# Patient Record
Sex: Male | Born: 1945 | Race: White | Hispanic: No | Marital: Married | Smoking: Former smoker
Health system: Southern US, Community
[De-identification: ages and names within clinical notes are randomized; demographics above are authoritative.]

## PROBLEM LIST (undated history)

## (undated) DIAGNOSIS — R0600 Dyspnea, unspecified: Secondary | ICD-10-CM

## (undated) DIAGNOSIS — K8020 Calculus of gallbladder without cholecystitis without obstruction: Secondary | ICD-10-CM

## (undated) DIAGNOSIS — E785 Hyperlipidemia, unspecified: Secondary | ICD-10-CM

## (undated) DIAGNOSIS — I219 Acute myocardial infarction, unspecified: Secondary | ICD-10-CM

## (undated) DIAGNOSIS — L0390 Cellulitis, unspecified: Secondary | ICD-10-CM

## (undated) DIAGNOSIS — I2510 Atherosclerotic heart disease of native coronary artery without angina pectoris: Secondary | ICD-10-CM

## (undated) DIAGNOSIS — C61 Malignant neoplasm of prostate: Secondary | ICD-10-CM

## (undated) DIAGNOSIS — I739 Peripheral vascular disease, unspecified: Secondary | ICD-10-CM

## (undated) DIAGNOSIS — I10 Essential (primary) hypertension: Secondary | ICD-10-CM

## (undated) DIAGNOSIS — K5792 Diverticulitis of intestine, part unspecified, without perforation or abscess without bleeding: Secondary | ICD-10-CM

## (undated) HISTORY — PX: TONSILLECTOMY: SUR1361

## (undated) HISTORY — DX: Essential (primary) hypertension: I10

## (undated) HISTORY — DX: Dyspnea, unspecified: R06.00

## (undated) HISTORY — PX: CHOLECYSTECTOMY: SHX55

## (undated) HISTORY — DX: Hyperlipidemia, unspecified: E78.5

## (undated) HISTORY — DX: Cellulitis, unspecified: L03.90

## (undated) HISTORY — PX: PROSTATE SURGERY: SHX751

## (undated) HISTORY — DX: Acute myocardial infarction, unspecified: I21.9

## (undated) HISTORY — DX: Peripheral vascular disease, unspecified: I73.9

## (undated) HISTORY — DX: Calculus of gallbladder without cholecystitis without obstruction: K80.20

## (undated) HISTORY — DX: Malignant neoplasm of prostate: C61

## (undated) HISTORY — DX: Diverticulitis of intestine, part unspecified, without perforation or abscess without bleeding: K57.92

## (undated) HISTORY — PX: PENILE PROSTHESIS  REMOVAL: SHX2202

## (undated) HISTORY — DX: Atherosclerotic heart disease of native coronary artery without angina pectoris: I25.10

---

## 1986-11-14 HISTORY — PX: CERVICAL DISC SURGERY: SHX588

## 1999-05-26 ENCOUNTER — Ambulatory Visit (HOSPITAL_COMMUNITY): Admission: RE | Admit: 1999-05-26 | Discharge: 1999-05-26 | Payer: Self-pay | Admitting: *Deleted

## 1999-05-26 ENCOUNTER — Encounter: Payer: Self-pay | Admitting: *Deleted

## 2000-08-09 ENCOUNTER — Encounter: Payer: Self-pay | Admitting: Urology

## 2000-08-09 ENCOUNTER — Encounter: Admission: RE | Admit: 2000-08-09 | Discharge: 2000-08-09 | Payer: Self-pay | Admitting: Urology

## 2002-05-14 ENCOUNTER — Inpatient Hospital Stay (HOSPITAL_COMMUNITY): Admission: EM | Admit: 2002-05-14 | Discharge: 2002-05-18 | Payer: Self-pay | Admitting: Emergency Medicine

## 2002-05-15 ENCOUNTER — Encounter: Payer: Self-pay | Admitting: Internal Medicine

## 2002-05-16 ENCOUNTER — Encounter: Payer: Self-pay | Admitting: Internal Medicine

## 2005-02-02 ENCOUNTER — Ambulatory Visit: Payer: Self-pay | Admitting: Cardiology

## 2005-02-08 ENCOUNTER — Ambulatory Visit: Payer: Self-pay

## 2005-02-11 ENCOUNTER — Ambulatory Visit: Payer: Self-pay

## 2005-05-18 ENCOUNTER — Inpatient Hospital Stay (HOSPITAL_COMMUNITY): Admission: RE | Admit: 2005-05-18 | Discharge: 2005-05-19 | Payer: Self-pay | Admitting: Urology

## 2005-08-15 ENCOUNTER — Ambulatory Visit: Payer: Self-pay | Admitting: Cardiology

## 2005-08-16 ENCOUNTER — Ambulatory Visit: Payer: Self-pay | Admitting: Cardiology

## 2005-12-25 ENCOUNTER — Inpatient Hospital Stay (HOSPITAL_COMMUNITY): Admission: EM | Admit: 2005-12-25 | Discharge: 2005-12-30 | Payer: Self-pay | Admitting: Emergency Medicine

## 2005-12-26 ENCOUNTER — Ambulatory Visit: Payer: Self-pay | Admitting: Internal Medicine

## 2005-12-30 ENCOUNTER — Encounter: Payer: Self-pay | Admitting: Internal Medicine

## 2006-01-03 ENCOUNTER — Ambulatory Visit: Payer: Self-pay | Admitting: Internal Medicine

## 2006-01-10 ENCOUNTER — Ambulatory Visit: Payer: Self-pay | Admitting: Internal Medicine

## 2006-01-16 ENCOUNTER — Encounter: Admission: RE | Admit: 2006-01-16 | Discharge: 2006-04-16 | Payer: Self-pay | Admitting: Internal Medicine

## 2006-01-24 ENCOUNTER — Ambulatory Visit: Payer: Self-pay | Admitting: Internal Medicine

## 2006-02-07 ENCOUNTER — Ambulatory Visit: Payer: Self-pay | Admitting: Internal Medicine

## 2006-05-04 ENCOUNTER — Ambulatory Visit: Payer: Self-pay | Admitting: Internal Medicine

## 2006-05-19 ENCOUNTER — Ambulatory Visit: Payer: Self-pay | Admitting: Internal Medicine

## 2007-01-23 ENCOUNTER — Ambulatory Visit: Payer: Self-pay | Admitting: Internal Medicine

## 2007-01-23 LAB — CONVERTED CEMR LAB: Triglycerides: 85 mg/dL (ref 0–149)

## 2007-01-25 ENCOUNTER — Ambulatory Visit: Payer: Self-pay | Admitting: Cardiology

## 2007-02-14 ENCOUNTER — Ambulatory Visit: Payer: Self-pay

## 2007-03-07 ENCOUNTER — Ambulatory Visit: Payer: Self-pay | Admitting: Internal Medicine

## 2007-04-17 ENCOUNTER — Telehealth (INDEPENDENT_AMBULATORY_CARE_PROVIDER_SITE_OTHER): Payer: Self-pay | Admitting: *Deleted

## 2007-06-05 ENCOUNTER — Encounter (INDEPENDENT_AMBULATORY_CARE_PROVIDER_SITE_OTHER): Payer: Self-pay | Admitting: *Deleted

## 2007-06-21 ENCOUNTER — Ambulatory Visit: Payer: Self-pay | Admitting: Internal Medicine

## 2007-06-21 DIAGNOSIS — I10 Essential (primary) hypertension: Secondary | ICD-10-CM | POA: Insufficient documentation

## 2007-06-21 DIAGNOSIS — E785 Hyperlipidemia, unspecified: Secondary | ICD-10-CM | POA: Insufficient documentation

## 2007-07-07 LAB — CONVERTED CEMR LAB
ALT: 32 units/L (ref 0–53)
Basophils Absolute: 0 10*3/uL (ref 0.0–0.1)
Bilirubin, Direct: 0.2 mg/dL (ref 0.0–0.3)
Cholesterol: 99 mg/dL (ref 0–200)
Eosinophils Absolute: 0.2 10*3/uL (ref 0.0–0.6)
LDL Cholesterol: 51 mg/dL (ref 0–99)
MCHC: 34.3 g/dL (ref 30.0–36.0)
MCV: 93.3 fL (ref 78.0–100.0)
Monocytes Relative: 5.7 % (ref 3.0–11.0)
Neutro Abs: 3.6 10*3/uL (ref 1.4–7.7)
Neutrophils Relative %: 67.5 % (ref 43.0–77.0)
RBC: 3.86 M/uL — ABNORMAL LOW (ref 4.22–5.81)
RDW: 12.8 % (ref 11.5–14.6)
Total CHOL/HDL Ratio: 2.9
VLDL: 14 mg/dL (ref 0–40)
WBC: 5.3 10*3/uL (ref 4.5–10.5)

## 2007-07-09 ENCOUNTER — Encounter (INDEPENDENT_AMBULATORY_CARE_PROVIDER_SITE_OTHER): Payer: Self-pay | Admitting: *Deleted

## 2007-09-07 ENCOUNTER — Encounter: Payer: Self-pay | Admitting: Internal Medicine

## 2007-09-21 ENCOUNTER — Ambulatory Visit: Payer: Self-pay

## 2007-12-07 ENCOUNTER — Encounter: Payer: Self-pay | Admitting: Internal Medicine

## 2008-02-06 ENCOUNTER — Encounter: Payer: Self-pay | Admitting: Internal Medicine

## 2008-02-29 ENCOUNTER — Ambulatory Visit: Payer: Self-pay | Admitting: Cardiovascular Disease

## 2008-04-10 ENCOUNTER — Ambulatory Visit: Payer: Self-pay

## 2008-04-16 ENCOUNTER — Encounter: Payer: Self-pay | Admitting: Internal Medicine

## 2008-04-17 ENCOUNTER — Ambulatory Visit: Payer: Self-pay

## 2008-05-15 ENCOUNTER — Encounter: Payer: Self-pay | Admitting: Internal Medicine

## 2008-05-19 ENCOUNTER — Ambulatory Visit (HOSPITAL_COMMUNITY): Admission: RE | Admit: 2008-05-19 | Discharge: 2008-05-20 | Payer: Self-pay | Admitting: Urology

## 2008-06-10 ENCOUNTER — Encounter: Payer: Self-pay | Admitting: Internal Medicine

## 2008-06-24 ENCOUNTER — Telehealth (INDEPENDENT_AMBULATORY_CARE_PROVIDER_SITE_OTHER): Payer: Self-pay | Admitting: *Deleted

## 2008-07-11 ENCOUNTER — Encounter: Payer: Self-pay | Admitting: Internal Medicine

## 2008-07-23 ENCOUNTER — Ambulatory Visit (HOSPITAL_COMMUNITY): Admission: RE | Admit: 2008-07-23 | Discharge: 2008-07-23 | Payer: Self-pay | Admitting: Urology

## 2008-08-05 ENCOUNTER — Telehealth (INDEPENDENT_AMBULATORY_CARE_PROVIDER_SITE_OTHER): Payer: Self-pay | Admitting: *Deleted

## 2008-08-06 ENCOUNTER — Telehealth (INDEPENDENT_AMBULATORY_CARE_PROVIDER_SITE_OTHER): Payer: Self-pay | Admitting: *Deleted

## 2008-08-07 ENCOUNTER — Ambulatory Visit: Payer: Self-pay | Admitting: Internal Medicine

## 2008-08-07 LAB — CONVERTED CEMR LAB
Hgb A1c MFr Bld: 6 % (ref 4.6–6.0)
Iron: 81 ug/dL (ref 42–165)
Lymphocytes Relative: 26.1 % (ref 12.0–46.0)
MCHC: 35.4 g/dL (ref 30.0–36.0)
MCV: 92.3 fL (ref 78.0–100.0)
Monocytes Absolute: 0.4 10*3/uL (ref 0.1–1.0)
Neutrophils Relative %: 61.4 % (ref 43.0–77.0)
RBC: 4.23 M/uL (ref 4.22–5.81)
RDW: 12.8 % (ref 11.5–14.6)
WBC: 5.9 10*3/uL (ref 4.5–10.5)

## 2008-08-12 ENCOUNTER — Ambulatory Visit (HOSPITAL_BASED_OUTPATIENT_CLINIC_OR_DEPARTMENT_OTHER): Admission: RE | Admit: 2008-08-12 | Discharge: 2008-08-12 | Payer: Self-pay | Admitting: Urology

## 2008-08-14 ENCOUNTER — Ambulatory Visit: Payer: Self-pay | Admitting: Internal Medicine

## 2008-08-14 DIAGNOSIS — C7951 Secondary malignant neoplasm of bone: Secondary | ICD-10-CM

## 2008-08-14 DIAGNOSIS — R0989 Other specified symptoms and signs involving the circulatory and respiratory systems: Secondary | ICD-10-CM | POA: Insufficient documentation

## 2008-08-14 DIAGNOSIS — G4730 Sleep apnea, unspecified: Secondary | ICD-10-CM | POA: Insufficient documentation

## 2008-08-14 DIAGNOSIS — C61 Malignant neoplasm of prostate: Secondary | ICD-10-CM

## 2008-08-14 DIAGNOSIS — E1121 Type 2 diabetes mellitus with diabetic nephropathy: Secondary | ICD-10-CM

## 2008-08-14 DIAGNOSIS — R0609 Other forms of dyspnea: Secondary | ICD-10-CM

## 2008-09-04 ENCOUNTER — Ambulatory Visit: Payer: Self-pay | Admitting: Cardiovascular Disease

## 2008-11-17 ENCOUNTER — Encounter: Payer: Self-pay | Admitting: Internal Medicine

## 2008-12-15 ENCOUNTER — Telehealth: Payer: Self-pay | Admitting: Internal Medicine

## 2009-01-15 ENCOUNTER — Ambulatory Visit: Payer: Self-pay | Admitting: Cardiovascular Disease

## 2009-01-15 ENCOUNTER — Encounter: Payer: Self-pay | Admitting: Cardiovascular Disease

## 2009-01-15 DIAGNOSIS — I2119 ST elevation (STEMI) myocardial infarction involving other coronary artery of inferior wall: Secondary | ICD-10-CM | POA: Insufficient documentation

## 2009-01-15 DIAGNOSIS — R609 Edema, unspecified: Secondary | ICD-10-CM

## 2009-02-11 ENCOUNTER — Ambulatory Visit: Payer: Self-pay | Admitting: Cardiovascular Disease

## 2009-02-11 LAB — CONVERTED CEMR LAB
CO2: 33 meq/L — ABNORMAL HIGH (ref 19–32)
Calcium: 8.9 mg/dL (ref 8.4–10.5)
Chloride: 105 meq/L (ref 96–112)
GFR calc non Af Amer: 103.24 mL/min (ref >60)
Sodium: 145 meq/L (ref 135–145)

## 2009-02-26 ENCOUNTER — Telehealth (INDEPENDENT_AMBULATORY_CARE_PROVIDER_SITE_OTHER): Payer: Self-pay | Admitting: *Deleted

## 2009-03-02 ENCOUNTER — Ambulatory Visit (HOSPITAL_COMMUNITY): Admission: RE | Admit: 2009-03-02 | Discharge: 2009-03-03 | Payer: Self-pay | Admitting: Urology

## 2009-03-17 ENCOUNTER — Encounter: Payer: Self-pay | Admitting: Cardiovascular Disease

## 2009-03-17 ENCOUNTER — Encounter: Payer: Self-pay | Admitting: Internal Medicine

## 2009-04-16 ENCOUNTER — Encounter: Payer: Self-pay | Admitting: Cardiovascular Disease

## 2009-04-16 ENCOUNTER — Encounter: Payer: Self-pay | Admitting: Internal Medicine

## 2009-04-17 ENCOUNTER — Telehealth: Payer: Self-pay | Admitting: Cardiovascular Disease

## 2009-04-20 ENCOUNTER — Telehealth: Payer: Self-pay | Admitting: Cardiovascular Disease

## 2009-04-20 ENCOUNTER — Telehealth (INDEPENDENT_AMBULATORY_CARE_PROVIDER_SITE_OTHER): Payer: Self-pay | Admitting: *Deleted

## 2009-04-22 ENCOUNTER — Ambulatory Visit: Payer: Self-pay | Admitting: Internal Medicine

## 2009-04-26 LAB — CONVERTED CEMR LAB: Hgb A1c MFr Bld: 6.4 % (ref 4.6–6.5)

## 2009-04-27 ENCOUNTER — Ambulatory Visit: Payer: Self-pay | Admitting: Internal Medicine

## 2009-05-04 ENCOUNTER — Telehealth: Payer: Self-pay | Admitting: Cardiovascular Disease

## 2009-06-01 ENCOUNTER — Telehealth: Payer: Self-pay | Admitting: Cardiovascular Disease

## 2009-06-01 ENCOUNTER — Telehealth (INDEPENDENT_AMBULATORY_CARE_PROVIDER_SITE_OTHER): Payer: Self-pay | Admitting: *Deleted

## 2009-06-03 ENCOUNTER — Telehealth (INDEPENDENT_AMBULATORY_CARE_PROVIDER_SITE_OTHER): Payer: Self-pay | Admitting: *Deleted

## 2009-06-15 ENCOUNTER — Telehealth: Payer: Self-pay | Admitting: Cardiovascular Disease

## 2009-06-17 ENCOUNTER — Encounter: Payer: Self-pay | Admitting: Cardiovascular Disease

## 2009-06-17 ENCOUNTER — Encounter: Payer: Self-pay | Admitting: Internal Medicine

## 2009-07-09 ENCOUNTER — Encounter: Payer: Self-pay | Admitting: Cardiovascular Disease

## 2009-07-09 ENCOUNTER — Encounter: Payer: Self-pay | Admitting: Internal Medicine

## 2009-07-23 ENCOUNTER — Telehealth (INDEPENDENT_AMBULATORY_CARE_PROVIDER_SITE_OTHER): Payer: Self-pay | Admitting: *Deleted

## 2009-07-27 ENCOUNTER — Telehealth: Payer: Self-pay | Admitting: Cardiovascular Disease

## 2009-07-28 ENCOUNTER — Telehealth: Payer: Self-pay | Admitting: Cardiovascular Disease

## 2009-07-29 ENCOUNTER — Telehealth: Payer: Self-pay | Admitting: Cardiovascular Disease

## 2009-08-03 ENCOUNTER — Telehealth (INDEPENDENT_AMBULATORY_CARE_PROVIDER_SITE_OTHER): Payer: Self-pay | Admitting: *Deleted

## 2009-08-07 ENCOUNTER — Encounter: Payer: Self-pay | Admitting: Internal Medicine

## 2009-08-18 ENCOUNTER — Ambulatory Visit: Payer: Self-pay | Admitting: Internal Medicine

## 2009-08-19 ENCOUNTER — Encounter (INDEPENDENT_AMBULATORY_CARE_PROVIDER_SITE_OTHER): Payer: Self-pay | Admitting: *Deleted

## 2009-08-19 LAB — CONVERTED CEMR LAB: Hgb A1c MFr Bld: 6.1 % (ref 4.6–6.5)

## 2009-08-24 ENCOUNTER — Ambulatory Visit: Payer: Self-pay | Admitting: Internal Medicine

## 2009-08-24 DIAGNOSIS — M2550 Pain in unspecified joint: Secondary | ICD-10-CM | POA: Insufficient documentation

## 2009-08-26 ENCOUNTER — Encounter: Payer: Self-pay | Admitting: Cardiovascular Disease

## 2009-08-26 DIAGNOSIS — I739 Peripheral vascular disease, unspecified: Secondary | ICD-10-CM | POA: Insufficient documentation

## 2009-08-26 LAB — CONVERTED CEMR LAB: Rhuematoid fact SerPl-aCnc: <20 intl units/mL (ref 0.0–20.0)

## 2009-08-27 ENCOUNTER — Encounter (INDEPENDENT_AMBULATORY_CARE_PROVIDER_SITE_OTHER): Payer: Self-pay | Admitting: *Deleted

## 2009-08-27 ENCOUNTER — Ambulatory Visit: Payer: Self-pay | Admitting: Cardiovascular Disease

## 2009-08-27 DIAGNOSIS — I4510 Unspecified right bundle-branch block: Secondary | ICD-10-CM

## 2009-09-09 ENCOUNTER — Telehealth (INDEPENDENT_AMBULATORY_CARE_PROVIDER_SITE_OTHER): Payer: Self-pay | Admitting: *Deleted

## 2009-09-25 ENCOUNTER — Telehealth: Payer: Self-pay | Admitting: Cardiovascular Disease

## 2009-09-25 ENCOUNTER — Encounter (INDEPENDENT_AMBULATORY_CARE_PROVIDER_SITE_OTHER): Payer: Self-pay | Admitting: *Deleted

## 2009-09-30 ENCOUNTER — Encounter (INDEPENDENT_AMBULATORY_CARE_PROVIDER_SITE_OTHER): Payer: Self-pay | Admitting: *Deleted

## 2009-09-30 ENCOUNTER — Ambulatory Visit: Payer: Self-pay | Admitting: Cardiovascular Disease

## 2009-09-30 LAB — CONVERTED CEMR LAB
ALT: 31 units/L (ref 0–53)
AST: 25 units/L (ref 0–37)
BUN: 19 mg/dL (ref 6–23)
CO2: 32 meq/L (ref 19–32)
Chloride: 100 meq/L (ref 96–112)
Glucose, Bld: 131 mg/dL — ABNORMAL HIGH (ref 70–99)
LDL Cholesterol: 75 mg/dL (ref 0–99)
Potassium: 3.9 meq/L (ref 3.5–5.1)
Sodium: 142 meq/L (ref 135–145)
Total Bilirubin: 0.8 mg/dL (ref 0.3–1.2)
Total CHOL/HDL Ratio: 4
Total Protein: 6.3 g/dL (ref 6.0–8.3)
VLDL: 27 mg/dL (ref 0.0–40.0)

## 2009-11-25 ENCOUNTER — Telehealth (INDEPENDENT_AMBULATORY_CARE_PROVIDER_SITE_OTHER): Payer: Self-pay | Admitting: *Deleted

## 2009-11-30 ENCOUNTER — Encounter: Payer: Self-pay | Admitting: Cardiovascular Disease

## 2009-12-24 ENCOUNTER — Telehealth (INDEPENDENT_AMBULATORY_CARE_PROVIDER_SITE_OTHER): Payer: Self-pay | Admitting: *Deleted

## 2010-01-01 ENCOUNTER — Telehealth (INDEPENDENT_AMBULATORY_CARE_PROVIDER_SITE_OTHER): Payer: Self-pay | Admitting: *Deleted

## 2010-01-18 ENCOUNTER — Encounter: Payer: Self-pay | Admitting: Internal Medicine

## 2010-01-18 ENCOUNTER — Encounter: Payer: Self-pay | Admitting: Cardiovascular Disease

## 2010-01-21 ENCOUNTER — Encounter: Payer: Self-pay | Admitting: Internal Medicine

## 2010-02-01 ENCOUNTER — Telehealth (INDEPENDENT_AMBULATORY_CARE_PROVIDER_SITE_OTHER): Payer: Self-pay | Admitting: *Deleted

## 2010-02-15 ENCOUNTER — Telehealth (INDEPENDENT_AMBULATORY_CARE_PROVIDER_SITE_OTHER): Payer: Self-pay | Admitting: *Deleted

## 2010-02-17 ENCOUNTER — Ambulatory Visit: Payer: Self-pay | Admitting: Internal Medicine

## 2010-02-17 ENCOUNTER — Telehealth (INDEPENDENT_AMBULATORY_CARE_PROVIDER_SITE_OTHER): Payer: Self-pay | Admitting: *Deleted

## 2010-02-24 ENCOUNTER — Ambulatory Visit: Payer: Self-pay | Admitting: Internal Medicine

## 2010-03-23 ENCOUNTER — Encounter: Payer: Self-pay | Admitting: Internal Medicine

## 2010-04-13 ENCOUNTER — Telehealth: Payer: Self-pay | Admitting: Cardiovascular Disease

## 2010-05-05 ENCOUNTER — Telehealth (INDEPENDENT_AMBULATORY_CARE_PROVIDER_SITE_OTHER): Payer: Self-pay | Admitting: *Deleted

## 2010-05-26 ENCOUNTER — Encounter: Payer: Self-pay | Admitting: Internal Medicine

## 2010-05-31 ENCOUNTER — Telehealth: Payer: Self-pay | Admitting: Cardiovascular Disease

## 2010-07-14 ENCOUNTER — Telehealth: Payer: Self-pay | Admitting: Cardiovascular Disease

## 2010-07-20 ENCOUNTER — Encounter: Payer: Self-pay | Admitting: Cardiovascular Disease

## 2010-07-21 ENCOUNTER — Telehealth: Payer: Self-pay | Admitting: Cardiovascular Disease

## 2010-08-25 ENCOUNTER — Ambulatory Visit (HOSPITAL_COMMUNITY): Admission: RE | Admit: 2010-08-25 | Discharge: 2010-08-25 | Payer: Self-pay | Admitting: Urology

## 2010-08-26 ENCOUNTER — Ambulatory Visit: Payer: Self-pay | Admitting: Cardiovascular Disease

## 2010-08-30 ENCOUNTER — Encounter: Payer: Self-pay | Admitting: Internal Medicine

## 2010-09-30 ENCOUNTER — Telehealth: Payer: Self-pay | Admitting: Cardiovascular Disease

## 2010-10-14 ENCOUNTER — Telehealth: Payer: Self-pay | Admitting: Cardiovascular Disease

## 2010-10-25 ENCOUNTER — Telehealth: Payer: Self-pay | Admitting: Cardiovascular Disease

## 2010-12-14 ENCOUNTER — Encounter: Payer: Self-pay | Admitting: Internal Medicine

## 2010-12-14 NOTE — Progress Notes (Signed)
Summary: refill  Phone Note Refill Request Message from:  Patient on Apr 13, 2010 9:33 AM  Refills Requested: Medication #1:  SIMVASTATIN 40 MG TABS once daily  Medication #2:  NIASPAN 1000 MG  TBCR 2 by mouth qd pt is out town need it sent [facility] [phone] pt is out medications would like a call at [phone]  Initial call taken by: Judie Grieve,  Apr 13, 2010 9:35 AM    Prescriptions: NIASPAN 1000 MG  TBCR (NIACIN (ANTIHYPERLIPIDEMIC)) 2 by mouth qd  #60 x 12   Entered by:   Burnett Kanaris, CNA   Authorized by:   Colon Branch, MD, [facility]   Signed by:   Burnett Kanaris, CNA on 04/13/2010   Method used:   Electronically to        [facility] [address] # [phone]* (retail)       [address] [place], [place]  [address]       Ph: [phone]       Fax: [phone]   RxID:   [id number] SIMVASTATIN 40 MG TABS (SIMVASTATIN) once daily  #90 x 3   Entered by:   Burnett Kanaris, CNA   Authorized by:   Colon Branch, MD, [facility]   Signed by:   Burnett Kanaris, CNA on 04/13/2010   Method used:   Electronically to        [facility] # [phone]* (retail)       [address] [place], [place]  [address]       Ph: [phone]       Fax: [phone]   RxID:   [id number]

## 2010-12-14 NOTE — Letter (Signed)
[facility]: [facility]   Imported By: Marylou Mccoy 05/18/2010 10:55:24  _____________________________________________________________________  External Attachment:    Type:   Image     Comment:   External Document

## 2010-12-14 NOTE — Medication Information (Signed)
Summary: Pravastatin Sodium 40 mg  Pravastatin Sodium 40 mg   Imported By: Marylou Mccoy 07/29/2010 15:35:48  _____________________________________________________________________  External Attachment:    Type:   Image     Comment:   External Document

## 2010-12-14 NOTE — Progress Notes (Signed)
  Walk in Patient Form Recieved " Pt filled out ROI for Stress to be faxed to [facility]" I faxed Stress over  [facility]  May 05, 2010 11:17 AM

## 2010-12-14 NOTE — Letter (Signed)
Summary: [facility]   Imported By: Roderic Ovens 12/15/2009 13:02:43  _____________________________________________________________________  External Attachment:    Type:   Image     Comment:   External Document

## 2010-12-14 NOTE — Letter (Signed)
Summary: [facility]  [facility]   Imported By: Lanelle Bal 02/01/2010 14:07:41  _____________________________________________________________________  External Attachment:    Type:   Image     Comment:   External Document

## 2010-12-14 NOTE — Letter (Signed)
Summary: [facility]  [facility]   Imported By: Lanelle Bal 09/07/2010 10:35:47  _____________________________________________________________________  External Attachment:    Type:   Image     Comment:   External Document

## 2010-12-14 NOTE — Progress Notes (Signed)
Summary: Question about meds  Phone Note From Pharmacy Call back at [phone]   [id number] Summary of Call: [facility] two meds Amilodipine/Simvastatin Initial call taken by: Judie Grieve,  July 14, 2010 2:53 PM  Follow-up for Phone Call        left message for matt, will foward to dr Eden Emms for his review Deliah Goody, RN  July 14, 2010 5:14 PM   Additional Follow-up for Phone Call Additional follow up Details #1::        continue norvasc 10 mg and can change statin to pravachol 40mg  Additional Follow-up by: Colon Branch, MD, [facility],  July 18, 2010 1:18 PM    Additional Follow-up for Phone Call Additional follow up Details #2::    per pt calling back regarding medication. [phone] Lorne Skeens  July 20, 2010 10:58 AM   pt aware of med change Deliah Goody, RN  July 20, 2010 11:05 AM   New/Updated Medications: PRAVASTATIN SODIUM 40 MG TABS (PRAVASTATIN SODIUM) Take one tablet by mouth daily at bedtime Prescriptions: PRAVASTATIN SODIUM 40 MG TABS (PRAVASTATIN SODIUM) Take one tablet by mouth daily at bedtime  #30 x 12   Entered by:   Deliah Goody, RN   Authorized by:   Colon Branch, MD, [facility]   Signed by:   Deliah Goody, RN on 07/20/2010   Method used:   [facility]ron[facility] to        [facility] # [id number]* (retail)       [address] [place], [place]  [address]       Ph: [phone]       Fax: [phone]   RxID:   [id number]

## 2010-12-14 NOTE — Letter (Signed)
Summary: Marlaine Hind OD  Marlaine Hind OD   Imported By: Lanelle Bal 04/22/2010 13:05:11  _____________________________________________________________________  External Attachment:    Type:   Image     Comment:   External Document

## 2010-12-14 NOTE — Progress Notes (Signed)
Summary: STATUS ON DISABILITY FORM  Phone Note Other Incoming Call back at [phone]   Caller: ALICIA W/ [employer] (PT REPRESENTATIVE FOR DISABILITY) Summary of Call: ALICIA FROM [employer] (THE PATIENT'S REP WHO IS HELPING HIM APPLY FOR DISABILITY) IS CALLING.  TWICE, THEY HAVE FAXED OVER A BLANK FORM CALLED "PHYSICAL CAPICITY EVALUATION, TOTALING 4 PAGES, TO BE COMPLETED BY DR. HOPPER.  SHE STATES THE FIRST TIME THE FORM WAS SENT BACK TO THEM, BUT WAS BLANK.  NOW INQUIRING IF DR. HOPPER HAS THIS FORM & THE STATUS ON  ITS COMPLETION.  WHEN RETURNING [employer]'S CALL @ [phone], MUST REFERENCE  REQUEST [phone]. Initial call taken by: Magdalen Spatz [facility],  November 25, 2009 3:26 PM  Follow-up for Phone Call        Dr.Hopper have you seen a form on this patient? I an not familiar with any forms sent on this patient  Follow-up by: Shonna Chock,  November 25, 2009 3:48 PM  Additional Follow-up for Phone Call Additional follow up Details #1::        this will require Physical Medicine Specialist  consultation to assess physical restrictions. Does he want to pursue?(FAX this to him) Additional Follow-up by: Marga Melnick MD,  November 25, 2009 5:18 PM    Additional Follow-up for Phone Call Additional follow up Details #2::    Spoke with patient, patient said he was told by [facility] that they have all the information needed and they asked him for all his Dr.'s Name and he is not sure what they need from [place]  but for me to contact them and then get back in touch with him and let him know what to do.  I informed patient I will futher follow-up with [facility] tomorrow, ok'd.Shonna Chock  November 25, 2009 5:29 PM    Called and left a message for Helmut Muster to fax over paperwork to [phone], we have not recieved anything as of right now. Shonna Chock  November 26, 2009 8:12 AM   Additional Follow-up for Phone Call Additional follow up Details #3:: Details for Additional Follow-up Action Taken: Spoke with Melissa @  [facility] and she said she doesnt know what happened with the previous message that I left but that she would go ahead and fax over another copy of form to be completed. We will review and see if patient needs to be referred in order for form to be completed./Chrae [facility]  November 27, 2009 5:24 PM   Papers were received, per Dr.Hopper patient needs to be referred to Physical Medicine for Evaluation.  I contacted patient and informed him and he ok'd. I also indicated on papers received from [facility] and faxed them back to [phone]. Marland KitchenShonna Chock  December 01, 2009 10:56 AM   I spoke with Morrie Sheldon at [place] and she said if the primary doesnt fill it out they prefer that patient not be referred to Physical Med because that is usually very expensive for the patient and we can VOID paperwork. I called and informed patient and he was glad and ok'd.Shonna Chock  December 01, 2009 11:26 AM

## 2010-12-14 NOTE — Progress Notes (Signed)
Summary: Need 90 supply of medication  Phone Note Call from Patient Call back at Home Phone [phone]   Caller: Patient Summary of Call: Pt need 90 supply for Pravastatin  due to insurance and pt want a call when this is done Initial call taken by: Judie Grieve,  July 21, 2010 12:03 PM  Follow-up for Phone Call        gave pt refills and also called him back Follow-up by: Kem Parkinson,  July 21, 2010 1:25 PM    Prescriptions: PRAVASTATIN SODIUM 40 MG TABS (PRAVASTATIN SODIUM) Take one tablet by mouth daily at bedtime  #90 x 3   Entered by:   Kem Parkinson   Authorized by:   Colon Branch, MD, [facility]   Signed by:   Kem Parkinson on 07/21/2010   Method used:   [facility]        [facility] # [id number]* (retail)       [address] [place], [place]  [address]       Ph: [phone]       Fax: [phone]   RxID:   [id number]

## 2010-12-14 NOTE — Progress Notes (Signed)
  Pt Dropped off [facility] Forwarded to [employer] [facility]  February 01, 2010 2:11 PM

## 2010-12-14 NOTE — Progress Notes (Signed)
Summary: REFILL REQUEST  Phone Note Refill Request Message from:  Patient on May 31, 2010 10:25 AM  Refills Requested: Medication #1:  LABETALOL HCL 300 MG  TABS 2 by mouth qd  Medication #2:  DOXAZOSIN MESYLATE 4 MG TABS 1 by mouth qd pt would like a call when called in please at [phone] said last time he requested a call he didn't get one   Method Requested: Telephone to Pharmacy Initial call taken by: Glynda Jaeger,  May 31, 2010 10:26 AM  Follow-up for Phone Call        I called pt to let him no i sent in the meds Follow-up by: Kem Parkinson,  May 31, 2010 10:47 AM    Prescriptions: LABETALOL HCL 300 MG  TABS (LABETALOL HCL) 2 by mouth qd  #180 Tablet x 3   Entered by:   Kem Parkinson   Authorized by:   Colon Branch, MD, [facility]   Signed by:   Kem Parkinson on 05/31/2010   Method used:   Electronically to        [facility] # [phone]* (retail)       [address] [place], [place]  [address]       Ph: [phone]       Fax: [phone]   RxID:   [id number] DOXAZOSIN MESYLATE 4 MG TABS (DOXAZOSIN MESYLATE) 1 by mouth qd  #90 x 3   Entered by:   Kem Parkinson   Authorized by:   Colon Branch, MD, [facility]   Signed by:   Kem Parkinson on 05/31/2010   Method used:   Electronically to        [facility] # [phone]* (retail)       [address] [place], [place]  [address]       Ph: [phone]       Fax: [phone]   RxID:   [id number]

## 2010-12-14 NOTE — Progress Notes (Signed)
Summary: refill  Phone Note Refill Request Call back at Home Phone [phone] Message from:  Patient on February 15, 2010 12:53 PM  Refills Requested: Medication #1:  ONETOUCH TEST  STRP Check bloodsugar daily Please call when its done   Method Requested: Telephone to Pharmacy Next Appointment Scheduled: No future Appts. Initial call taken by: Barnie Mort,  February 15, 2010 12:55 PM    Prescriptions: ONETOUCH TEST  STRP (GLUCOSE BLOOD) Check bloodsugar daily, DX: 250.00  #100 x 3   Entered by:   Shonna Chock   Authorized by:   Marga Melnick MD   Signed by:   Shonna Chock on 02/15/2010   Method used:   Electronically to        [facility] # [phone]* (retail)       [address] [place], [place]  [address]       Ph: [phone]       Fax: [phone]   RxID:   [id number]   Appended Document: refill Patient aware rx sent in

## 2010-12-14 NOTE — Progress Notes (Signed)
Summary: refill request  Phone Note Refill Request   Refills Requested: Medication #1:  PRAVASTATIN SODIUM 40 MG TABS Take one tablet by mouth daily at bedtime [facility]/req 90 day supply only way insurance will cover   Method Requested: Telephone to Pharmacy Initial call taken by: Glynda Jaeger,  October 14, 2010 8:55 AM    Prescriptions: PRAVASTATIN SODIUM 40 MG TABS (PRAVASTATIN SODIUM) Take one tablet by mouth daily at bedtime  #90 x 3   Entered by:   Kem Parkinson   Authorized by:   Colon Branch, MD, [facility]   Signed by:   Kem Parkinson on 10/14/2010   Method used:   Electronically to        [facility] # [phone]* (retail)       [address] [place], [place]  [address]       Ph: [phone]       Fax: [phone]   RxID:   [id number]

## 2010-12-14 NOTE — Progress Notes (Signed)
Summary: Refill request  Phone Note Call from Patient Call back at Home Phone [phone]   Caller: Patient Summary of Call: Message left on Vm: Patient would like refill on DM supplies: lancets and one touch ultra test srtips # 100  I spoke with patient, verified pharmacy and filled DM supplies  .Shonna Chock  December 24, 2009 10:23 AM     Prescriptions: ONETOUCH LANCETS  MISC (LANCETS) AS DIRECTED  #100 x 3   Entered by:   Shonna Chock   Authorized by:   Marga Melnick MD   Signed by:   Shonna Chock on 12/24/2009   Method used:   Electronically to        [facility] # [phone]* (retail)       [address] [place], [place]  [address]       Ph: [phone]       Fax: [phone]   RxID:   [id number] [facility] TEST  STRP (GLUCOSE BLOOD) CHECK BLOODSUGAR 1 X DAILY  #100 x 3   Entered by:   Shonna Chock   Authorized by:   Marga Melnick MD   Signed by:   Shonna Chock on 12/24/2009   Method used:   Electronically to        [facility] # [phone]* (retail)       [address] [place], [place]  [address]       Ph: [phone]       Fax: [phone]   RxID:   [id number]   Appended Document: Refill request    Prescriptions: ONETOUCH TEST  STRP (GLUCOSE BLOOD) Check bloodsugar daily, DX: 250.00  #100 x 3   Entered by:   Shonna Chock   Authorized by:   Marga Melnick MD   Signed by:   Shonna Chock on 12/24/2009   Method used:   Print then Give to Patient   RxID:   [id number] [facility] LANCETS  MISC (LANCETS) Check bloodsugar daily, DX: 250.00  #100 x 3   Entered by:   Shonna Chock   Authorized by:   Marga Melnick MD   Signed by:   Shonna Chock on 12/24/2009   Method used:   Print then Give to Patient   RxID:   [id number]

## 2010-12-14 NOTE — Letter (Signed)
Summary: [facility]  [facility]   Imported By: Lanelle Bal 01/26/2010 12:59:58  _____________________________________________________________________  External Attachment:    Type:   Image     Comment:   External Document

## 2010-12-14 NOTE — Progress Notes (Signed)
Summary: Lab Appointment concerns  Phone Note Call from Patient Call back at Home Phone [phone]   Caller: Patient Summary of Call: Message left on VM: Patient went to [facility] to get labs today and he said after several attempts to draw his blood he said no more and didnt get labs drawn. Patient would like a call to know if he should reschedule for tomorrow or what. Please Call   Marisue Ivan please contact patient and schedule appointment for labs at [facility] or Here . Inform patient to drink plenty of water prior to appointment-this may ease the blood draw./Chrae [facility]  February 17, 2010 12:11 PM   Follow-up for Phone Call        [email or web address].Harold Barban  February 17, 2010 12:50 PM  Additional Follow-up for Phone Call Additional follow up Details #1::        Patient is going back to [place] on 4.13.11. I informed his to drink plenty of water the day before and the morning off  Additional Follow-up by: Harold Barban,  February 17, 2010 1:36 PM

## 2010-12-14 NOTE — Letter (Signed)
Summary: [facility]  [facility]   Imported By: Lanelle Bal 06/10/2010 11:52:40  _____________________________________________________________________  External Attachment:    Type:   Image     Comment:   External Document

## 2010-12-14 NOTE — Progress Notes (Signed)
Summary: rx refill  Phone Note Refill Request Call back at Home Phone [phone] Message from:  Patient on September 30, 2010 8:18 AM  Refills Requested: Medication #1:  LOSARTAN POTASSIUM 50 MG TABS ONE TABLET BY MOUTH ONCE DAILY  Medication #2:  FUROSEMIDE 20 MG TABS 1 tab by mouth two times a day pt needs 90 days supply. [facility] [phone]   Method Requested: Telephone to Pharmacy Initial call taken by: Roe Coombs,  September 30, 2010 8:18 AM Caller: Patient Reason for Call: Talk to Nurse    Prescriptions: FUROSEMIDE 20 MG TABS (FUROSEMIDE) 1 tab by mouth two times a day  #180 x 3   Entered by:   Kem Parkinson   Authorized by:   Colon Branch, MD, [facility]   Signed by:   Kem Parkinson on 09/30/2010   Method used:   Electronically to        [facility] # [phone]* (retail)       [address] [place], [place]  [address]       Ph: [phone]       Fax: [phone]   RxID:   [id number] LOSARTAN POTASSIUM 50 MG TABS (LOSARTAN POTASSIUM) ONE TABLET BY MOUTH ONCE DAILY  #90 x 4   Entered by:   Kem Parkinson   Authorized by:   Colon Branch, MD, [facility]   Signed by:   Kem Parkinson on 09/30/2010   Method used:   Electronically to        [facility] # [id number]* (retail)       [address] [place], [place]  [address]       Ph: [phone]       Fax: [phone]   RxID:   [id number]

## 2010-12-14 NOTE — Progress Notes (Signed)
  Recieved papers form [facility] forwarded to [place] for processing [facility]  January 01, 2010 9:56 AM

## 2010-12-14 NOTE — Progress Notes (Signed)
Summary: refill/ pt is out medication want a call when called in.  Phone Note Refill Request Message from:  Patient on July 14, 2010 2:03 PM  Refills Requested: Medication #1:  NORVASC 10 MG  TABS 1 by mouth qd [facility] [phone]  Initial call taken by: Judie Grieve,  July 14, 2010 2:05 PM    Prescriptions: NORVASC 10 MG  TABS (AMLODIPINE BESYLATE) 1 by mouth qd  #90 Tablet x 3   Entered by:   Kem Parkinson   Authorized by:   Colon Branch, MD, [facility]   Signed by:   Kem Parkinson on 07/14/2010   Method used:   Electronically to        [facility] # [phone]* (retail)       [address] [place], [place]  [address]       Ph: [phone]       Fax: [phone]   RxID:   [id number]

## 2010-12-14 NOTE — Assessment & Plan Note (Signed)
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  He is overweight and has had multiple sphincter procedures and has a lot of incontinence issues.  He reports a cough with ACE and we will switch him to Cozaar since it is generic.  He has arthritic pain in his legs and arms but no angina.  He has chronic edema from obesity but does not want to increase his diuretic due to incontinence.  His BS has been ok and his oral hypoglycemics have been reduced.  He has chronic dyspnea that seems functional with no PND or othropnea  He had multiple issues that were non cardiac to discuss.  he had some issues with his [facility] work-up  I believe he is on disability but it should not be from his heart.  I told him he should be on long acting niaspan to prevent flushing.  He has pain in his hip and back with walking which sounds arthritic.  His prostate CA is coming back and he is on lupron with F/U [place].  he does not want a flu shot as they make him sick.    We also discussed his diet and I think the main issue is he eats out a lot and eats too much.  He understands the need for low carbs with his DM and his A1c has been 6.2.  He has no real motivation to lose weight.  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 1/2 Once Daily 2)  Doxazosin Mesylate 4 Mg Tabs (Doxazosin Mesylate) .Marland Kitchen.. 1 By Mouth Qd 3)  Norvasc  10 Mg  Tabs (Amlodipine Besylate) .Marland Kitchen.. 1 By Mouth Qd 4)  Ecotrin 325 Mg  Tbec (Aspirin) .Marland Kitchen.. 1 By Mouth Once Daily 5)  Labetalol Hcl 300 Mg  Tabs (Labetalol Hcl) .... 2 By Mouth Qd 6)  Losartan Potassium 50 Mg Tabs (Losartan Potassium) .... One Tablet By Mouth Once Daily 7)  Niaspan 1000 Mg  Tbcr (Niacin (Antihyperlipidemic)) .... 2 By Mouth Qd 8)  Multivitamins   Tabs (Multiple Vitamin) .Marland Kitchen.. 1 Tab By Mouth Once Daily 9)  Lupron Depot 30 Mg Kit (Leuprolide Acetate (4 Month)) .Marland Kitchen.. 1 Injection Every 4 Months, Depends On Range of Psa Level 10)  Pravastatin Sodium 40 Mg Tabs (Pravastatin Sodium) .... Take One Tablet By Mouth Daily At Bedtime 11)  Furosemide 20 Mg Tabs (Furosemide) .Marland Kitchen.. 1 Tab By Mouth Two Times A Day 12)  Onetouch Test  Strp (Glucose Blood) .... Check Bloodsugar Daily, Dx: 250.00 13)  Onetouch Lancets  Misc (Lancets) .... Check Bloodsugar Daily, Dx: 250.00  Allergies (verified): 1)  ! Sulfa  Past History:  Past Medical History: Last updated: 08/26/2009 Current Problems:  PVD (ICD-443.9) MYOCARDIAL INFARCTION, INFERIOR WALL (ICD-410.40) cath 2000 chronically occluded RCA silent IMI HYPERLIPIDEMIA NEC/NOS (ICD-272.4) HYPERTENSION, ESSENTIAL NOS (ICD-401.9) ARTHRALGIA (ICD-719.40) EDEMA (ICD-782.3) PROSTATE CANCER, HX OF (ICD-V10.46) SNORING, HX OF (ICD-V15.89) PAROXYSMAL NOCTURNAL  DYSPNEA (ICD-786.09) DIABETES MELLITUS, CONTROLLED (ICD-250.00) FAMILY HX OF OTHER ENDOCRINE & METABOLIC DX (ICD-V18.19) 2003 PNA; 2007 cellulitis Prostate cancer, hx of, Dr Retta Diones  Past Surgical History: Last updated: 04/27/2009 Prostatectomy 1994, S/P radiation 1995 Penile prosthesis; switch (sphincter) removed  04/2008 & replaced 02/2009 Cholecystectomy Tonsillectomy Cervical disc surgery Cath : CAD, Dr Eden Emms  Family History: Last updated: 08/24/2009 Father: seizures Mother: d of PNA Siblings: bro  gout, kidney failure  Social History: Last updated: 08/24/2009 Alcohol  use-no Regular exercise-no Former Smoker: quit 1987  Review of Systems       Denies fever, malais, weight loss, blurry vision, decreased visual acuity, cough, sputum, SOB, hemoptysis, pleuritic pain, palpitaitons, heartburn, abdominal pain, melena, lower extremity edema, claudication, or rash.   Vital Signs:  Patient profile:   65 year old male Height:      67 inches Weight:      261 pounds BMI:     41.03 Pulse rate:   84 / minute Resp:     14 per minute BP sitting:   120 / 82  (left arm)  Vitals Entered By: Kem Parkinson (August 26, 2010 9:54 AM)  Physical Exam  General:  Affect appropriate Healthy:  appears stated age HEENT: normal Neck supple with no adenopathy JVP normal no bruits no thyromegaly Lungs clear with no wheezing and good diaphragmatic motion Heart:  S1/S2 no murmur,rub, gallop or click PMI normal Abdomen: benighn, BS positve, no tenderness, no AAA no bruit.  No HSM or HJR Distal pulses intact with no bruits No edema Neuro non-focal Skin warm and dry Eczema on sholders   Impression & Recommendations:  Problem # 1:  MYOCARDIAL INFARCTION, INFERIOR WALL (ICD-410.40) No angina.  History of collateralized RCA. Continue medical Rx.  Consider future caths from radial artery His updated medication list for this problem includes:    Norvasc 10 Mg Tabs (Amlodipine besylate) .Marland Kitchen... 1 by mouth qd    Ecotrin 325 Mg Tbec (Aspirin) .Marland Kitchen... 1 by mouth once daily    Labetalol Hcl 300 Mg Tabs (Labetalol hcl) .Marland Kitchen... 2 by mouth qd  Problem # 2:  HYPERLIPIDEMIA NEC/NOS (ICD-272.4) Well controlled labs per [facility] and [facility] His updated medication list for this problem includes:    Niaspan 1000 Mg Tbcr (Niacin (antihyperlipidemic)) .Marland Kitchen... 2 by mouth qd    Pravastatin Sodium 40 Mg Tabs (Pravastatin sodium) .Marland Kitchen... Take one tablet by mouth daily at bedtime  CHOL: 141 (09/30/2009)   LDL: 75 (09/30/2009)   HDL: 38.90 (09/30/2009)   TG: 135.0 (09/30/2009)  Problem # 3:   HYPERTENSION, ESSENTIAL NOS (ICD-401.9) Well controlled His updated medication list for this problem includes:    Doxazosin Mesylate 4 Mg Tabs (Doxazosin mesylate) .Marland Kitchen... 1 by mouth qd    Norvasc 10 Mg Tabs (Amlodipine besylate) .Marland Kitchen... 1 by mouth qd    Ecotrin 325 Mg Tbec (Aspirin) .Marland Kitchen... 1 by mouth once daily    Labetalol Hcl 300 Mg Tabs (Labetalol hcl) .Marland Kitchen... 2 by mouth qd    Losartan Potassium 50 Mg Tabs (Losartan potassium) ..... One tablet by mouth once daily    Furosemide 20 Mg Tabs (Furosemide) .Marland Kitchen... 1 tab by mouth two times a day  Orders: EKG w/ Interpretation (93000)  Problem # 4:  PROSTATE CANCER, HX OF (ICD-V10.46) F/U with [facility] has had bone scan.  Continue lupron I explained to him that it is not chemo PSA per urology  Problem # 5:  DIABETES MELLITUS, CONTROLLED (ICD-250.00) Secondary to obesity.  Discussed weight loss and  diet.  Sees podiatrist and eye doctor regularly His updated medication list for this problem includes:    Glimepiride 2 Mg Tabs (Glimepiride) .Marland Kitchen... 1/2 once daily    Ecotrin 325 Mg Tbec (Aspirin) .Marland Kitchen... 1 by mouth once daily    Losartan Potassium 50 Mg Tabs (Losartan potassium) ..... One tablet by mouth once daily  Patient Instructions: 1)  Your physician recommends that you schedule a follow-up appointment in: YEAR WITH DR Eden Emms 2)  Your physician recommends that you continue on your current medications as directed. Please refer to the Current Medication list given to you today.   EKG Report  Procedure date:  08/26/2010  Findings:      NSR 84 Low voltage from size Normal ECG

## 2010-12-16 NOTE — Progress Notes (Signed)
Summary: c/o pain in lower back / hips  Phone Note Call from Patient Call back at Work Phone [phone]   Caller: Patient Reason for Call: Talk to Nurse Summary of Call: pt on new meds . c/o stop med pravastatin due to pain lower back/ hips.  Initial call taken by: Lorne Skeens,  October 25, 2010 11:48 AM  Follow-up for Phone Call        spoke with pt, after a couple weeks on the pravastatin he has had severe pain in his back and hips. he stopped  the pravastatin and the pain went away. he tried restarting it and the pain returned. pt given the ok to stop the pravastatin. he states he did not have problems with simvastatin we had changed it because of drug interaction. will foward to dr Eden Emms to decide about statin therapy Deliah Goody, RN  October 25, 2010 1:04 PM Follow-up by: Deliah Goody, RN,  October 25, 2010 12:09 PM  Additional Follow-up for Phone Call Additional follow up Details #1::        Try generic lipitor 10 mg Additional Follow-up by: Colon Branch, MD, [facility],  October 26, 2010 8:23 AM    Additional Follow-up for Phone Call Additional follow up Details #2::    pt aware, he will call with problems Deliah Goody, RN  October 26, 2010 3:09 PM   New/Updated Medications: LIPITOR 10 MG TABS (ATORVASTATIN CALCIUM) Take one tablet by mouth daily. Prescriptions: LIPITOR 10 MG TABS (ATORVASTATIN CALCIUM) Take one tablet by mouth daily.  #90 x 3   Entered by:   Deliah Goody, RN   Authorized by:   Colon Branch, MD, [facility]   Signed by:   Deliah Goody, RN on 10/26/2010   Method used:   Electronically to        [facility] # [id number]* (retail)       [address] [place], [place]  [address]       Ph: [phone]       Fax: [phone]   RxID:   [id number]

## 2010-12-30 NOTE — Letter (Signed)
Summary: [facility]  [facility]   Imported By: Kassie Mends 12/23/2010 10:06:20  _____________________________________________________________________  External Attachment:    Type:   Image     Comment:   External Document

## 2011-02-22 ENCOUNTER — Telehealth: Payer: Self-pay | Admitting: Cardiovascular Disease

## 2011-02-22 MED ORDER — LABETALOL HCL 300 MG PO TABS: ORAL_TABLET | ORAL | Status: DC

## 2011-02-22 MED ORDER — DOXAZOSIN MESYLATE 4 MG PO TABS: 4.0000 mg | ORAL_TABLET | Freq: Every day | ORAL | Status: DC

## 2011-02-22 NOTE — Telephone Encounter (Signed)
meds refilled 

## 2011-02-23 LAB — CBC
MCHC: 34.4 g/dL (ref 30.0–36.0)
MCV: 94.9 fL (ref 78.0–100.0)
Platelets: 211 10*3/uL (ref 150–400)
RBC: 4.17 MIL/uL — ABNORMAL LOW (ref 4.22–5.81)

## 2011-02-23 LAB — BASIC METABOLIC PANEL
GFR calc non Af Amer: >60 mL/min (ref >60)
Glucose, Bld: 122 mg/dL — ABNORMAL HIGH (ref 70–99)
Potassium: 4 mEq/L (ref 3.5–5.1)
Sodium: 145 mEq/L (ref 135–145)

## 2011-02-23 LAB — GLUCOSE, CAPILLARY
Glucose-Capillary: 106 mg/dL — ABNORMAL HIGH (ref 70–99)
Glucose-Capillary: 72 mg/dL (ref 70–99)

## 2011-02-23 LAB — PROTIME-INR
INR: 1 (ref 0.00–1.49)
Prothrombin Time: 13.7 seconds (ref 11.6–15.2)

## 2011-03-29 NOTE — Assessment & Plan Note (Signed)
[facility]                            CARDIOLOGY OFFICE NOTE   NAHOM, CARFAGNO                      MRN:          [id number]  DATE:02/29/2008                            DOB:          [birth year]    Hawken is seen today at the request of Dr. Alwyn Ren.  He is a new patient  to me.  He has previously been seen by Dr. Samule Ohm and Dr. Chales Abrahams.  He has  vascular disease.   The patient has coronary artery disease with a metabolic syndrome.  He  had silent inferior wall MI some time before 1998.  He has a chronic  occlusion of the right coronary artery with possible moderate circumflex  disease.  His last catheterization was by Dr. Chales Abrahams back in 2000.  He  has good LV function.   He has not had a Myoview since 2006.  At that time he had inferolateral  wall infarct with mild to moderate peri-infarct ischemia.  Apparently,  Dr. Samule Ohm felt this could be treated medically.   In talking to the patient, he also has a history of carotid bruits with  no TIA or CVA.  He has claudication which is significant.  He was placed  on Pletal a few years ago with a lot of improvement but he still has  difficulty ambulating long distances.   This has not been evaluated in quite some time either.   The patient has been compliant with his medications.   In regards to his heart, he has not had significant chest pain, PND,  orthopnea.  He has mild lower extremity edema.  He has not had syncope  or history of arrhythmias.   He has recently been diagnosed as a diabetic.  He has hypertension and  hypercholesterolemia.  He is a nonsmoker.   His review of systems is remarkable for significant ongoing prostate  problems.  He had a prostatectomy by Dr. Retta Diones and continues to have  some incontinence and has been having a switch placed twice to help  him with this.  However, he continues to leak.   Review of systems otherwise negative.   His past medical history is  remarkable for:  1. Previous cholecystectomy.  2. Previous prostatectomy.  3. Diabetes.  4. Arthritis, particularly of the right hand.  5. Hyperlipidemia.  6. Hypertension.  7. Coronary artery disease with silent MI.   The patient is allergic to SULFA.  His medication include:  1. Amaryl 2 a day.  2. Vesicare 5 a day.  3. Meloxicam 15 p.r.n.  4. An aspirin a day.  5. Labetalol 300 b.i.d.  6. Lipitor 40 a day.  7. Doxazosin 2 mg day.  8. Niaspan dose unknown.  9. Multivitamins.  10.Diovan/hydrochlorothiazide 160/25.  11.Norvasc 10.  12.His Pletal is 100 mg b.i.d.   His family history is noncontributory.   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   He is hopefully going to retire soon.  He enjoys riding his motorcycle.  He is otherwise sedentary due to his arthritis.  He does not smoke or  drink.  EXAMINATION:  Remarkable for an overweight white male.  He does have  some scent of urine from his incontinence.  Affect is appropriate.  Weight is 249, blood pressure is 130/70, pulse is 85 and regular,  afebrile, respiratory rate 16.  HEENT:  Unremarkable.  He has a right carotid bruit.  No  lymphadenopathy, thyromegaly, or JVP elevation.  LUNGS:  Clear with good diaphragmatic motion.  No wheezing.  S1, S2 with normal heart sounds.  PMI normal.  ABDOMEN:  Protuberant.  Bowel sounds positive.  No AAA, no tenderness,  no hepatosplenomegaly or hepatojugular reflux.  PTs are +2 bilaterally.  I do not feel his femoral pulses.  He has trace  edema.  NEURO:  Nonfocal.  SKIN:  Warm and dry.  No muscular weakness.   EKG shows sinus rhythm with occasional PVCs and an old Q wave in lead  III.   IMPRESSION:  1. The patient's chart was reviewed.  It took me a while to understand      all of his problems since they are somewhat complex.  In regards to      his coronary disease, he has had 60% circumflex disease and a total      right with collaterals.  He needs a  follow-up Myoview.  We will do      this with adenosine.  Continue aspirin and labetalol.  2. Carotid bruits.  He has not had high-grade disease in the past.      His last duplex was November 2008.  He had 60-79% disease on the      right ICA with a peak velocity of 2.2 meters per second and a      diastolic of 0.54 meters per second.  We will follow up his duplex,      continue aspirin.  3. Peripheral vascular disease with continued problems.  A lot of his      ambulation difficulties seem to be revolving around arthritis but      we will check ABIs.  4. Diabetes.  Follow up with Dr. Alwyn Ren, continue Amaryl.  Losing 10-      15 pounds would greatly help his insulin resistance.  5. Prostatism with prostatectomy and urinary incontinence.  Follow up      with Dr. Retta Diones.  Will have to be careful with Vesicare as it      can prolong his QT interval.  His EKG today showed a QT corrected      at 459.  6. Arthritis.  Follow up with Dr. Charlett Blake in orthopedics.  Continue      p.r.n. meloxicam.   I will see him back in about 6 months so long as his studies are low  risk.     Noralyn Pick. Eden Emms, MD, [facility]  Electronically Signed    PCN/MedQ  DD: 02/29/2008  DT: 02/29/2008  Job #: [id number]

## 2011-03-29 NOTE — Op Note (Signed)
NAMEKUTLER, VANVRANKEN               ACCOUNT NO.:  [id number]   MEDICAL RECORD NO.:  [id number]          PATIENT TYPE:  OIB   LOCATION:  1406                         FACILITY:  [facility]   PHYSICIAN:  Bertram Millard. Dahlstedt, M.D.DATE OF BIRTH:  [birth year]   DATE OF PROCEDURE:  05/19/2008  DATE OF DISCHARGE:                               OPERATIVE REPORT   PREOPERATIVE DIAGNOSIS:  Eroded artificial urinary sphincter.   POSTOPERATIVE DIAGNOSES:  1. Eroded artificial urinary sphincter.  2. Bladder neck contracture.   PROCEDURES:  1. Explantation of artificial urinary sphincter.  2. Flexible cystourethroscopy.  3. Placement of a 16-French council-tip catheter over a wire.   ATTENDING PHYSICIAN:  Bertram Millard. Dahlstedt, M.D.   RESIDENT PHYSICIAN:  Dr. Maudie Flakes.   ANESTHESIA:  General.   INDICATIONS FOR PROCEDURE:  Mr. Theodore [name] is a 65 year old white male with  past medical history positive for high-grade prostate cancer, status  post radical retropubic prostatectomy approximately 15 years ago with  adjuvant radiation therapy.  Subsequently he developed urinary  incontinence as well as erectile dysfunction.  He has had a revision of  his artificial urinary sphincter as well as placement of an inflatable  penile prosthesis in the past.  Recently he has been seen by Dr.  Retta Diones and his complained of deep perineal discomfort.  He has also  continued to have some slight incontinence despite his sphincter  functioning appropriately.  Office cystoscopy demonstrated an erosion of  his sphincter and subsequently he was recommended to have this removed.  His penile prosthesis continues to function appropriately per the  patient; however, he is not used it but only a few times since it was  implanted.  Preoperatively informed consent was obtained after  discussion of risks, consequences and benefits and complications were  discussed with the patient.   PROCEDURE IN DETAIL:  The patient was  brought to the operating room,  placed in a supine position.  He was correctly identified by his  wristband and an appropriate time-out was taken.  IV antibiotics were  administered.  His lower abdomen and perineal region were clipped.  He  then received a 10-minute scrub.  We then was prepped and draped in  normal sterile fashion after being placed in the dorsal lithotomy  position.  We began our procedure by making a low midline incision  through his previous scar.  We carried this down with Bovie  electrocautery until we identified the tubing for his artificial urinary  sphincter.  The tubing for his penile prosthesis going to its reservoir  was right at the midline and the tubing going to his sphincter was to  the left of the midline.  We used Bovie electrocautery and blunt  dissection to dissect free a significant amount of tubing that went from  the reservoir to the cuff.  Once we had dissected this as far as we  could go, we turned our attention to the patient's perineal region.  There were made a vertical incision in the midline of his perineum  through his prior scar and again used Bovie electrocautery and blunt  dissection to get down to the level of his cuff.  A pseudocapsule around  the cuff was entered.  We also dissected free a fair segment of the  tubing.  We then placed 2 Kellys on the tubing and cut it.  We then  unlocked the cuff.  We at this point attempted to place a urethral  catheter and ran into an obstruction in his proximal urethra, proximal  to the level of the artificial urinary sphincter.  We then used a  flexible cystoscope and advanced it through his urethra, identifying the  level of the erosion but also noting that he had a bladder neck  contracture.  Once in the bladder, a sensor-tip guidewire was advanced  through the cystoscope.  The cystoscope was backed off it and a 59-  [place] council-tip catheter was advanced into the bladder over the wire  using  Seldinger technique.  The bulb was inflated with 10 mL of sterile  water and we continued forward with our procedure.  Once the cuff was  unlocked, it was removed from around the patient's urethra without any  difficulty.  We then turned our attention back to the suprapubic  incision.  We were able to continue to dissect free his tubing going to  his reservoir and also to his pump.  Ultimately were able to free both  of these structures and their associated tubing without any significant  difficulty.  The tubing in the reservoir for the penile prosthesis was  not mobilized or damaged during the dissection.  There was no fluid  collection around any part of either prosthesis, no purulence or  evidence of infection was appreciated either, and subsequently we  elected to leave his inflatable penile prosthesis in place.  There was a  minimal amount of bleeding.  All bleeding sites were addressed with  Bovie electrocautery and then we turned our attention to closure.  We  closed his perineum in 2 layers reapproximating the deep layers with  Vicryl and the skin was closed with one subcuticular layer using 4-0  Monocryl, and it was dressed with Dermabond.  The abdominal incision was  closed in multiple layers.  The fascial edges cranial to the pubic bone  were reapproximated with a running suture using 2-0 PDS.  The subcu  layers were reapproximated with multiple simple interrupted sutures  using 2-0 Vicryl and the skin was closed with staples.  His Foley  catheter was placed to straight drain and this marked the end of our  procedure.  He tolerated the procedure well.  There were no  complications.  Anesthesia was reversed.  He was extubated, taken to the  recovery room in stable condition.  Dr. Retta Diones was present and  participated in all aspects of the case.     ______________________________  Maudie Flakes, MD      Bertram Millard. Dahlstedt, M.D.  Electronically Signed    DW/MEDQ  D:   05/19/2008  T:  05/19/2008  Job:  [id number]

## 2011-03-29 NOTE — Assessment & Plan Note (Signed)
[facility]                            CARDIOLOGY OFFICE NOTE   WELDEN, HAUSMANN                      MRN:          [id number]  DATE:09/04/2008                            DOB:          [birth year]    Mr. Theodore [name] is a pleasant patient referred by Dr. Alfredo Martinez for  preop clearance.  I last saw the patient on February 29, 2008.  He was a  previous patient of Dr. Samule Ohm and Dr. Chales Abrahams.  He has known coronary  artery disease with metabolic syndrome.  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   He is not having any significant chest pain.  He does have a history of  moderate circumflex disease by cath in 1998.   Looking back through the records, it appears that he had a followup cath  in 2000 which showed 60% circ disease and a distally occluded right with  collaterals.   He had a adenosine Myoview study performed on April 17, 2008.  The study  showed an EF of 52% with a small inferolateral infarct at the base with  no ischemia.  I told the patient since he is not having chest pain and  had a low-risk Myoview, he would be cleared for surgery.  The patient  has prostate cancer and he has had multiple prostate surgeries.  He  currently does not have a sphincter.  He is unemployed because he works  at [employer] and cannot do his job having to wear diapers  and changing them every hour.   Clearly, the surgery is needed.  Apparently, there is a new smaller  sphincter that may be able to be fitted.  He has a lot of scar tissue  from his previous prostatectomy surgeries and could not have this  sphincter placed previously given the potential for high-quality  improvement in his life and the fact that he is asymptomatic with a low-  risk Myoview.  I told Emilliano that I would clear him for surgery.  His  blood pressures have been running a bit low lately in our office.  He is  only  running a 100-110 systolic.  Since he has coronary artery disease,  I would like to keep him on his labetalol; however, he is on Pletal.  This apparently was started by Dr. Chales Abrahams many years ago.  He has had  pain on ambulation in his legs.  However, we just did ABIs on him, and  he has normal ABIs without evidence of vascular disease.  I told him to  stop his Pletal for the time being.  We will reassess him in 6 months  and see about stopping his Norvasc.   REVIEW OF SYSTEMS:  Remarkable for chronic hip and leg pain,  incontinence, changing his diaper every hour, recent addition of Lupron  for suppression of testosterone, type 2 diabetes, and poorly controlled  diet.   PAST MEDICAL HISTORY:  Otherwise, remarkable for coronary artery  disease; multiple previous prostatectomies; a question of peripheral  vascular disease, not supported by noninvasive testing; previous  cholecystectomy; type 2 diabetes; arthritis, particularly in the right  hand; hyperlipidemia; and hypertension.   FAMILY HISTORY:  Noncontributory.   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  He  had climbed telephone poles and required a lot of activity that he can  no longer do.  He is married.  He has older children and grandchildren.  He does not smoke or drink.   MEDICATIONS:  1. Amaryl 2 mg a day.  2. Lupron.  3. Aspirin.  4. Labetalol 300 b.i.d.  5. Pletal to be stopped.  6. Lipitor 40 a day.  7. Doxazosin 4 mg a day.  8. Niaspan 2 g a day.  9. Multivitamin.  10.Diovan HCT 160/25.  11.Norvasc 10.   ALLERGIES:  Allergic to SULFA.   PHYSICAL EXAMINATION:  VITAL SIGNS:  Blood pressure 110/70; pulse 80 and  regular; and respiratory rate 14, afebrile.  HEENT:  Unremarkable.  NECK:  Carotids are normal without bruit.  No lymphadenopathy,  thyromegaly, or JVP elevation.  LUNGS:  Clear, good diaphragmatic motion.  No wheezing.  HEART:  S1 and S2 with distant heart sounds.  PMI not  palpable.  ABDOMEN:  Benign.  Status post cholecystectomy.  No AAA.  No tenderness.  No bruit.  No hepatosplenomegaly or hepatojugular reflux.  EXTREMITIES:  Distal pulses are intact.  No edema.  Peripheral pulses  are +3 bilaterally.  NEURO:  Nonfocal.  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   IMPRESSION:  1. Preoperative clearance.  Distant history of coronary artery      disease.  No chest pain.  Continue aspirin and beta-blocker.      Myoview done on April 17, 2008, with nonischemic cleared for surgery.  2. Hypercholesterolemia.  Continue current dose of Niaspan and      Lipitor.  Lipid and liver profile in 6 months.  3. Previous hypertension, now somewhat low.  Stop Pletal.  Reassess      need for Norvasc in 6 months.  4. Peripheral vascular disease, history of likely more due to      arthritis, chronic hip problems or spinal stenosis.  Ankle-brachial      indexes were normal.  Stop Pletal.  5. Diabetes.  Continue Amaryl.  Hemoglobin A1c quarterly.  6. Prostate problems.  Followup with Dr. Retta Diones and Dr. Lorin Picket      McDiarmid to have sphincter surgery.       Noralyn Pick. Eden Emms, MD, [facility]  Electronically Signed    PCN/MedQ  DD: 09/04/2008  DT: 09/04/2008  Job #: [id number]

## 2011-03-29 NOTE — Op Note (Signed)
NAME:  Theodore [name], Theodore [name]               ACCOUNT NO.:  [id number]   MEDICAL RECORD NO.:  [id number]          PATIENT TYPE:  OIB   LOCATION:  0098                         FACILITY:  [facility]   PHYSICIAN:  Theodore Sinner, MD DATE OF BIRTH:  1945-12-11   DATE OF PROCEDURE:  03/02/2009  DATE OF DISCHARGE:                               OPERATIVE REPORT   PREOPERATIVE DIAGNOSIS:  Stress urinary continence.   POSTOPERATIVE DIAGNOSIS:  Stress urinary continence.   SURGERY:  Implantation of artificial urinary sphincter plus cystoscopy.   Mr. Theodore [name] has a complicated past history.  He has an inflatable  penile prosthesis.  He has 2 artificial cuffs with an erosion.  I  questioned whether or not I could place a cuff proximally.  We had  discussed a distal placement with intracorporeal placement with removal  of penile prosthesis.   SURGEON:  Theodore [name], M.D.   ASSISTANT:  Theodore [name], M.D.   PROCEDURE:  The patient was prepped and draped in the usual fashion.  Extra care was taken with leg positioning to minimize risk compartment  syndrome neuropathy and DVT.  I used a 17-French scope.  I scoped  through an 69 or 19-French proximal penile urethral stricture and you  could see the area of erosion just proximal to this.  I was much more  impressed that there was enough proximal urethra that was healthy  between it and the ridge of bladder neck to place an artificial  sphincter.  It was very obvious to me that it was safe to do so.  He had  a 17-French or 18-French rigid bladder neck.  The 14-French Foley  catheter was easily placed, but we did not scope through the bladder  neck based upon the complexity of his problem.   A long perineal incision was made and I dissected down to the bulbous  spongiosis muscle which was reasonably easily identified but was scarred  and thin.  I dissected distally to find virginal urethra and therefore  allowed me to go through the soft  tissue and muscle more readily.  I  sharply dissected the bulbar urethra at the bifurcation of corporal  bodies which could be felt with the penile prosthesis in place.  I could  easily identify the area of the previous erosion with scarring over it  dorsally and laterally. Instead of dorsally, we put it ventrally and  laterally.  I used my box technique to mobilize the bulbar urethra and  pull it off the patient.  I dissected around a nice sail of tissue and  made an appropriate sized window not entering the corporal bodies or  urethra.  I was very happy with the corporal placement and the fact that  the cuff was away from the erosion.  A 4-cm cuff was easily placed.   A right lower quadrant incision was made.  We dissected down to the  fascia easily.  The fascia was split along the length of its fibers.  A  large Theodore [name] was used to identify the transversalis fascia.  A balloon  was placed with  25 mL of saline.  The fascia was closed with running 0-  Vicryl on a CT-1 needle.   Because the penile prosthesis pump was on the right side, we tunneled a  Theodore [name] from the right lower quadrant incision after finger dissecting to  the left scrotum and delivered out through the perineal incision.  I  grabbed the tubing of the cuff that was prepared and brought it up  through the right lower quadrant incision.  We then finger dissected a  left hemiscrotal pocket and sewed the pump nicely in the dependent left  hemiscrotum to make sure that it was not going to fall into the  perineum.  This went very nicely.   A curved cerebellar was used to separate again the soft tissues in the  right lower quadrant and all connections were made in the usual fashion.  The device was cycled 3 or 4 times and left with a moderate dimple.   A three-layer closure was used for the soft tissue in the perineum  followed by 4-0 Vicryl for the skin.  A two-layer closure was used the  right lower quadrant followed by  Dermabond.  Fluff dressing with  pressure was placed.  The catheter was draining well at the end of the  case.  Leg position was good.   Hopefully this operation will reach Mr. Theodore [name] treatment goal.           ______________________________  Theodore Sinner, MD  Electronically Signed     SAM/MEDQ  D:  03/02/2009  T:  03/02/2009  Job:  [id number]

## 2011-03-29 NOTE — Op Note (Signed)
NAME:  Theodore [name], Theodore [name]               ACCOUNT NO.:  [id number]   MEDICAL RECORD NO.:  [id number]          PATIENT TYPE:  AMB   LOCATION:  NESC                         [facility]ACILITY:  [facility]   PHYSICIAN:  Martina Sinner, MD DATE OF BIRTH:  [birth year]   DATE OF PROCEDURE:  08/12/2008  DATE OF DISCHARGE:                               OPERATIVE REPORT   PREOPERATIVE DIAGNOSES:  1. Mild urethral stricture.  2. Stress urinary incontinence.   POSTOPERATIVE DIAGNOSES:  1. Mild urethral stricture.  2. Stress urinary incontinence.  3. Bladder neck contracture.   SURGERY:  Cystoscopy.   Mr. Brumett has a complicated past history.  He has had a radical  prostatectomy.  He is known to have a fibrotic bladder neck.  He has had  an erosion with a window in the proximal mid bulbar urethra and I  examined him under anesthesia  to see whether or not I could place the  cuff more proximally.  He has an inflatable penile prosthesis.   The patient was prepped and draped in the usual fashion.  He was placed  in the lithotomy position.  I had his legs up a little bit higher than  usual so I could get a good look at the perineum.   The patient was scoped with a 17-French scope.  Once again, you could  feel concentric narrowing of approximately a 17-French that I could just  scope through at the level of the thin urothelium at 2 o'clock extending  for approximately 1.5 to 2 cm.  I then cystoscoped to a very rigid  bladder neck.  I spent a lot of time applying perineal pressure and  seeing whether or not a cuff could be placed more proximally.  One could  argue that he may have had enough length at 6 o'clock, but dorsally he  only had approximately 1.5 cm from the fibrotic sphincter to the start  of his previous erosion.  I did not feel the cuff could be safely placed  here without overlapping the erosion.  Distally, I think he will not get  good coaptation and, based upon perineal pressure distally, it  would be  intrascrotal.  He would have to have his prosthesis removed as well.   I used a flexible scope to look at his bladder neck.  His bladder neck  was approximately a 17-French in size or a little bit larger.  There is  a little bit of dystrophic calcification at 12 o'clock.  I did not laser  this area.  There was little to no bleeding.  The bladder was emptied.  The patient was taken to the recovery room.   Unfortunately, it does not appear that Mr. Rueb can have another  sphincter.  In my opinion, a male sling will not help his severe urinary  incontinence.  I will speak to him postoperatively and go over treatment  options next week in clinic.           ______________________________  Martina Sinner, MD  Electronically Signed     SAM/MEDQ  D:  08/12/2008  T:  08/12/2008  Job:  [id number]

## 2011-04-01 NOTE — H&P (Signed)
NAME:  Theodore [name], Theodore [name] NO.:  [id number]   MEDICAL RECORD NO.:  [id number]          PATIENT TYPE:  EMS   LOCATION:  ED                           [facility]ACILITY:  [facility]   PHYSICIAN:  Corwin Levins, M.D. LHCDATE OF BIRTH:  12/30/1945   DATE OF ADMISSION:  12/25/2005  DATE OF DISCHARGE:                                HISTORY & PHYSICAL   CHIEF COMPLAINT:  Severe right flank and back pain for the last 5 days,  worsened over this time. Just back from a long car trip from [place] for his  mother-in-law's funeral.   HISTORY OF PRESENT ILLNESS:  Mr. Ursin is a 65 year old white male for whom  I was called to the emergency room for a right lower extremity cellulitis,  given his history of fevers and chills and exam consistent with right lower  extremity swelling and redness, and slight elevation of white blood cell  count. When I arrived however his chief complaint has more to do with severe  right flank and mid upper back discomfort which is quite severe. Associated  with decreased p.o. intake and shortness of breath, clearly worse to  position and pleuritic, even lifting the arms. Overall pain is severe,  debilitating. He initially has some right lower extremity pain below the  knee for about 2 days, 5 days ago. That has actually gotten remarkably  better and now only itches but still has some swelling and redness  associated.   PAST MEDICAL HISTORY:  1.  History of coronary artery disease.  2.  Hypertension.  3.  Hypercholesterolemia.  4.  Peripheral vascular disease.  5.  History of prostate cancer status post prostatectomy in 1994.  6.  Status post radiation therapy in 1995 with subsequent incontinence,      status post subsequent artificial sphincter placement.  7.  Erectile dysfunction status post penile prosthesis, July, 2006.  8.  History of pneumonia, July, 2003.   ALLERGIES:  Questionable SULFA.   SURGICAL HISTORY:  Cholecystectomy. Status post T-spine disk  surgery. Status  post T&A.   CURRENT MEDICATIONS:  1.  Ecotrin 325 mg p.o. daily.  2.  Pletal 100 mg b.i.d.  3.  Diovan & hydrochlorothiazide 150/25 one p.o. daily.  4.  Lipitor 40 mg p.o. daily.  5.  Labetalol 300 mg p.o. b.i.d.  6.  Norvasc 10 mg p.o. daily.  7.  Cardura (unclear dose) one daily.  8.  Multivitamin daily.   SOCIAL HISTORY:  Married. No tobacco, no alcohol. Works for [employer].   FAMILY HISTORY:  Noncontributory.   REVIEW OF SYSTEMS:  Noncontributory except he did take some Imodium 5 days  ago at the onset of his other symptoms due to some slight loose stool he has  had off and on previously in the past, thought to be related to radiation  therapy. He has had no BM since.   PHYSICAL EXAMINATION:  VITAL SIGNS:  Mr. Krass is a 65 year old white male  with temperature 100.8, blood pressure 117/61, heart rate is 117,  respirations 20, O2 saturation 91% on room air.  HEENT EXAM:  Sclerae clear. Tympanic membranes clear. Pharynx benign.  NECK:  No lymphadenopathy, JVD, hepatomegaly.  CHEST:  No rubs, wheezing.  CARDIAC EXAM:  Regular rate and rhythm.  ABDOMEN:  Soft with at least moderate distention, somewhat decreased bowel  sounds  with pain mild diffusely tender. This is minimal compared to the  moderate severe right flank tenderness and spasm with crying out and spasm-  like pain with lifting the right arm or even sitting up in bed.  EXTREMITIES:  Showed right lower extremity 2+ swelling with erythema below  the knee. Appears to be only mildly tender at best. Left lower extremity  shows trace edema only.   A chest x-ray has bibasilar atelectasis. Urinalysis with 3-6 white cells,  otherwise negative. No evidence for infection. Electrolytes with sodium 132,  BUN 34, creatinine 1.2, glucose 138. Liver function tests within normal  limits. White blood cell count 13,700, hemoglobin 13.6.   ASSESSMENT AND PLAN:  45.  A 65 year old white male with several problems  ongoing, somewhat      difficult history to gather but instead of simple cellulitis, I suspect      deep venous thrombosis or possible pulmonary embolus with pleuritic pain      of the right base. Possible pulmonary infarction, especially given the      mild tenderness only of the right lower extremity and the recent long      car trip. Of course one cannot rule out simple right lower extremity      cellulitis and musculoskeletal pain, however, given the car trip as      well. The presentation is complicated by obvious abdominal distention      and probable constipation. Cannot rule out obstruction as urinalysis is      negative. Also cannot rule out a lesser likely problem such as T-spine      disk disease or abscess.   PLAN:  Admission to give oxygen as needed, Dilaudid p.r.n., check blood  cultures x2. Intravenous antibiotics in the form of Kefzol. Will check chest  CT, rule out PE and abdominal/pelvic CT to rule out obstruction. He will be  given empiric Lovenox SQ tonight to be discontinued if CT negative.  1.  History of coronary artery disease - check a 12-lead EKG, apply      telemetry. Check routine cardiac enzymes      with his shortness of breath but suspect this is less likely than      pulmonary embolus or cellulitis as above.  2.  Hypertension. Otherwise continue medications.  3.  Other medical problems. Otherwise continue home medications.           ______________________________  Corwin Levins, M.D. [facility]     JWJ/MEDQ  D:  12/25/2005  T:  12/25/2005  Job:  [id number]   cc:   Titus Dubin. Alwyn Ren, M.D. [facility]  [phone] [place]  [place] [address]   Salvadore Farber, M.D. [facility]  [address]  [address]  [place]  [place] [address]   Bertram Millard. Dahlstedt, M.D.  Fax: [phone]

## 2011-04-01 NOTE — Assessment & Plan Note (Signed)
[facility]                             PULMONARY OFFICE NOTE   Theodore [name], Theodore [name]                      MRN:          [id number]  DATE:03/07/2007                            DOB:          Jan 31, 1946    PROBLEM:  This is a 65 year old man referred through the courtesy of Dr.  Samule Ohm with a concern of suspected sleep apnea.   HISTORY:  He has been waking at 5:15am and starting work at 8am, getting  off at 5pm, and then over the last two years has noted daytime  sleepiness such that around 7pm he will fall asleep in his chair and he  will wake up at 11pm, go to bed at 11:30pm, and sleep the rest of the  night. Short sleep latency. He snores in his recliner chair, but thinks  that he snores less in bed laying on his sides. He is aware of nasal  congestion blamed on indoor heat in the winter and air conditioning in  the summer, but a humidifier helps some. He is not aware of any abrupt  waking or choking during the night, but he does notice nasal stuffiness.  Nocturia 2 or 3 times per night does affect his sleep. His wife tells  him that she would leave if his snoring were obtrusive, so he assumes  that it is not. He has not been told of apnea.   MEDICATIONS:  1. Ecotrin 325 mg.  2. Labetalol 300 mg x2.  3. Cilostazol 100 mg x2.  4. Lipitor 40 mg.  5. Doxazosin 4 mg.  6. Diovan HCT 160/25 1/2 tablet.  7. Niaspan 2000 mg nightly.  8. Norvasc 10 mg x1/2.  9. Amaryl 1 mg.  10.Hyoscyamine 0.375 mg x2 p.r.n.  11.Multivitamin.  Drug intolerant of SULFA.   REVIEW OF SYSTEMS:  Daytime sleepiness if inactive, non-restorative  sleep. Incidental URI in the past week with sore throat, head  congestion. He says that he caught it from his wife who works in day  care. Weight has gone up several pounds in the last year.   PAST HISTORY:  Hypertension, diabetes, elevated cholesterol, prostate  cancer with radiation, radical prostatectomy and incontinence  valve,  silent myocardial infarction in the past with chronic occlusion of the  right coronary artery, asymptomatic cerebral vascular disease, spine  surgery.   SOCIAL HISTORY:  Quit smoking in 1987. He smoked for about 20 years  before that. He is married with children. 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   FAMILY HISTORY:  Nobody with sleep problems. Father died of MI. Mother  died with pneumonia. Brother died with endstage renal disease.   OBJECTIVE:  VITAL SIGNS:  Weight 245 pounds, blood pressure 118/68,  pulse regular 90, room air saturation 91%.  GENERAL:  This is an overweight gentleman who is alert.  SKIN:  No rash.  ADENOPATHY:  None noted.  HEENT:  Dentures, palette spacing 3-4/4, pharynx is clear, there is no  strider, voice quality is normal, no neck vein distension, no thyroid  enlargement.  CHEST:  Clear and quiet breath sounds.  HEART:  Sounds regular without murmur.  NEUROLOGIC:  Unremarkable to observation.   IMPRESSION:  1. Probable obstructive sleep apnea.  2. Coronary disease.  3. Obesity.   PLAN:  We are scheduling a split protocol nocturnal polysomnogram. I  have talked with him about the physiology and medical concerns of sleep  apnea. He will return after his sleep study for follow up. I appreciate  the chance to see him.     [place] D. Maple Hudson, MD, Tonny Bollman, FACP  Electronically Signed    CDY/MedQ  DD: 03/07/2007  DT: 03/08/2007  Job #: [id number]   cc:   [facility]

## 2011-04-01 NOTE — H&P (Signed)
[place]. [facility]  Patient:    Theodore [name], Theodore [name] Visit Number: [id number] MRN: [id number]          Service Type: MED Location: [phone] Attending Physician:  Dolores Patty Dictated by:   Titus Dubin. Alwyn Ren, M.D. [facility] Admit Date:  05/14/2002   CC:         Madolyn Frieze. Jens Som, M.D. [facility]   History and Physical  CHIEF COMPLAINT: Theodore [name] is a 65 year old white male, with known coronary artery disease, who has been sick since Saturday, May 11, 2002.  HISTORY OF PRESENT ILLNESS: The onset was at approximately 6:10 a.m. as anorexia.  This was associated with malaise and fatigue over several hours. He vomited that evening after eating a chicken sandwich.  He has had nausea and vomiting x3 since that initial episode.  He last vomited May 12, 2002 in the evening.  He has been on ginger ale and water since.  Associated symptoms include coating of his tongue and loose watery stools.  He has had temperature up to 101 degrees.  Additionally, he has had scant nonproductive cough and dizziness.  He did have some chest tightness on Sunday, May 12, 2002.  He describes this as if "my chest was clamped."  This clamping sensation resolved within a few minutes.  He has had no chest pain since.  He denies any significant travel, exposure to sick pets or animals.  He has had no antibiotics for over a year.  His water source is a well but his wife also drinks from this water and has not been ill.  Recently, however, she has been out of town, in [place].  PAST MEDICAL HISTORY:  1. Catheterization in 2001 which revealed significant coronary artery     disease, for which he is being treated medically.  He is seen annually by     the [facility] cardiologists.  2. He has had prostate cancer, for which he had surgery and radiation.  PAST SURGICAL HISTORY: Additional surgical history includes cervical disk.  FAMILY HISTORY: Positive for myocardial infarction in  his grandfather and father.  There is no family history of cancer.  His great-aunt had diabetes.  CURRENT MEDICATIONS:  1. Ecotrin 325 mg q.d.  2. Labetalol 300 mg b.i.d.  3. Doxazosin 8 mg 1/2 q.d.  4. Norvasc 10 mg q.d.  5. Hydrochlorothiazide 25 mg 1/2 q.d.  6. Lipitor 20 mg q.d.  7. Pletal 100 mg b.i.d.  ALLERGIES: He is allergic or intolerant to SULFA.  SOCIAL HISTORY: He does not drink, and quit smoking May 17, 1986.  REVIEW OF SYSTEMS: Includes night sweats and dizziness.  The stool has been described as brown and liquid.  He is followed by Dr. Retta Diones and had a PSA of 0.07 in April 2003.  The remainder of the Review Of Systems was checked and is negative.  PHYSICAL EXAMINATION:  GENERAL: He appears acutely ill and describes himself as feeling as if he is going to pass out.  He was weak and diaphoretic, requiring help moving onto the examination table.  VITAL SIGNS: Temperature was 101.2 degrees, pulse 115-125, respiratory rate 17, blood pressure 100/56.  HEENT: Ophthalmic examination revealed arteriolar narrowing but no other findings.  There was no scleral icterus.  Otolaryngologic examination was unremarkable except for some coating of the tongue.  NECK: Thyroid was normal to palpation.  CHEST: Clear to auscultation, with no increased work of breathing.  CARDIAC: He exhibited an S4, tachycardia with slight  flow murmur.  All pulses were intact.  He had trace edema.  ABDOMEN: Protuberant but nontender.  Bowel sounds were present.  He had no organomegaly or masses, and no lymphadenopathy.  GU: Examination initially deferred due to his profoundly weak state.  NEUROLOGIC: He was weak but no localizing neuropsychiatric changes.  Mentally he was having trouble remembering and described this as "being fuzzy" due to the weakness.  SKIN: Damp but devoid of jaundice.  LABORATORY DATA: EKG revealed sinus tachycardia with a rate of 116.  There were junctional  changes but no definite ischemia.  There was some diffuse loss of T voltage.  IMPRESSION:  1. He is now admitted with gastroenteritis associated with nausea, vomiting,     and diarrhea as well as fever and chills.  He has relative hypotension     with dizziness and near syncope.  2. He has coronary artery disease, with a recent episode of chest pain.  3. He has a previous history of prostate cancer, for which he has had surgery     and radiation.  PLAN:  1. He will be admitted to the hospital with telemetry.  2. He will receive IV fluids and parenteral anti-nausea medications.  3. Cultures will be collected.  4. Fever treated with Tylenol.  5. If bacterial etiology is suggested antibiotics will be initiated.  6. His labetalol will be decreased to 300 mg 1/2 q.12h.  7. Norvasc will be decreased to 5 mg because of his relative     hypotension.  8. He will receive cardiac enzymes because of his single episode of     chest pain and his known coronary artery disease. Dictated by:   Titus Dubin. Alwyn Ren, M.D. [facility] Attending Physician:  Dolores Patty DD:  05/15/02 TD:  05/18/02 Job: [id number]

## 2011-04-01 NOTE — Op Note (Signed)
NAME:  Theodore [name], Theodore [name] NO.:  [id number]   MEDICAL RECORD NO.:  [id number]          PATIENT TYPE:  INP   LOCATION:  0006                         FACILITY:  [facility]   PHYSICIAN:  Bertram Millard. Dahlstedt, M.D.DATE OF BIRTH:  [birth year]   DATE OF PROCEDURE:  DATE OF DISCHARGE:                                 OPERATIVE REPORT   PREOPERATIVE DIAGNOSES:  1.  History of prostate cancer, status post retropubic prostatectomy.  2.  Erectile dysfunction.  3.  Stress urinary incontinence, status post external beam radiation to      pelvis, status post AMS/AUS sphincter.   SURGEON:  Bertram Millard. Retta Diones, M.D.   Threasa HeadsRema Fendt, M.D..   ANESTHESIA:  General endotracheal.   ASSESSMENT:  Previously placed AMS/AUS sphincter.   PROCEDURE:  Patient was identified by his wrist bracelet and brought to room  10, where he received preoperative antibiotics and was administered general  anesthesia.  He was then prepped and draped in the usual sterile fashion.  Next, a 41 French coude catheter was placed at the level of the urinary  bladder.  Clear urine output was obtained, and the catheter was kept.  Next,  an infrapubic incision was made.  We dissected down through Scarpa's fascia.  The previously placed AUS tubing was identified, and the pseudocapsule  surrounding it was dissected free using Bovie cautery.  At this point, we  were able to extract the reservoir.  We were unable to ascertain if this  reservoir was superficial to the internal anterior fascia.  Next, the tubing  was divided sharply.  Following this, we made an incision in the midline  through the anterior posterior fascial sheath.  The midline rectus was  identified and divided bluntly.  We then developed adequate space in the  space of Retzius for two reservoir pumps.  Next, the reservoir for the ITP  was placed in the left side of the previously created space.  It was filled  with 60 cc of sterile water.  It  was found to be without autoinflation.  It  was then shodded.  Next, we placed the reservoir for the AUS, which was  placed on the right aspect of the aforementioned space.  This was filled  with 25 cc of sterile water, and again, no autoinflation was found.  This  was shodded.  Next, with the surgeon's finger, we created a tunnel to the  right and left hemiscrotum, respectively.  The respective pumps for the  reservoirs were being placed in the right hemiscrotum and the pump for the  AUS being placed in the left hemiscrotum, which was done after the previous  pump was removed.  Following this, the Cope retractor was set, and we  visualized the suspensory ligament of the penis with the adjacent left and  right corporal bodies.  We first turned our attention to the right corporal  body.  Opposing stay sutures were placed using 2-0 Vicryl approximately 1 cm  apart, making sure to be far enough lateral to stay away to avoid the  neurovascular bundle.  Between these holding stitches,  corporotomy was made  with Bovie cautery, approximately 1 cm in length.  We gently dissected with  tissue scissors both proximally and distally, making sure to obtain  _________ within the corporal body.  Following this, using Mentor dilators,  we sequentially dilated both proximally and distally from 8 sequentially to  14.  This was done without difficulty.  This process was then repeated on  the left side with opposing stay sutures being placed with 2-0 Vicryl.  A  corporotomy was made in between.  This was done lateral away from the  neurovascular bundle at a symmetric level to the aforementioned right side.  Corporotomy was made with Bovie cautery, and again, we dissected with tissue  scissors both proximally and distally, making sure to stay within the  corporal body.  We then dilated with Mentor dilators from 8 sequentially to  14.  We then measured each side, both sides measured approximately 20 cm.  Next, a  three-piece AMS IPP was placed.  Starting on the right, the distal  string for the right corporal cylinder was unwound and placed through a  Keith needle, which was loaded into the placement device, which was then  placed through the corporotomy and down the corporal body distally.  Once  reaching the distal aspect of the tunnel, which was at the glans penis, the  device was fired, and the [place] needle exited the glans penis.  That was  pulled through the glans penis with the hemostat, and the string was pulled  through, advancing the corporal cylinder into the right corporal body.  We  insured there were no kinks in the corporal body.  Next, a 1 cm rear tip was  placed on the proximal aspect of the right cylinder, and it was placed  proximally in the right corporal body.  This process was then repeated on  the left, again, using a loaded device with a Keith needle to pass distally  in the left corporal body with the [place] needle exiting the glans penis.  The string then subsequently advanced the distal aspect of the corporal  cylinder.  We double-checked, as there is no kinking or twisting of the  cylinder.  Again, using a 1 cm rear tip extender, the proximal aspect of the  left corporal device was placed.  We then used approximately 25 cc of  sterile water to inflate the IPP.  Minimal excellent contour was noted with  proper positioning of the device and only very mild bulging at the  corporotomies.  Next, the proximal end of each stay suture was tied together  with an air knot and then pulled snug through the corporotomy with the  distal aspect of each stay suture being tied down.  This was done on each  side.  On the right, additional figure-of-eight, 2-0 Vicryl, needed to be  placed to close the corporotomy.  This was done with great care, using a  hockey stick device to keep the tip of the needle away from the inflatable device.  Next, the tubing from the pump and reservoir was  trimmed.  Using a  connector device, making sure there was no air in the line, these were  connected and secured with a trumping device.   We then turned our attention to the patient's perineum.  A 3 cm perineal  midline incision was made over the perineal raphe.  We dissected down  through the underlying dermis and fat using Bovie cautery.  We were able to  feel the previously divided tubing and dissected down on this tubing to the  level of the previously placed cuff.  The pseudocapsule was dissected off,  and the cuff was released and passed off the table.  We noted very healthy  urethra at this level.  Next, we placed a new cuff size and secured it.  The  tubing for this, after being shodded, was passed through to the space of  Retzius on a Vanderbilt pass.  Following this, the two connections were made  with the reservoir being connected to the pump, and the reservoir being  connected to the device.  This was done, making sure there was no air in the  lines.  The excess tubing was removed, and the connections were secured with  the crimping device.  Following this, we copiously irrigated the wound.  We  then removed the patient's Foley catheter and placed in a _________ fossa  navicularis and irrigated.  Noted no leakage from his urethra or through the  incision.  The patient's wounds were found to be hemostatic.  Scarpa's  fascia was closed on the infrapubic incision using running 2-0 Vicryl, being  careful not to involve the underlying tubing.  The skin was then closed with  staples, and dressing was applied.  On the perineal incision, the  subcutaneous fat was closed with a running 4-0 Vicryl, and the skin was  reopposed with a running 4-0 subcuticular Vicryl.  Dressing was applied.  Foley catheter was easily reinserted, obtaining clear urine output.  The  patient was then reversed from his anesthesia, which he tolerated well  without complication.   Dr. Marcine Matar was  present for the entire case and participated in  all aspects of this case.      EG/MEDQ  D:  05/18/2005  T:  05/18/2005  Job:  [id number]

## 2011-04-01 NOTE — Assessment & Plan Note (Signed)
[facility]                            CARDIOLOGY OFFICE NOTE   Theodore, [name]                      MRN:          [id number]  DATE:01/25/2007                            DOB:          [birth year]    HISTORY OF PRESENT ILLNESS:  Theodore [name] is a 65 year old gentleman with  coronary disease in the setting of metabolic syndrome.  He has suffered  a silent inferior myocardial infarction at some point prior to 1998.  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  He has not had any symptoms concerning for angina.  He denies any  exertional dyspnea, PND, orthopnea, edema, palpitations and syncope.  He  has not had any symptoms concerning for TIA or stroke.   His only complaint is of easy fatigue.  He says he comes home and is in  bed by 7:30 most nights.  He says his wife has noted him to snore when  he is lying flat in bed.   CURRENT MEDICATIONS:  1. Aspirin 325 mg daily.  2. Labetalol 300 mg twice daily.  3. Pletal 100 mg twice daily.  4. Lipitor 40 mg daily.  5. __________  2 mg daily.  6. Norvasc 5 mg daily.  7. Niaspan 2,000 mg daily.  8. Amaryl 1 mg daily.  9. Multivitamin.  10.Vitamin E.  11.Calcium.   PHYSICAL EXAMINATION:  He is generally well appearing, although  overweight, and in no distress.  Heart rate 86, blood pressure 133/79, weight of 249 pounds.  Weight is  up 11 pounds over the past 18 months.  He has no jugular venous distention, thyromegaly or lymphadenopathy.  LUNGS:  Are clear to auscultation. Respiratory effort is normal.  He has a nondisplaced point of maximal cardiac impulse. There is a  regular rate and rhythm without murmur, rub, or gallop.  ABDOMEN:  Is obese, soft, nondistended, nontender.  There is no  hepatosplenomegaly and no pulsatile midline mass.  There is no bruit.  Bowel sounds  are normal.  EXTREMITIES:  Are warm without clubbing, cyanosis, edema or ulceration.  Carotid pulses 2+ bilaterally without bruit.  Femoral pulses are 2+  bilaterally.   Electrocardiogram demonstrates normal sinus rhythm with Q in lead 3  alone, consistent with prior inferior infarct.   IMPRESSION AND RECOMMENDATIONS:  1. Coronary disease:  Remains asymptomatic.  Ejection fraction      preserved.  Since he has had prior myocardial infarction, continue      aspirin and beta blocker.  2. Hypercholesterolemia:  Managed by Dr. Alwyn Ren.  Goal LDL less than      70.  3. Prostate cancer, initially diagnosed in 1994.  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  4. Cerebrovascular disease:  Asymptomatic.  Check carotid duplex.  5. Easy fatigability .  Will refer to pulmonary for evaluation of      sleep apnea.  Salvadore Farber, MD  Electronically Signed    WED/MedQ  DD: 01/25/2007  DT: 01/26/2007  Job #: [id number]

## 2011-04-01 NOTE — Discharge Summary (Signed)
Theodore [name], [name] NO.:  [id number]   MEDICAL RECORD NO.:  [id number]          PATIENT TYPE:  INP   LOCATION:  1431                         FACILITY:  [facility]   PHYSICIAN:  Theodore [name], M.D. Theodore [name]:  1945-12-18   DATE OF ADMISSION:  12/25/2005  DATE OF DISCHARGE:  12/30/2005                                 DISCHARGE SUMMARY   DISCHARGE DIAGNOSES:  1.  Right lower extremity cellulitis, improved. Continue p.o. antibiotics      with outpatient follow-up.  2.  Acute low back pain, negative compression fracture, no metastatic      disease by plain film and bone scan, unable to do MRI secondary to      penile prosthesis, suspect musculoskeletal, improved with symptomatic      treatment.  3.  New diagnosis type 2 diabetes with A1c of 6.7, status post nutritional      and diabetic coordinator evaluation, continues low sugar diet and      outpatient follow-up with primary M.D.  4.  Hypoxia, suspect obstructive sleep apnea/obesity hypoventilation as      chest x-ray clear and CT chest negative for pulmonary embolus.  Will      recheck saturations at time of discharge and arrange home O2 as needed.  5.  Mild normocytic anemia, hemoglobin of 11.6 at discharge.  6.  History of high-grade prostate cancer with positive nodes, status post      surgery in 1994, XRT  in 1995, and prosthesis secondary to erectile      dysfunction in July 2006.  Outpatient follow-up with Theodore [name] as      prior to admission.  7.  History of coronary disease, status post cath in 2001, nonobstructive.  8.  History of peripheral vascular disease, continue Pletal as prior to      admission.  9.  History of hypertension.  10. Dyslipidemia.   DISCHARGE MEDICATIONS:  1.  Avelox 400 mg once daily x7 additional days.  2.  Flexeril 10 mg one q.8 h p.r.n. muscle spasm.  3.  Vicodin 5/500 one to two p.o. q. 4-6 hours p.r.n. pain.   Other medications are as prior to admission without  change.  They include:  1.  Ecotrin 325 mg p.o. daily.  2.  Pletal 100 mg b.i.d.  3.  Diovan/HCTZ 160/25 p.o. daily  4.  Lipitor 40 mg daily.  5.  Labetalol 300 mg b.i.d.  6.  Norvasc 10 mg daily.  7.  Cardura 2 mg daily.  8.  Multivitamin one daily.   DISPOSITION:  The patient is discharged home where he lives with his wife.  He is told to resume his normal activities with elevation of his right lower  extremity while sitting and at rest.  He may return to work after seen by  his primary MD next week which is scheduled with Dr. Alwyn [name], Tuesday,  February 20 at 3:15 p.m.  He is also instructed to call M.D. or return to  the emergency room if fever, worsening pain or unable to tolerate p.o. He  has also been counseled  regarding new diagnosis of diabetes and need for  lifestyle change including weight loss, low sugar, low fat diet and close  outpatient follow-up with his primary M.D.   CONDITION ON DISCHARGE:  Medically improved and stable.   HOSPITAL COURSE:  Problem 1. Right lower extremity cellulitis. The patient  is a pleasant 65 year old gentleman with multiple medical problems as listed  above who came to the emergency room with multiple diffuse complaints  including low back pain and right lower extremity fever and swelling,  feeling stiff.  On exam in the emergency room he clearly had evidence of  right lower extremity cellulitis and because of the back pain, there was  concern also for PE.  A CT angiogram was done which was negative for PE, and  it was felt that his back pain was likely musculoskeletal in origin. He was  begun on empiric full dose Lovenox which was changed to prophylaxis once no  evidence of  clot could be obtained.  He was treated with IV Ancef which was  changed to Rocephin and then to Augmentin, lastly to Avelox for broadest  coverage of this cellulitis process which has improved during this  hospitalization. He has been afebrile and was anxious for  discharge home.  He is instructed to continue his antibiotics and return to the emergency  room for fever, pain, or inability to take p.o.  Close outpatient follow-up  has  been arranged.   Problem 2. Low-back pain. The patient attributes this to muscle spasm which  occurred while sleeping in a bed in [place] where they attended mother-in-  law's funeral.  He received Flexeril and Vicodin with some improvement.  Social visit of Theodore [name], who reminded [place] of his history of prostate  cancer.  He suggested bone scan to rule out metastatic disease and none  could be identified. His pain was improved with symptomatic treatment for  presumed muscle spasm.  No evidence of UTI or stones.  He may continue  Flexeril and Vicodin as needed at home, with increased activity as  tolerated.  PT and OT have seen the patient and agree with his activity  plan.   Problem 3.  Hypoxia while sleeping. The patient had mild hypoxia while  sleeping, likely contributing from his obesity. We will recheck room air  saturations with ambulation when awake and alert prior to discharge home.  __________ concerning for PE though CT angiogram was negative for such and  negative for any other peripheral disease, outpatient follow-up with primary  care physician. The patient has had no shortness of breath, cough or  pleurisy but again is reminded to return to the emergency room for problems  in the meanwhile.      Theodore [name], M.D. [facility]  Electronically Signed     VL/MEDQ  D:  12/30/2005  T:  12/30/2005  Job:  [id number]

## 2011-04-01 NOTE — Discharge Summary (Signed)
[place]. [facility]  Patient:    [name], Theodore [name] Visit Number: [id number] MRN: [id number]          Service Type: MED Location: [phone] Attending Physician:  Dolores Patty Dictated by:   Justine Null, M.D. [facility] Admit Date:  05/14/2002 Discharge Date: 05/18/2002                             Discharge Summary  REASON FOR ADMISSION:  Fever.  Please refer to Dr. Caryl Never dictated History & Physical for details.  HOSPITAL COURSE: The patient was admitted and treated with intravenous fluids as well as Tequin and Flagyl.  His fever resolved.  He did not have cough or shortness of breath during my evaluations with him on July 4 or 5.  With regard to diarrhea, this improved significantly by July 5 after he was placed on a milk-free diet.  He had significant hypokalemia during his hospitalization which responded to supplementation.  In the context of his history of radiation proctitis, it is possible that his diarrhea is exacerbation by this, as he has a history of fecal incontinence due to the radiation proctitis.  He was discharged home in good condition on May 18, 2002.  DISCHARGE DIAGNOSES: 1. Pneumonia. 2. Gastroenteritis. 3. Hypokalemia due to diarrhea. 4. History of radiation proctitis which may exacerbate his diarrhea. 5. Other chronic medical problems as noted in the History & Physical.  DISCHARGE MEDICATIONS: 1. Ecotrin 325 mg a day. 2. Normodyne 300 mg twice a day. 3. Cardura 4 mg daily. 4. Norvasc 10 mg daily. 5. Hydrochlorothiazide 12.5 mg daily. 6. Lipitor 10 mg daily. 7. Pletal 100 mg twice a day. 8. Tequin 400 mg daily through July 10. 9. Flagyl 500 mg twice a day through July 6.  ACTIVITY:  The patient was told to take it easy for now and return to work on July 14.  DIET:  No dairy products until diarrhea is resolved.  FOLLOWUP:  With Dr. Alwyn Ren in one to two weeks. Dictated by:   Justine Null, M.D.  [facility] Attending Physician:  Dolores Patty DD:  05/18/02 TD:  05/21/02 Job: [id number]

## 2011-04-01 NOTE — H&P (Signed)
NAME:  JAHRELL, HAMOR NO.:  [id number]   MEDICAL RECORD NO.:  [id number]          PATIENT TYPE:  INP   LOCATION:  0006                         FACILITY:  [facility]   PHYSICIAN:  Bertram Millard. Dahlstedt, M.D.DATE OF BIRTH:  19-May-1946   DATE OF ADMISSION:  05/18/2005  DATE OF DISCHARGE:                                HISTORY & PHYSICAL   REASON FOR ADMISSION:  Urinary incontinence, erectile dysfunction.   BRIEF HISTORY:  Elijah Birk is a nice 65 year old gentleman who underwent a radical  prostatectomy in 1994.  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  The patient underwent postoperative  radiation.  For quite a few years he had disease-free status.  Over the past  few years, however, he has had persistent/elevating PSA.  His last PSA was  1.8- that was about three months ago.  Last June it was 1.2.   The patient, in 1995, after his radiation when he was left with  incontinence, underwent artificial urinary sphincter placement.  That helped  moderately but he has had persistent leakage and has needed to wear some  sort of pad within his undershorts to prevent wetting his pants.  He has  wanted to have a revision for some time and additionally, he has had ED.  At  the same time, he wants to proceed with placement of an inflatable penile  prosthesis.  He has actually wanted this for approximately 9 to 10 years.   Recently, the patient has decided to proceed with IPP placement.  He  presents now for revision of his artificial urinary sphincter and placement  of three piece AUS IPP.  He understands the risks and complications,  including, but not limited to, need for revision due to mechanical failure,  need for revision or removal due to infection, bleeding, injury to the  urinary tract among others.  He desires to proceed.   PAST MEDICAL HISTORY:  Significant for coronary artery disease.  He is  followed by Dr. Samule Ohm for this.   He has a history of hypertension and  hyperlipidemia.   PAST SURGICAL HISTORY:  Significant for cholecystectomy in 1998, cervical  disc surgery in 1983.  Artificial urinary sphincter placement in 1995.  Radical retropubic prostatectomy in 1994.  Tonsillectomy as a child.   MEDICATIONS:  1.  Ecotrin 325 mg p.o. daily - he stopped this over a week ago.  2.  Pletal 100 mg one p.o. b.i.d.  3.  Diovan/HCT 160/25 mg one p.o. daily.  4.  Lipitor 40 mg one p.o. daily.  5.  Labetalol 300 mg one p.o. b.i.d.  6.  Norvasc 10 mg one p.o. q.a.m.  7.  Cardura.  8.  Multivitamin.   ALLERGIES:  POSSIBLE ALLERGY TO SULFA.   SOCIAL HISTORY:  Patient is married.  He is nonsmoker.  He quit cigarette  smoking about 20 years ago. He does not drink.  He works for [employer].   FAMILY HISTORY:  Noncontributory.   REVIEW OF SYSTEMS:  Noncontributory.   PHYSICAL EXAMINATION:  GENERAL APPEARANCE:  Obese,  pleasant adult male.  VITAL SIGNS:  Blood pressure 120/70. Pulse 85. Temperature 97.8. Respiratory  rate 20.  NECK:  Supple.  No thyromegaly or adenopathy.  CHEST:  Clear to auscultation bilaterally.  Normal inspiratory and  expiratory excursions.  HEART:  Normal rate and rhythm.  ABDOMEN:  Protuberant, soft, nondistended, nontender.  No masses and no  organomegaly.  PELVIC:  Bladder nonpalpable.  Well healed lower abdominal incision in the  midline without incisional hernia.  No inguinal hernias.  Phallus is  circumcised.  No lesions.  No fibrotic areas or plaques.  Glans is normal.  Meatus is normal location and size.  No blood or discharge.  Scrotal skin is  normal.  AUS is palpable in the right hemiscrotum.  Both testicles were  normal.  Normal anal sphincter tone.  Rectal revealed an absent gland.  No  extrinsic rectal masses.  EXTREMITIES:  Normal dorsalis pedis pulse and posterior tibial pulses.   CLINICAL DATA:  Electrocardiogram revealed possible old anterior myocardial  infarction,  otherwise normal.  Chest x-ray with no active disease.  BMET  normal except for glucose of 109.  CBC was normal.  Urinalysis was clear.   IMPRESSION:  1.  Erectile dysfunction status post radical prostatectomy.  The patient      desires inflatable penile prosthetic placement.  2.  Stress urinary incontinence, recurrent, after artificial urinary      sphincter placement.  He is to have full revision.   PLAN:  Admit directly to the operating room for inflatable penile prosthesis  placement and revision/replacement of artificial urinary sphincter.       SMD/MEDQ  D:  05/18/2005  T:  05/18/2005  Job:  [id number]   cc:   Titus Dubin. Alwyn Ren, M.D. [facility]   Salvadore Farber, M.D. [facility]  [address]  [address]  [place]  [place] [address]

## 2011-05-14 ENCOUNTER — Other Ambulatory Visit: Payer: Self-pay | Admitting: Cardiovascular Disease

## 2011-05-16 ENCOUNTER — Telehealth: Payer: Self-pay | Admitting: Cardiovascular Disease

## 2011-05-16 MED ORDER — NIACIN ER (ANTIHYPERLIPIDEMIC) 1000 MG PO TBCR: EXTENDED_RELEASE_TABLET | ORAL | Status: DC

## 2011-05-16 NOTE — Telephone Encounter (Signed)
Pt calling needing another RX of  Niaspin 1000mg  2X/day. 180 per refill, 90 days supply.  Still using [facility].

## 2011-06-12 ENCOUNTER — Other Ambulatory Visit: Payer: Self-pay | Admitting: Internal Medicine

## 2011-06-13 ENCOUNTER — Telehealth: Payer: Self-pay | Admitting: Internal Medicine

## 2011-06-13 NOTE — Telephone Encounter (Signed)
Sent in

## 2011-06-13 NOTE — Telephone Encounter (Signed)
Refill glimipride - [facility]

## 2011-06-17 ENCOUNTER — Ambulatory Visit: Payer: Self-pay

## 2011-06-17 DIAGNOSIS — E119 Type 2 diabetes mellitus without complications: Secondary | ICD-10-CM

## 2011-06-17 LAB — HEMOGLOBIN A1C: Hgb A1c MFr Bld: 6.5 % (ref 4.6–6.5)

## 2011-06-22 ENCOUNTER — Telehealth: Payer: Self-pay | Admitting: Internal Medicine

## 2011-06-22 NOTE — Telephone Encounter (Signed)
Spoke to pt to see if he could get labs on Thursday before his Fri 8/10 appt and his schedule was full---have added lab info to his appt info---he has to go to [place] for his labwork

## 2011-06-22 NOTE — Telephone Encounter (Signed)
Message copied by Merrie Roof on Wed Jun 22, 2011  9:13 AM ------      Message from: Edgardo Roys      Created: Thu Jun 16, 2011  7:46 AM                   ----- Message -----         From: Pecola Lawless, MD         Sent: 06/15/2011   6:06 PM           To: Stephan Minister, CMA            No labs since 4/11 ; he needs fasting labs & OV ( lipids, BMET, A1c, urine micro albumin,TSH, hepatic ; 250.00).

## 2011-06-22 NOTE — Telephone Encounter (Signed)
noted 

## 2011-06-24 ENCOUNTER — Ambulatory Visit (INDEPENDENT_AMBULATORY_CARE_PROVIDER_SITE_OTHER): Payer: Medicare Other | Admitting: Internal Medicine

## 2011-06-24 ENCOUNTER — Encounter: Payer: Self-pay | Admitting: Internal Medicine

## 2011-06-24 DIAGNOSIS — Z9189 Other specified personal risk factors, not elsewhere classified: Secondary | ICD-10-CM

## 2011-06-24 DIAGNOSIS — E785 Hyperlipidemia, unspecified: Secondary | ICD-10-CM

## 2011-06-24 DIAGNOSIS — E119 Type 2 diabetes mellitus without complications: Secondary | ICD-10-CM

## 2011-06-24 DIAGNOSIS — I2510 Atherosclerotic heart disease of native coronary artery without angina pectoris: Secondary | ICD-10-CM | POA: Insufficient documentation

## 2011-06-24 DIAGNOSIS — I10 Essential (primary) hypertension: Secondary | ICD-10-CM

## 2011-06-24 LAB — BASIC METABOLIC PANEL
CO2: 30 mEq/L (ref 19–32)
Chloride: 104 mEq/L (ref 96–112)
Glucose, Bld: 120 mg/dL — ABNORMAL HIGH (ref 70–99)
Sodium: 142 mEq/L (ref 135–145)

## 2011-06-24 LAB — LIPID PANEL
HDL: 48.8 mg/dL (ref >39.00)
LDL Cholesterol: 79 mg/dL (ref 0–99)
Total CHOL/HDL Ratio: 3
Triglycerides: 119 mg/dL (ref 0.0–149.0)
VLDL: 23.8 mg/dL (ref 0.0–40.0)

## 2011-06-24 LAB — HEPATIC FUNCTION PANEL
Albumin: 3.9 g/dL (ref 3.5–5.2)
Total Protein: 6.6 g/dL (ref 6.0–8.3)

## 2011-06-24 LAB — TSH: TSH: 1.47 u[IU]/mL (ref 0.35–5.50)

## 2011-06-24 MED ORDER — ONETOUCH DELICA LANCETS MISC: Status: DC

## 2011-06-24 MED ORDER — GLUCOSE BLOOD VI STRP: ORAL_STRIP | Status: DC

## 2011-06-24 NOTE — Progress Notes (Signed)
  Subjective:    Patient ID: Theodore [name], male    DOB: 03-12-1946, 65 y.o.   MRN: [id number]  HPI  #1 HYPERTENSION: Disease Monitoring: Blood pressure range-not checked   Chest pain, palpitations, claudication- no       Dyspnea- no Medications: Compliance- yes  Lightheadedness,Syncope- no    Edema- persistent  # 2 DIABETES: Disease Monitoring: Blood Sugar ranges-95-102  Polyuria/phagia/dipsia- frequency  with diuretic since prostate surgery       Visual problems- no; appt  due with Dr Sherryle Lis.No PMH of retinopathy Medications: Compliance- yes  Hypoglycemic symptoms- no Podiatry care: every 3 months, Dr Stacie Acres A1c  06/17/2011 was  6.5%. It has ranged  from 6.1 -6.3 over the last 2 years  #3 HYPERLIPIDEMIA: Disease Monitoring: See symptoms for Hypertension Medications: Compliance- yes  Abd pain, bowel changes- no   Muscle aches- no Last lipids on record were 09/30/2009. HDL was 39 and LDL 75.       PMH Smoking Status noted : quit 1987       Review of Systems he has "electric shock" type pain momentarily upon standing in the lumbosacral area and hips. He will use a cart for support when shopping.     Objective:   Physical Exam Gen.: Healthy and well-nourished in appearance. Alert, appropriate and cooperative throughout exam. Weight excess Eyes: No corneal or conjunctival inflammation noted.  Neck: No deformities, masses, or tenderness noted.  Thyroid normal. Lungs: Normal respiratory effort; chest expands symmetrically. Lungs are clear to auscultation without rales, wheezes, or increased work of breathing. BS decreased Heart: Normal rate and rhythm. Normal S1 and S2. No gallop, click, or rub. No murmur. Heart sounds distant Abdomen: Bowel sounds normal; abdomen soft and nontender. No masses, organomegaly or hernias noted.Protuberant Musculoskeletal/extremities: No clubbing, cyanosis, edema, or deformity noted.  Nail health  Good. 1+ edema Vascular: Carotid, radial  artery, dorsalis pedis and  posterior tibial pulses are full and equal. Faint carotid  bruits present, R > L. Neurologic: Alert and oriented x3. Deep tendon reflexes symmetrical and 1/2+          Skin: Intact without suspicious lesions or rashes. Mild hyperemia R shin w/o cellulitis Lymph: No cervical, axillary lymphadenopathy present. Psych: Mood and affect are normal. Normally interactive                                                                                        Assessment & Plan:  #1 diabetes, well controlled  #2 hypertension, controlled  #3 dyslipidemia; lipids do  #4 edema; he is on amlodipine  #5 coronary artery disease; as per Dr. Eden Emms  #6 his back and hip pain suggest possible spinal stenosis. If symptoms persist or progress then evaluation would be indicated.  Plan: See orders and recommendations.  He'll be asked to hold the amlodipine to see if the edema improves. Blood pressure will need to be monitored off his calcium channel blocker. Renal and hepatic function will be checked because of the edema.

## 2011-06-24 NOTE — Patient Instructions (Signed)
Eat a low-fat diet with lots of fruits and vegetables, up to 7-9 servings per day. Avoid obesity; your goal is waist measurement < 40 inches.Consume less than 40 grams of sugar per day from foods & drinks with High Fructose Corn Sugar as #1,2,3 or # 4 on label. Follow the low carb nutrition program in [employer] as closely as possible to prevent Diabetes progression & complications. White carbohydrates (potatoes, rice, bread, and pasta) have a high spike of sugar and a high load of sugar. For example a  baked potato has a cup of sugar and a  french fry  2 teaspoons of sugar. Yams, wild  rice, whole grained bread &  wheat pasta have been much lower spike and load of  sugar. Portions should be the size of a deck of cards or your palm.   Stop amlodipine and monitor blood pressure. This agent may be causing the edema you're experiencing.Blood Pressure Goal  Ideally is an AVERAGE < 135/85. This AVERAGE should be calculated from @ least 5-7 BP readings taken @ different times of day on different days of week. You should not respond to isolated BP readings , but rather the AVERAGE for that week

## 2011-08-11 LAB — CBC
HCT: 42.6
Platelets: 174
RBC: 4.58
WBC: 6.5

## 2011-08-11 LAB — BASIC METABOLIC PANEL
BUN: 13
GFR calc Af Amer: >60
GFR calc non Af Amer: >60
Potassium: 4
Sodium: 144

## 2011-08-15 LAB — POCT I-STAT 4, (NA,K, GLUC, HGB,HCT)
Glucose, Bld: 118 — ABNORMAL HIGH
HCT: 44
Hemoglobin: 15

## 2011-08-31 ENCOUNTER — Observation Stay (HOSPITAL_COMMUNITY)
Admission: EM | Admit: 2011-08-31 | Discharge: 2011-09-02 | Disposition: A | Discharge status: Home / Self Care | Payer: Medicare Other | Attending: Family Medicine | Admitting: Family Medicine

## 2011-08-31 DIAGNOSIS — L03119 Cellulitis of unspecified part of limb: Secondary | ICD-10-CM | POA: Insufficient documentation

## 2011-08-31 DIAGNOSIS — E119 Type 2 diabetes mellitus without complications: Secondary | ICD-10-CM | POA: Insufficient documentation

## 2011-08-31 DIAGNOSIS — Z79899 Other long term (current) drug therapy: Secondary | ICD-10-CM | POA: Insufficient documentation

## 2011-08-31 DIAGNOSIS — Z7982 Long term (current) use of aspirin: Secondary | ICD-10-CM | POA: Insufficient documentation

## 2011-08-31 DIAGNOSIS — E785 Hyperlipidemia, unspecified: Secondary | ICD-10-CM | POA: Insufficient documentation

## 2011-08-31 DIAGNOSIS — Z87891 Personal history of nicotine dependence: Secondary | ICD-10-CM | POA: Insufficient documentation

## 2011-08-31 DIAGNOSIS — I739 Peripheral vascular disease, unspecified: Secondary | ICD-10-CM | POA: Insufficient documentation

## 2011-08-31 DIAGNOSIS — D649 Anemia, unspecified: Secondary | ICD-10-CM | POA: Insufficient documentation

## 2011-08-31 DIAGNOSIS — Z9089 Acquired absence of other organs: Secondary | ICD-10-CM | POA: Insufficient documentation

## 2011-08-31 DIAGNOSIS — M7989 Other specified soft tissue disorders: Secondary | ICD-10-CM | POA: Insufficient documentation

## 2011-08-31 DIAGNOSIS — I10 Essential (primary) hypertension: Secondary | ICD-10-CM | POA: Insufficient documentation

## 2011-08-31 DIAGNOSIS — I2510 Atherosclerotic heart disease of native coronary artery without angina pectoris: Secondary | ICD-10-CM | POA: Insufficient documentation

## 2011-08-31 DIAGNOSIS — L02419 Cutaneous abscess of limb, unspecified: Principal | ICD-10-CM | POA: Insufficient documentation

## 2011-08-31 DIAGNOSIS — M79609 Pain in unspecified limb: Secondary | ICD-10-CM

## 2011-08-31 DIAGNOSIS — C61 Malignant neoplasm of prostate: Secondary | ICD-10-CM | POA: Insufficient documentation

## 2011-08-31 LAB — CBC
MCH: 31.5 pg (ref 26.0–34.0)
MCHC: 35.3 g/dL (ref 30.0–36.0)
Platelets: 179 10*3/uL (ref 150–400)
RBC: 4.09 MIL/uL — ABNORMAL LOW (ref 4.22–5.81)

## 2011-08-31 LAB — DIFFERENTIAL
Basophils Relative: 1 % (ref 0–1)
Eosinophils Absolute: 0.1 10*3/uL (ref 0.0–0.7)
Monocytes Absolute: 0.6 10*3/uL (ref 0.1–1.0)
Monocytes Relative: 10 % (ref 3–12)
Neutrophils Relative %: 58 % (ref 43–77)

## 2011-08-31 LAB — BASIC METABOLIC PANEL
Calcium: 8.9 mg/dL (ref 8.4–10.5)
GFR calc non Af Amer: 89 mL/min — ABNORMAL LOW (ref >90)
Sodium: 137 mEq/L (ref 135–145)

## 2011-08-31 LAB — LACTIC ACID, PLASMA: Lactic Acid, Venous: 1.3 mmol/L (ref 0.5–2.2)

## 2011-09-01 LAB — CBC
HCT: 36.6 % — ABNORMAL LOW (ref 39.0–52.0)
MCH: 31 pg (ref 26.0–34.0)
MCV: 90.8 fL (ref 78.0–100.0)
RBC: 4.03 MIL/uL — ABNORMAL LOW (ref 4.22–5.81)
RDW: 13.2 % (ref 11.5–15.5)
WBC: 4.2 10*3/uL (ref 4.0–10.5)

## 2011-09-01 LAB — COMPREHENSIVE METABOLIC PANEL
ALT: 24 U/L (ref 0–53)
AST: 24 U/L (ref 0–37)
Albumin: 2.8 g/dL — ABNORMAL LOW (ref 3.5–5.2)
Alkaline Phosphatase: 74 U/L (ref 39–117)
BUN: 14 mg/dL (ref 6–23)
CO2: 29 mEq/L (ref 19–32)
Calcium: 8.9 mg/dL (ref 8.4–10.5)
Chloride: 99 mEq/L (ref 96–112)
Creatinine, Ser: 0.67 mg/dL (ref 0.50–1.35)
GFR calc Af Amer: >90 mL/min (ref >90)
GFR calc non Af Amer: >90 mL/min (ref >90)
Glucose, Bld: 111 mg/dL — ABNORMAL HIGH (ref 70–99)
Potassium: 3.5 mEq/L (ref 3.5–5.1)
Sodium: 137 mEq/L (ref 135–145)
Total Bilirubin: 0.4 mg/dL (ref 0.3–1.2)
Total Protein: 6.1 g/dL (ref 6.0–8.3)

## 2011-09-01 LAB — GLUCOSE, CAPILLARY
Glucose-Capillary: 109 mg/dL — ABNORMAL HIGH (ref 70–99)
Glucose-Capillary: 115 mg/dL — ABNORMAL HIGH (ref 70–99)
Glucose-Capillary: 146 mg/dL — ABNORMAL HIGH (ref 70–99)

## 2011-09-01 LAB — FERRITIN: Ferritin: 310 ng/mL (ref 22–322)

## 2011-09-01 LAB — VITAMIN B12: Vitamin B-12: 798 pg/mL (ref 211–911)

## 2011-09-01 LAB — DIFFERENTIAL
Eosinophils Relative: 3 % (ref 0–5)
Lymphocytes Relative: 39 % (ref 12–46)
Lymphs Abs: 1.6 10*3/uL (ref 0.7–4.0)
Monocytes Relative: 11 % (ref 3–12)

## 2011-09-01 LAB — IRON AND TIBC
Iron: 71 ug/dL (ref 42–135)
Saturation Ratios: 32 % (ref 20–55)
TIBC: 221 ug/dL (ref 215–435)
UIBC: 150 ug/dL (ref 125–400)

## 2011-09-01 LAB — MAGNESIUM: Magnesium: 1.6 mg/dL (ref 1.5–2.5)

## 2011-09-01 LAB — HEMOGLOBIN A1C
Hgb A1c MFr Bld: 6.9 % — ABNORMAL HIGH (ref <5.7)
Mean Plasma Glucose: 151 mg/dL — ABNORMAL HIGH (ref <117)

## 2011-09-01 LAB — PHOSPHORUS: Phosphorus: 3.4 mg/dL (ref 2.3–4.6)

## 2011-09-01 LAB — FOLATE: Folate: >20 ng/mL

## 2011-09-02 LAB — GLUCOSE, CAPILLARY: Glucose-Capillary: 158 mg/dL — ABNORMAL HIGH (ref 70–99)

## 2011-09-03 NOTE — H&P (Signed)
NAME:  Theodore [name], Theodore [name] NO.:  [id number]  MEDICAL RECORD NO.:  [id number]  LOCATION:  WLED                         FACILITY:  [facility]  PHYSICIAN:  Kathlen Mody, MD       DATE OF BIRTH:  1946/01/11  DATE OF ADMISSION:  08/31/2011 DATE OF DISCHARGE:                             HISTORY & PHYSICAL   PRIMARY CARE PHYSICIAN:  Dr. Clearance Coots.  CHIEF COMPLAINT:  Right lower extremity swelling and redness since Saturday.  HISTORY OF PRESENT ILLNESS:  A 65 year old gentleman with history of coronary artery disease, hypertension, type 2 diabetes, prostate cancer; came into the ER with right lower extremity swelling, redness, and pain; worsened since Saturday.  As per the patient, it all started 3 weeks ago with itching and pain, and with worsening edema of the right lower extremity.  It has improved with anti-itch lotion, but since Saturday, it did get worse.  Denies any fever.  Has occasional chills at home. Denies any injury to the right lower extremity.  Denies any chest pain, shortness of breath, cough, nausea, vomiting, diarrhea, abdominal pain, or any urinary complaints.  Denies any headache, blurry vision, tingling or numbness, or any focal weakness.  Hospitalist service has requested to admit the patient for further evaluation and management of cellulitis.  PAST MEDICAL HISTORY: 1. Coronary artery disease with recent CAB in 2008. 2. Hypertension and type 2 diabetes, not on any medications at this     time.  The patient was taken off Amaryl a month ago by his primary     care physician, Dr. Clearance Coots. 3. Hyperlipidemia. 4. Prostate cancer, on Lupron. 5. Peripheral vascular disease.  PAST SURGICAL HISTORY: 1. Multiple prostatectomies. 2. Cholecystectomy.  ALLERGIES:  To SULFA MEDICATIONS.  FAMILY HISTORY:  No significant.  SOCIAL HISTORY:  The patient lives at home with his wife.  Quit smoking and alcohol more than 20 years ago.  Denies any recreational drug  use.  PERTINENT PHYSICAL EXAMINATION:  VITAL SIGNS:  He is afebrile, temperature of 98.1; pulse of 90 per minute; respiratory rate 20; blood pressure 120/57; saturating 95% on room air. GENERAL:  On exam, he is alert, afebrile, oriented x3, comfortable, in no acute distress. HEENT:  Normocephalic, atraumatic.  No scleral icterus.  No JVD.  Pupils equal and reacting to light. CARDIOVASCULAR:  S1, S2 heard.  No rubs, murmurs, or gallops. RESPIRATORY EXAM:  Chest clear to auscultation bilaterally.  No wheezing or rhonchi.  ABDOMEN:  Soft, nontender, nondistended.  Bowel sounds are heard. EXTREMITIES:  Pulses felt.  Right lower extremity edema more when compared to the left lower extremity.  Right lower extremity from the ankle up to the knee; erythema, swelling, and tenderness present and warmth. NEUROLOGICAL EXAM:  Oriented x3, able to move all extremities.  No focal deficits.  HOME MEDICATIONS: 1. Lupron IM injection. 2. Centrum vitamin tab. 3. Furosemide 20 mg twice daily. 4. Losartan 50 mg 1 tab daily. 5. Aspirin 81 mg daily. 6. Doxazosin 4 mg 1 tab daily. 7. Atorvastatin 10 mg 1 tab daily. 8. Labetalol 300 mg twice daily. 9. Niaspan 0.5 mg 2 tabs daily.  PERTINENT LABS:  CBC significant for hemoglobin of 12.9,  hematocrit of 36.5.  Basic metabolic panel significant for a glucose of 135.  Lactic acid level of 1.3.  DIAGNOSTIC STUDIES:  The patient had a venous duplex of the right lower extremity, preliminary report negative for acute DVT.  ASSESSMENT/PLAN:  A 65 year old gentleman with a history of coronary artery disease, hypertension, type 2 diabetes,  hyperlipidemia; admitted for right lower extremity cellulitis.  We will start the patient on IV vancomycin and IV Levaquin for 24 to 48 hours, and if the cellulitis is improving, can we change to oral pain medications for disposition. Elevate the right lower extremity.  Venous duplex of the left right lower extremity,  negative for acute deep venous thrombosis.  Coronary artery disease.  Continue the patient's aspirin, atorvastatin, losartan, and Lasix.  Hypertension controlled, continue with home antihypertensives.  Type 2 diabetes, currently not on any antidiabetic medications.  We will get a hemoglobin A1c and put the patient on sliding scale insulin.  Normocytic anemia, most likely anemia of chronic disease, but we will get an anemia profile.  Deep venous thrombosis prophylaxis.  Subcutaneous Lovenox dosing as per the pharmacy.  The patient is full code.          ______________________________ Kathlen Mody, MD     VA/MEDQ  D:  08/31/2011  T:  08/31/2011  Job:  [id number]  Electronically Signed by Kathlen Mody MD on 09/03/2011 08:13:24 PM

## 2011-09-09 NOTE — Discharge Summary (Signed)
NAMEMarland Kitchen  Theodore [name], Theodore [name] NO.:  [id number]  MEDICAL RECORD NO.:  [id number]  LOCATION:  1345                         FACILITY:  [facility]  PHYSICIAN:  Brendia Sacks, MD    DATE OF BIRTH:  03-25-1946  DATE OF ADMISSION:  08/31/2011 DATE OF DISCHARGE:  09/02/2011                              DISCHARGE SUMMARY   PRIMARY CARE PHYSICIAN:  Dr. Clearance Coots.  CONDITION ON DISCHARGE:  Improved.  DISPOSITION:  Home.  DISCHARGE DIAGNOSES: 1. Right lower extremity cellulitis. 2. Hypertension, stable. 3. Diabetes mellitus type 2, diet controlled, stable.  HISTORY OF PRESENT ILLNESS:  This is a 65 year old man presented with a 3-week history of right lower extremity edema and pain, and was admitted for right lower extremity cellulitis.  HOSPITAL COURSE:  Mr. Dewan was admitted to the medical floor and placed on broad-spectrum antibiotic therapy.  This was quickly narrowed to Levaquin, and he continues to improve dramatically.  He has minimal pain this morning and swelling continues to improve.  He notes that he did have one area of pimple-like area popped up earlier sometime yesterday evening and another one popped up this morning.  On examination, this is an extremely small lesion of approximately 2 mm in diameter.  It is nontender and appears almost as a pimple.  There is no appreciable fluid within it, and there is certainly no evidence of abscess on examination.  The other lesion, he is talking about, appears to be healing skin.  The significance of these lesions is unclear.  He appears to be improving on Levaquin; nevertheless, because of this pimple-like lesion, I will go ahead and place him on clindamycin as well, although I do not think his history or his examination is consistent with community- acquired MRSA.  He appears to have met maximal inpatient hospital benefit, and appears to be stable for discharge.  CONSULTATIONS:  None.  PROCEDURES:  None.  IMAGING:   None.  MICROBIOLOGY:  None.  PERTINENT LABORATORY STUDIES: 1. Hemoglobin A1c was 6.9. 2. CBC essentially unremarkable. 3. Capillary blood sugar is well controlled. 4. Basic metabolic panel unremarkable. 5. Hepatic function panel, unremarkable. 6. Anemia panel was unremarkable.  PHYSICAL EXAMINATION ON DISCHARGE:  GENERAL:  The patient is feeling well, no complaints. VITAL SIGNS:  Temperature is 97.9, pulse 97, respirations 16, blood pressure 149/66, saturation 98% on room air. CARDIOVASCULAR:  Regular rate and rhythm.  No murmur, rub, or gallop. RESPIRATORY:  Clear to auscultation bilaterally.  No wheezes, rales, or rhonchi.  Normal respiratory effort. EXTREMITIES:  Right lower extremity shows continued improvement in his erythema.  He continues to recede from the outline margins, and swelling continues to decrease.  As noted above, he does have a small 2 mm in diameter pimple-like lesion that is nontender and has no appreciable fluid inside.  There is no evidence of abscess.  DISCHARGE INSTRUCTIONS:  The patient will be discharged home today.  DIET:  A diabetic diet, heart healthy.  FOLLOWUP:  Follow up with Dr. Clearance Coots in 2 weeks.  DISCHARGE MEDICATIONS:  New: 1. Acetaminophen 325 mg 2 tablets every 4 hours as needed for pain. 2. Clindamycin 300 mg p.o. t.i.d., to complete a 7-day  course. 3. Levofloxacin 750 mg p.o. daily, to complete a 7-day course.  Resume the following home medications; 1. Aspirin 81 mg p.o. daily. 2. Lipitor 10 mg p.o. daily. 3. Centrum Silver p.o. daily. 4. Doxazosin 4 mg p.o. daily. 5. Lasix 20 mg p.o. b.i.d. 6. Labetalol 300 mg p.o. b.i.d. 7. Losartan 50 mg p.o. daily. 8. Lupron 1 injection intramuscularly per his doctor's orders. 9. Niaspan 1000 mg 2 tablets p.o. daily.  TIME COORDINATING DISCHARGE:  20 minutes.     Brendia Sacks, MD     DG/MEDQ  D:  09/02/2011  T:  09/02/2011  Job:  [id number]  cc:   Dr. Clearance Coots  Electronically  Signed by Brendia Sacks  on 09/09/2011 02:40:36 PM

## 2011-09-15 ENCOUNTER — Ambulatory Visit: Payer: Self-pay | Admitting: Cardiovascular Disease

## 2011-09-15 ENCOUNTER — Encounter: Payer: Self-pay | Admitting: Internal Medicine

## 2011-09-16 ENCOUNTER — Encounter: Payer: Self-pay | Admitting: Internal Medicine

## 2011-09-16 ENCOUNTER — Ambulatory Visit (INDEPENDENT_AMBULATORY_CARE_PROVIDER_SITE_OTHER): Payer: Medicare Other | Admitting: Internal Medicine

## 2011-09-16 DIAGNOSIS — Z136 Encounter for screening for cardiovascular disorders: Secondary | ICD-10-CM

## 2011-09-16 DIAGNOSIS — E119 Type 2 diabetes mellitus without complications: Secondary | ICD-10-CM

## 2011-09-16 DIAGNOSIS — M545 Low back pain, unspecified: Secondary | ICD-10-CM

## 2011-09-16 DIAGNOSIS — E785 Hyperlipidemia, unspecified: Secondary | ICD-10-CM

## 2011-09-16 DIAGNOSIS — I10 Essential (primary) hypertension: Secondary | ICD-10-CM

## 2011-09-16 DIAGNOSIS — Z8546 Personal history of malignant neoplasm of prostate: Secondary | ICD-10-CM

## 2011-09-16 DIAGNOSIS — R42 Dizziness and giddiness: Secondary | ICD-10-CM

## 2011-09-16 DIAGNOSIS — I491 Atrial premature depolarization: Secondary | ICD-10-CM

## 2011-09-16 MED ORDER — GABAPENTIN 100 MG PO CAPS: 100.0000 mg | ORAL_CAPSULE | ORAL | Status: DC

## 2011-09-16 NOTE — Progress Notes (Signed)
  Subjective:    Patient ID: Theodore [name], male    DOB: 01-Jan-1946, 65 y.o.   MRN: [id number]  HPI he was hospitalized 10/18-10/20 with cellulitis in the right lower extremity. He was discharged on Levaquin & clindamycin which have been completed. He denies any fever or chills; he has daytime  sweats which he attributes to his Niaspan.He has been off Lupron X 6 months.  He denies any diarrhea with the antibiotic course.  In the hospital sugars ranged from 115 to a high of 170. Since he's been home his fasting blood sugars have been as high as 173. Two hours after meal the highest has been less than 190.  He is seeing his podiatrist within the last 3 months for nail care. He does have tingling in his feet.    Review of Systems  He reports being somewhat groggy & lightheaded   since discharge.  He's had chronic low back pain. For several months he has "electric shock" across the lumbosacral area after sitting for a while he tries to stand up.     Objective:   Physical Exam  He is in no acute distress.  He has no lymphadenopathy in the head, neck or axilla.  Chest is clear without increased work of breathing  He has an  irregular rhythm (note: He sees Dr Eden Emms ; he states that he is monitoring his carotids)  Thyroid is normal to palpation. Deep tendon reflexes are 1/2-1+. Light touch is normal the feet  All pulses are intact and no bruits are noted. There is no evidence of cellulitis the right lower extremity. Skin is dry and scaly.  Nail health appears good        Assessment & Plan:  #1 cellulitis right lower extremity. Clinically this has resolved. There is residual skin drying which should be addressed  #2 irregular rhythm; rule out atrial fibrillation  #3 diabetes, present status must be clarified  #4 lightheadedness since discharge ; present blood pressure is actually low  #5 lumbosacral pain suggestive of a radiculopathy variant, possibly due to spinal  stenosis  Plan see orders and recommendations.  EKG reveals occasional PAC. There is no atrial fibrillation present. He has an Q wave  in III and aVF; computer interprets this as an old damage pattern.

## 2011-09-16 NOTE — Patient Instructions (Signed)
Assess response of the low back symptoms to the medication. If symptoms persist or progress referral to a nonoperative back specialist is  recommended.

## 2011-09-23 ENCOUNTER — Other Ambulatory Visit: Payer: Self-pay | Admitting: Internal Medicine

## 2011-09-26 ENCOUNTER — Other Ambulatory Visit (INDEPENDENT_AMBULATORY_CARE_PROVIDER_SITE_OTHER): Payer: Medicare Other

## 2011-09-26 ENCOUNTER — Telehealth: Payer: Self-pay | Admitting: Cardiovascular Disease

## 2011-09-26 DIAGNOSIS — E119 Type 2 diabetes mellitus without complications: Secondary | ICD-10-CM

## 2011-09-26 DIAGNOSIS — I10 Essential (primary) hypertension: Secondary | ICD-10-CM

## 2011-09-26 MED ORDER — FUROSEMIDE 20 MG PO TABS: 20.0000 mg | ORAL_TABLET | Freq: Two times a day (BID) | ORAL | Status: DC

## 2011-09-26 NOTE — Telephone Encounter (Signed)
Called pt and told him med was refilled

## 2011-09-26 NOTE — Progress Notes (Signed)
12  

## 2011-09-26 NOTE — Telephone Encounter (Signed)
Pt has been out of town and went to refill Furosimide, out of refills, only has 1 pill left, [facility], needs 90 day supply, with 1  Year refills, pls call when called in [phone] needs asap

## 2011-09-27 ENCOUNTER — Ambulatory Visit: Payer: Self-pay | Admitting: Cardiovascular Disease

## 2011-09-27 LAB — CBC WITH DIFFERENTIAL/PLATELET
Basophils Absolute: 0 10*3/uL (ref 0.0–0.1)
Eosinophils Relative: 9 % — ABNORMAL HIGH (ref 0.0–5.0)
Lymphs Abs: 1.9 10*3/uL (ref 0.7–4.0)
MCV: 93.9 fl (ref 78.0–100.0)
Monocytes Absolute: 0.4 10*3/uL (ref 0.1–1.0)
Monocytes Relative: 8.3 % (ref 3.0–12.0)
Neutrophils Relative %: 39 % — ABNORMAL LOW (ref 43.0–77.0)
Platelets: 141 10*3/uL — ABNORMAL LOW (ref 150.0–400.0)
RDW: 13.9 % (ref 11.5–14.6)
WBC: 4.5 10*3/uL (ref 4.5–10.5)

## 2011-09-27 LAB — TSH: TSH: 1.46 u[IU]/mL (ref 0.35–5.50)

## 2011-09-27 LAB — BASIC METABOLIC PANEL
BUN: 15 mg/dL (ref 6–23)
GFR: 85.02 mL/min (ref >60.00)
Potassium: 4.6 mEq/L (ref 3.5–5.1)
Sodium: 146 mEq/L — ABNORMAL HIGH (ref 135–145)

## 2011-09-27 LAB — HEMOGLOBIN A1C: Hgb A1c MFr Bld: 7.5 % — ABNORMAL HIGH (ref 4.6–6.5)

## 2011-09-29 ENCOUNTER — Encounter: Payer: Self-pay | Admitting: Cardiovascular Disease

## 2011-09-29 ENCOUNTER — Ambulatory Visit (INDEPENDENT_AMBULATORY_CARE_PROVIDER_SITE_OTHER): Payer: Medicare Other | Admitting: Cardiovascular Disease

## 2011-09-29 DIAGNOSIS — I10 Essential (primary) hypertension: Secondary | ICD-10-CM

## 2011-09-29 DIAGNOSIS — E119 Type 2 diabetes mellitus without complications: Secondary | ICD-10-CM

## 2011-09-29 DIAGNOSIS — I2119 ST elevation (STEMI) myocardial infarction involving other coronary artery of inferior wall: Secondary | ICD-10-CM

## 2011-09-29 DIAGNOSIS — E785 Hyperlipidemia, unspecified: Secondary | ICD-10-CM

## 2011-09-29 NOTE — Patient Instructions (Addendum)
Your physician wants you to follow-up in: YEAR WITH DR NISHAN  You will receive a reminder letter in the mail two months in advance. If you don't receive a letter, please call our office to schedule the follow-up appointment.  Your physician recommends that you continue on your current medications as directed. Please refer to the Current Medication list given to you today. 

## 2011-09-29 NOTE — Progress Notes (Signed)
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 He is overweight and has had multiple sphincter procedures and has a lot of incontinence issues. He reports a cough with ACE and we will switch him to Cozaar since it is generic. He has arthritic pain in his legs and arms but no angina. He has chronic edema from obesity but does not want to increase his diuretic due to incontinence. His BS has been ok and his oral hypoglycemics have been reduced. He has chronic dyspnea that seems functional with no PND or othropnea  He had multiple issues that were non cardiac to discuss. he had some issues with his [facility] work-up I believe he is on disability but it should not be from his heart. I told him he should be on long acting niaspan to prevent flushing. He has pain in his hip and back with walking which sounds arthritic. His prostate CA is coming back and he is on lupron with F/U [place]. he does not want a flu shot as they make him sick.   Reviewed labs from 3days ago and A1c 7.5  Needs to be back on oral hypoglhycemic.   Recent hospitalizaiton for cellulitis.  [place] with no DVT  F/U [place] for PSA   ROS: Denies fever, malais, weight loss, blurry vision, decreased visual acuity, cough, sputum, SOB, hemoptysis, pleuritic pain, palpitaitons, heartburn, abdominal pain, melena, lower extremity edema, claudication, or rash.  All other systems reviewed and negative  General: Affect appropriate Healthy:  appears stated age HEENT: normal Neck supple with no adenopathy JVP normal no bruits no thyromegaly Lungs clear with no wheezing and good diaphragmatic motion Heart:  S1/S2 no murmur,rub, gallop or click PMI normal Abdomen: benighn, BS positve, no tenderness, no AAA no bruit.  No HSM or HJR Distal pulses intact with no bruits No edema Neuro non-focal Skin warm and dry No muscular weakness   Current Outpatient Prescriptions  Medication Sig Dispense Refill  . aspirin 325 MG  tablet Take 325 mg by mouth daily.        Marland Kitchen atorvastatin (LIPITOR) 10 MG tablet Take 10 mg by mouth daily.        Marland Kitchen doxazosin (CARDURA) 4 MG tablet Take 1 tablet (4 mg total) by mouth at bedtime.  90 tablet  3  . furosemide (LASIX) 20 MG tablet Take 1 tablet (20 mg total) by mouth 2 (two) times daily.  180 tablet  3  . glucose blood (ONE TOUCH ULTRA TEST) test strip Check blood sugar once daily  100 each  3  . labetalol (NORMODYNE) 300 MG tablet 2 tabs daily  180 tablet  3  . Leuprolide Acetate (LUPRON IJ) Inject as directed. 1 injection every 4 months       . losartan (COZAAR) 50 MG tablet Take 50 mg by mouth daily.        . Multiple Vitamins-Minerals (CENTRUM SILVER PO) Take by mouth daily.        . niacin (NIASPAN) 1000 MG CR tablet 2 TABS PO DAILY  180 tablet  3  . ONETOUCH DELICA LANCETS MISC Check blood sugar once daily  100 each  3    Allergies  Sulfonamide derivatives  Electrocardiogram: 08/26/10  SR 84 low voltage otherwise normal  Assessment and Plan

## 2011-09-29 NOTE — Assessment & Plan Note (Signed)
Stable with no angina and good activity level.  Continue medical Rx  

## 2011-09-29 NOTE — Assessment & Plan Note (Signed)
Needs to be back on oral hypoglycemic Will forward to Dr Alwyn Ren

## 2011-09-29 NOTE — Assessment & Plan Note (Signed)
Cholesterol is at goal.  Continue current dose of statin and diet Rx.  No myalgias or side effects.  F/U  LFT's in 6 months. Lab Results  Component Value Date   LDLCALC 79 06/24/2011

## 2011-09-29 NOTE — Assessment & Plan Note (Signed)
Well controlled.  Continue current medications and low sodium Dash type diet.    

## 2011-10-24 ENCOUNTER — Other Ambulatory Visit: Payer: Self-pay | Admitting: Cardiovascular Disease

## 2011-10-24 MED ORDER — ATORVASTATIN CALCIUM 10 MG PO TABS: 10.0000 mg | ORAL_TABLET | Freq: Every day | ORAL | Status: DC

## 2011-10-24 NOTE — Telephone Encounter (Signed)
New Msg: Pt calling stating that he needs new RX for atorvastatin 10 mg- 90 day supply for one years time called into [facility] [address]. Please return pt call to inform when new RX has been called in. Pt took last of RX this AM.

## 2011-12-26 ENCOUNTER — Other Ambulatory Visit: Payer: Self-pay | Admitting: Cardiovascular Disease

## 2011-12-26 MED ORDER — LOSARTAN POTASSIUM 50 MG PO TABS: 50.0000 mg | ORAL_TABLET | Freq: Every day | ORAL | Status: DC

## 2011-12-26 NOTE — Telephone Encounter (Signed)
New Refill   Patient would like his yearly refill on this medication with 90 day supplies.  Verified pharmacy is correct.  Last visit 09/2011.  Patient request return call at hm#  regarding this refill, he is down to his last couple of pills.   losartan (COZAAR) 50 MG tablet

## 2012-02-20 ENCOUNTER — Other Ambulatory Visit: Payer: Self-pay | Admitting: Cardiovascular Disease

## 2012-02-20 MED ORDER — LABETALOL HCL 300 MG PO TABS: ORAL_TABLET | ORAL | Status: DC

## 2012-02-20 NOTE — Telephone Encounter (Signed)
Pt is out of pills and call pt when ready

## 2012-02-21 ENCOUNTER — Telehealth: Payer: Self-pay | Admitting: Cardiovascular Disease

## 2012-02-21 ENCOUNTER — Other Ambulatory Visit: Payer: Self-pay

## 2012-02-21 MED ORDER — LABETALOL HCL 300 MG PO TABS: ORAL_TABLET | ORAL | Status: DC

## 2012-02-21 NOTE — Telephone Encounter (Signed)
Refill- F/U  Patient called to f/u on labetalol (NORMODYNE) 300 MG tablet refill he requested on 4/8.  Notes show this medication was ordered and [facility].  Patient can be reached at for additional information [phone]  Summary: 2 tabs daily, Print Start: 02/20/2012  Ord/Sold: 02/20/2012 (O)  Report  Taking:  Long-term:   [id number] [place],  - [address]  Med Dose History  Change   Patient Sig: 2 tabs daily   Ordered on: 02/20/2012   Authorized by: Wendall Stade   Dispense: 180 tablet   Admin Instructions: 2 tabs daily

## 2012-02-21 NOTE — Telephone Encounter (Signed)
Called meds in again and called pt he didn't answer .... i left a message on his phone stating everything was taken care of and if he had any questions please give me a call.

## 2012-02-22 MED ORDER — LABETALOL HCL 300 MG PO TABS: ORAL_TABLET | ORAL | Status: DC

## 2012-03-01 ENCOUNTER — Other Ambulatory Visit: Payer: Self-pay | Admitting: Cardiovascular Disease

## 2012-03-01 ENCOUNTER — Telehealth: Payer: Self-pay | Admitting: Internal Medicine

## 2012-03-01 MED ORDER — DOXAZOSIN MESYLATE 4 MG PO TABS: 4.0000 mg | ORAL_TABLET | Freq: Every day | ORAL | Status: DC

## 2012-03-01 NOTE — Telephone Encounter (Signed)
Caller: Theodore [name]; PCP: Marga Melnick; CB#: [phone]; Call regarding Abd Pain; Mid abd cramps onset 2200 02/29/12. No vomiting or diarrhea, feels as he has bad indigestion, needs to have a BM but cannot. Last BM on 02/29/12. Sleeping now. Advised see now per Abd Pain Protocol. Pt comes to the phone. He politely declines an appt. RN advised an appt today with his sx but he still declines. Will call back if sx worsen.

## 2012-03-22 ENCOUNTER — Telehealth: Payer: Self-pay | Admitting: Cardiovascular Disease

## 2012-03-22 NOTE — Telephone Encounter (Signed)
Mr brownlow calling and wanting to know if OK to change to slow niacin--he will save 68dollars a month if he can switch--per dr nishan--OK to switch to slow niacin--this will be [facility] by [facility]--nt

## 2012-03-22 NOTE — Telephone Encounter (Signed)
Please return call to patient at [phone], [facility] Please return call ASAP as appointment with [facility] is tomorrow.

## 2012-04-19 ENCOUNTER — Other Ambulatory Visit: Payer: Self-pay | Admitting: Urology

## 2012-04-19 DIAGNOSIS — C61 Malignant neoplasm of prostate: Secondary | ICD-10-CM

## 2012-05-01 ENCOUNTER — Ambulatory Visit (HOSPITAL_COMMUNITY): Payer: Medicare Other

## 2012-05-01 ENCOUNTER — Encounter (HOSPITAL_COMMUNITY): Payer: Medicare Other

## 2012-05-18 ENCOUNTER — Other Ambulatory Visit: Payer: Self-pay | Admitting: Cardiovascular Disease

## 2012-05-18 MED ORDER — NIACIN ER (ANTIHYPERLIPIDEMIC) 1000 MG PO TBCR: EXTENDED_RELEASE_TABLET | ORAL | Status: DC

## 2012-05-18 NOTE — Telephone Encounter (Signed)
Out of pills 

## 2012-05-18 NOTE — Telephone Encounter (Signed)
Fax Received. Refill Completed. Theodore [name] (R.M.A)   

## 2012-05-28 ENCOUNTER — Encounter (HOSPITAL_COMMUNITY): Payer: Medicare Other

## 2012-05-28 ENCOUNTER — Ambulatory Visit (HOSPITAL_COMMUNITY): Payer: Medicare Other

## 2012-05-29 ENCOUNTER — Ambulatory Visit (HOSPITAL_COMMUNITY)
Admission: RE | Admit: 2012-05-29 | Discharge: 2012-05-29 | Disposition: A | Discharge status: Home / Self Care | Payer: Medicare Other | Source: Ambulatory Visit | Attending: Urology | Admitting: Urology

## 2012-05-29 ENCOUNTER — Encounter (HOSPITAL_COMMUNITY)
Admission: RE | Admit: 2012-05-29 | Discharge: 2012-05-29 | Disposition: A | Discharge status: Home / Self Care | Payer: Medicare Other | Source: Ambulatory Visit | Attending: Urology | Admitting: Urology

## 2012-05-29 DIAGNOSIS — C61 Malignant neoplasm of prostate: Secondary | ICD-10-CM | POA: Insufficient documentation

## 2012-05-29 MED ORDER — TECHNETIUM TC 99M MEDRONATE IV KIT
25.0000 | PACK | Freq: Once | INTRAVENOUS | Status: AC | PRN
  Administered 2012-05-29: 25 via INTRAVENOUS

## 2012-06-21 ENCOUNTER — Encounter: Payer: Self-pay | Admitting: Internal Medicine

## 2012-06-21 ENCOUNTER — Ambulatory Visit (INDEPENDENT_AMBULATORY_CARE_PROVIDER_SITE_OTHER): Payer: Medicare Other | Admitting: Internal Medicine

## 2012-06-21 VITALS — BP 130/70 | HR 95 | Resp 14 | Ht 66.0 in | Wt 265.6 lb

## 2012-06-21 DIAGNOSIS — Z23 Encounter for immunization: Secondary | ICD-10-CM

## 2012-06-21 DIAGNOSIS — I10 Essential (primary) hypertension: Secondary | ICD-10-CM

## 2012-06-21 DIAGNOSIS — E119 Type 2 diabetes mellitus without complications: Secondary | ICD-10-CM

## 2012-06-21 MED ORDER — METFORMIN HCL ER 500 MG PO TB24: ORAL_TABLET | ORAL | Status: DC

## 2012-06-21 NOTE — Patient Instructions (Addendum)
The most common cause of elevated triglycerides is the ingestion of sugar from high fructose corn syrup sources added to processed foods & drinks.  Eat a low-fat diet with lots of fruits and vegetables, up to 7-9 servings per day. Consume less than 40 (preferably ZERO) grams of sugar per day from foods & drinks with High Fructose Corn Syrup (HFCS) sugar as #1,2,3 or # 4 on label.Whole Foods, [employer] & [employer] do not carry products with HFCS. Follow a  low carb nutrition program such as The Fat Belly Diet or The New Sugar Busters  to prevent Diabetes progression . White carbohydrates (potatoes, rice, bread, and pasta) have a high spike of sugar and a high load of sugar. For example a  baked potato has a cup of sugar and a  french fry  2 teaspoons of sugar. Yams, wild  rice, whole grained bread &  wheat pasta have been much lower spike and load of  sugar. Portions should be the size of a deck of cards or your palm. Please try to go on My Chart  to allow me to release the results directly to you.  Please sign a release of records for review of pertinent information from [facility]. Please   Labs  In 6-8 weeks: BUN,creat,K+,A1c, urine moicroalbumin.PLEASE BRING THESE INSTRUCTIONS TO FOLLOW UP  LAB APPOINTMENT.This will guarantee correct labs are drawn, eliminating need for repeat blood sampling ( needle sticks ! ). Diagnoses /Codes: 250.02

## 2012-06-21 NOTE — Assessment & Plan Note (Signed)
Blood pressure is well controlled; no change indicated. Renal function status will need to be verified

## 2012-06-21 NOTE — Assessment & Plan Note (Signed)
Nutritional interventions will be discussed. Metformin twice a day will be initiated. Followup A1c with BUN, creatinine will be performed in 6-8 weeks
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
## 2012-08-13 ENCOUNTER — Other Ambulatory Visit: Payer: Self-pay

## 2012-08-13 MED ORDER — GLUCOSE BLOOD VI STRP: ORAL_STRIP | Status: DC

## 2012-08-13 MED ORDER — ONETOUCH DELICA LANCETS MISC: Status: DC

## 2012-08-13 NOTE — Telephone Encounter (Signed)
Rx sent pt aware.   MW  

## 2012-08-14 ENCOUNTER — Other Ambulatory Visit: Payer: Self-pay

## 2012-08-14 NOTE — Telephone Encounter (Signed)
Spoke with pharmacy gave them ICD9 code 250.00 for lancets and strips for pt. Rx done now.         MW

## 2012-08-14 NOTE — Telephone Encounter (Signed)
Spoke with pt. Pt states pharmacy still not approving. Called pharmacy, pharmacy states we provided everything they are just waiting on medicare to approve Rx.   Spoke to pt to advise. Pt stated understanding.       MW

## 2012-08-14 NOTE — Telephone Encounter (Signed)
RX sent to pharmacy  

## 2012-08-14 NOTE — Telephone Encounter (Signed)
Pt called in again concerning test strips and lancets pharmacy says they don't have. Can you resend.       MW

## 2012-08-14 NOTE — Telephone Encounter (Signed)
I have been calling pharmacy most of the day and being put on hold for long periods of time. Not once able to speak to pharmacy concerning pt Rx. Pt called in concerned about Rx not being filled. i was calling pharmacy to verify.       MW

## 2013-02-18 ENCOUNTER — Telehealth: Payer: Self-pay | Admitting: Internal Medicine

## 2013-02-18 NOTE — Telephone Encounter (Signed)
Patient Information:  Caller Name: Dois Davenport  Phone: [phone]  Patient: Inaki, Vantine  Gender: Male  DOB: Feb 14, 1946  Age: 67 Years  PCP: Marga Melnick  Office Follow Up:  Does the office need to follow up with this patient?: No  Instructions For The Office: N/A  RN Note:  Has a lump on the left side of his abdomen.  Lump is under the skin and approx the size of a baseball.  C/o constant abdominal pain.  Pain is very severe.  Symptoms  Reason For Call & Symptoms: Lump on the left side of his abdomen  Reviewed Health History In EMR: Yes  Reviewed Medications In EMR: Yes  Reviewed Allergies In EMR: Yes  Reviewed Surgeries / Procedures: Yes  Date of Onset of Symptoms: 02/13/2013  Guideline(s) Used:  Abdominal Pain - Male  Disposition Per Guideline:   Go to ED Now  Reason For Disposition Reached:   Severe abdominal pain (e.g., excruciating)  Advice Given:  N/A  Patient Will Follow Care Advice:  YES

## 2013-02-18 NOTE — Telephone Encounter (Signed)
Patient advised to go to ER, per MD protocol if appointment scheduled or patient sent to ER or Urgent Care ok to close encounter

## 2013-02-19 ENCOUNTER — Encounter (HOSPITAL_COMMUNITY): Payer: Self-pay

## 2013-02-19 ENCOUNTER — Emergency Department (HOSPITAL_COMMUNITY)
Admission: EM | Admit: 2013-02-19 | Discharge: 2013-02-19 | Disposition: A | Discharge status: Home / Self Care | Payer: Medicare Other | Attending: Emergency Medicine | Admitting: Emergency Medicine

## 2013-02-19 ENCOUNTER — Emergency Department (HOSPITAL_COMMUNITY): Payer: Medicare Other

## 2013-02-19 DIAGNOSIS — K5792 Diverticulitis of intestine, part unspecified, without perforation or abscess without bleeding: Secondary | ICD-10-CM

## 2013-02-19 DIAGNOSIS — I252 Old myocardial infarction: Secondary | ICD-10-CM | POA: Insufficient documentation

## 2013-02-19 DIAGNOSIS — E669 Obesity, unspecified: Secondary | ICD-10-CM | POA: Insufficient documentation

## 2013-02-19 DIAGNOSIS — Z8546 Personal history of malignant neoplasm of prostate: Secondary | ICD-10-CM | POA: Insufficient documentation

## 2013-02-19 DIAGNOSIS — Z9089 Acquired absence of other organs: Secondary | ICD-10-CM | POA: Insufficient documentation

## 2013-02-19 DIAGNOSIS — Z79899 Other long term (current) drug therapy: Secondary | ICD-10-CM | POA: Insufficient documentation

## 2013-02-19 DIAGNOSIS — Z8709 Personal history of other diseases of the respiratory system: Secondary | ICD-10-CM | POA: Insufficient documentation

## 2013-02-19 DIAGNOSIS — Z7982 Long term (current) use of aspirin: Secondary | ICD-10-CM | POA: Insufficient documentation

## 2013-02-19 DIAGNOSIS — I10 Essential (primary) hypertension: Secondary | ICD-10-CM | POA: Insufficient documentation

## 2013-02-19 DIAGNOSIS — E119 Type 2 diabetes mellitus without complications: Secondary | ICD-10-CM | POA: Insufficient documentation

## 2013-02-19 DIAGNOSIS — R32 Unspecified urinary incontinence: Secondary | ICD-10-CM | POA: Insufficient documentation

## 2013-02-19 DIAGNOSIS — K5732 Diverticulitis of large intestine without perforation or abscess without bleeding: Secondary | ICD-10-CM | POA: Insufficient documentation

## 2013-02-19 DIAGNOSIS — Z87891 Personal history of nicotine dependence: Secondary | ICD-10-CM | POA: Insufficient documentation

## 2013-02-19 DIAGNOSIS — E785 Hyperlipidemia, unspecified: Secondary | ICD-10-CM | POA: Insufficient documentation

## 2013-02-19 DIAGNOSIS — Z8679 Personal history of other diseases of the circulatory system: Secondary | ICD-10-CM | POA: Insufficient documentation

## 2013-02-19 DIAGNOSIS — Z872 Personal history of diseases of the skin and subcutaneous tissue: Secondary | ICD-10-CM | POA: Insufficient documentation

## 2013-02-19 HISTORY — DX: Diverticulitis of intestine, part unspecified, without perforation or abscess without bleeding: K57.92

## 2013-02-19 LAB — LIPASE, BLOOD: Lipase: 20 U/L (ref 11–59)

## 2013-02-19 LAB — CBC WITH DIFFERENTIAL/PLATELET
Eosinophils Absolute: 0.4 10*3/uL (ref 0.0–0.7)
Eosinophils Relative: 7 % — ABNORMAL HIGH (ref 0–5)
Hemoglobin: 13.6 g/dL (ref 13.0–17.0)
Lymphs Abs: 2.1 10*3/uL (ref 0.7–4.0)
MCH: 32.1 pg (ref 26.0–34.0)
MCHC: 35 g/dL (ref 30.0–36.0)
MCV: 91.7 fL (ref 78.0–100.0)
Monocytes Relative: 9 % (ref 3–12)
RBC: 4.24 MIL/uL (ref 4.22–5.81)

## 2013-02-19 LAB — COMPREHENSIVE METABOLIC PANEL
BUN: 15 mg/dL (ref 6–23)
Calcium: 9.1 mg/dL (ref 8.4–10.5)
Creatinine, Ser: 0.73 mg/dL (ref 0.50–1.35)
GFR calc Af Amer: >90 mL/min (ref >90)
Glucose, Bld: 131 mg/dL — ABNORMAL HIGH (ref 70–99)
Total Protein: 6.4 g/dL (ref 6.0–8.3)

## 2013-02-19 MED ORDER — IOHEXOL 300 MG/ML  SOLN
100.0000 mL | Freq: Once | INTRAMUSCULAR | Status: AC | PRN
  Administered 2013-02-19: 100 mL via INTRAVENOUS

## 2013-02-19 MED ORDER — IOHEXOL 300 MG/ML  SOLN
50.0000 mL | Freq: Once | INTRAMUSCULAR | Status: AC | PRN
  Administered 2013-02-19: 50 mL via ORAL

## 2013-02-19 MED ORDER — NAPROXEN 500 MG PO TABS: 500.0000 mg | ORAL_TABLET | Freq: Two times a day (BID) | ORAL | Status: DC

## 2013-02-19 MED ORDER — METRONIDAZOLE 500 MG PO TABS
500.0000 mg | ORAL_TABLET | Freq: Once | ORAL | Status: AC
  Administered 2013-02-19: 500 mg via ORAL
  Filled 2013-02-19: qty 1

## 2013-02-19 MED ORDER — CIPROFLOXACIN HCL 500 MG PO TABS: 500.0000 mg | ORAL_TABLET | Freq: Two times a day (BID) | ORAL | Status: DC

## 2013-02-19 MED ORDER — PROMETHAZINE HCL 25 MG PO TABS: 25.0000 mg | ORAL_TABLET | Freq: Four times a day (QID) | ORAL | Status: DC | PRN

## 2013-02-19 MED ORDER — ONDANSETRON HCL 4 MG/2ML IJ SOLN
4.0000 mg | Freq: Once | INTRAMUSCULAR | Status: AC
  Administered 2013-02-19: 4 mg via INTRAVENOUS
  Filled 2013-02-19: qty 2

## 2013-02-19 MED ORDER — HYDROMORPHONE HCL PF 1 MG/ML IJ SOLN
1.0000 mg | Freq: Once | INTRAMUSCULAR | Status: AC
  Administered 2013-02-19: 1 mg via INTRAVENOUS
  Filled 2013-02-19: qty 1

## 2013-02-19 MED ORDER — OXYCODONE-ACETAMINOPHEN 5-325 MG PO TABS: 1.0000 | ORAL_TABLET | ORAL | Status: DC | PRN

## 2013-02-19 MED ORDER — CIPROFLOXACIN IN D5W 400 MG/200ML IV SOLN
400.0000 mg | Freq: Once | INTRAVENOUS | Status: AC
  Administered 2013-02-19: 400 mg via INTRAVENOUS
  Filled 2013-02-19: qty 200

## 2013-02-19 MED ORDER — METRONIDAZOLE 500 MG PO TABS: 500.0000 mg | ORAL_TABLET | Freq: Two times a day (BID) | ORAL | Status: DC

## 2013-02-19 NOTE — ED Notes (Signed)
I have asked patient x2 if he needed to urinate, he said no x2. Will reassess

## 2013-02-19 NOTE — ED Notes (Signed)
Pt c/o knot to mid abdomen since last Thursday, states abdomen is still tight "feels like something is pulling his muscles", denies n/v/d
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
## 2013-02-20 ENCOUNTER — Telehealth: Payer: Self-pay | Admitting: Internal Medicine

## 2013-02-20 MED ORDER — TRAMADOL HCL 50 MG PO TABS: ORAL_TABLET | ORAL | Status: DC

## 2013-02-20 NOTE — Telephone Encounter (Signed)
Opened an error

## 2013-02-20 NOTE — Telephone Encounter (Signed)
Patient Information:  Caller Name: [name]  Phone: [phone]  Patient: Theodore [name], Theodore [name]  Gender: Male  DOB: Aug 27, 1946  Age: 67 Years  PCP: Marga Melnick  Office Follow Up:  Does the office need to follow up with this patient?: Yes  Instructions For The Office: Call Patient back with Dr. Frederik Pear answer to his medication question.  RN Note:  Advised Patient that a note would be sent to Dr. Alwyn Ren and that someone would get back to him as soon as possible. Also Advised not to take the Naprosyn until he heard back from the office and if does not here back by later today or tomorrow morning, or if he needs to take something that is not as strong as the Percocet, to call the office back. Patient Agreed. Note Sent vis EPIC.  Symptoms  Reason For Call & Symptoms: Patient was seen at [facility] ER yesterday per Dr. Frederik Pear recommendation. He was diagnosed with Diverticulitis and was given prescriptions for :Ciprofloxacin twice a day for 10 days; Flagyl twice a day for 10 days; Phenergan as needed for nausea; Naprosyn twice a day for pain and Percocet for severe pain. Patient is allergic to Sulfa Drugs and when his wife went to pick up the medication the Pharmacist told her that the Naprosyn could have a cross sensitivity because it has a Sulfa Derivative as one of the ingredients. Patient states that he does not use pain medication anymore than he needs to, but the Pharmacist told him to check with his PCP to see if it was OK to use the Naprosyn and if not is there something else he can use for mild pain. (i.e.Acetaminophen or Ibuprofen). Patient does take a 325 mg ASA every day.  Reviewed Health History In EMR: Yes  Reviewed Medications In EMR: Yes  Reviewed Allergies In EMR: Yes  Reviewed Surgeries / Procedures: Yes  Date of Onset of Symptoms: 02/20/2013  Guideline(s) Used:  No Protocol Available - Sick Adult  Disposition Per Guideline:   Discuss with PCP and Callback by Nurse within 1  Hour  Reason For Disposition Reached:   Nursing judgment  Advice Given:  N/A  Patient Will Follow Care Advice:  YES

## 2013-02-20 NOTE — Telephone Encounter (Signed)
I recommend tramadol 50 mg one half-1 every 6-8 hours as needed, dispense 30

## 2013-02-20 NOTE — Telephone Encounter (Signed)
Discuss with patient, Rx sent. 

## 2013-02-20 NOTE — Telephone Encounter (Signed)
Hopp please advise  

## 2013-02-21 ENCOUNTER — Encounter: Payer: Self-pay | Admitting: Lab

## 2013-02-22 ENCOUNTER — Ambulatory Visit (INDEPENDENT_AMBULATORY_CARE_PROVIDER_SITE_OTHER): Payer: Medicare Other | Admitting: Internal Medicine

## 2013-02-22 ENCOUNTER — Encounter: Payer: Self-pay | Admitting: Internal Medicine

## 2013-02-22 VITALS — BP 128/80 | HR 72 | Temp 97.6°F | Wt 251.0 lb

## 2013-02-22 DIAGNOSIS — R197 Diarrhea, unspecified: Secondary | ICD-10-CM

## 2013-02-22 DIAGNOSIS — K5732 Diverticulitis of large intestine without perforation or abscess without bleeding: Secondary | ICD-10-CM

## 2013-02-22 DIAGNOSIS — E119 Type 2 diabetes mellitus without complications: Secondary | ICD-10-CM

## 2013-02-22 DIAGNOSIS — N200 Calculus of kidney: Secondary | ICD-10-CM | POA: Insufficient documentation

## 2013-02-22 DIAGNOSIS — K5730 Diverticulosis of large intestine without perforation or abscess without bleeding: Secondary | ICD-10-CM | POA: Insufficient documentation

## 2013-02-22 DIAGNOSIS — K5792 Diverticulitis of intestine, part unspecified, without perforation or abscess without bleeding: Secondary | ICD-10-CM

## 2013-02-22 DIAGNOSIS — Z5189 Encounter for other specified aftercare: Secondary | ICD-10-CM

## 2013-02-22 LAB — HEMOGLOBIN A1C
Hgb A1c MFr Bld: 6.6 % — ABNORMAL HIGH (ref <5.7)
Mean Plasma Glucose: 143 mg/dL — ABNORMAL HIGH (ref <117)

## 2013-02-22 MED ORDER — MUPIROCIN 2 % EX OINT: TOPICAL_OINTMENT | CUTANEOUS | Status: DC

## 2013-02-22 NOTE — Addendum Note (Signed)
Addended by: Silvio Pate D on: 02/22/2013 04:17 PM   Modules accepted: Orders
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
## 2013-02-22 NOTE — Patient Instructions (Addendum)
Please take the probiotic , Align, every day if stools are loose or watery. This will replace the normal bacteria which  are necessary for formation of normal stool and processing of food. Dip gauze in  sterile saline and applied to the wounds twice a day. Cover the wound with Telfa , non stick dressing  without any antibiotic ointment. The saline can be purchased at the drugstore or you can make your own .Boil cup of salt in a gallon of water. Store mixture  in a clean container.Report Warning  signs as discussed (red streaks, pus, fever, increasing pain). Apply the Bactroban 2  times a day if there is increasing redness or any pus formation.  If you activate the  My Chart system; lab & Xray results will be released directly  to you as soon as I review & address these through the computer. If you choose not to sign up for My Chart within 36 hours of labs being drawn; results will be reviewed & interpretation added before being copied & mailed, causing a delay in getting the results to you.If you do not receive that report within 7-10 days ,please call. Additionally you can use this system to gain direct  access to your records  if  out of town or @ an office of a  physician who is not in  the My Chart network.  This improves continuity of care & places you in control of your medical record.

## 2013-02-26 ENCOUNTER — Encounter: Payer: Self-pay | Admitting: *Deleted

## 2013-04-03 ENCOUNTER — Telehealth: Payer: Self-pay | Admitting: Internal Medicine

## 2013-04-03 NOTE — Telephone Encounter (Signed)
I personally reviewed a telephone message 02/18/13 created by [facility] based on Standard of Care Protocols .  The history of a 67 year old male with a baseball-sized lump in the left abdomen associated with constant abdominal pain which was rated as severe in the context of a past history of diverticulosis, prostate cancer, and myocardial infarction warranted assessment in the emergency room.  Based on review of the emergency room physician's note he also felt this was appropriate. The patient was found to have tenderness on the left with guarding. He was also found to be anemic. Imaging revealed acute diverticulitis.  Had the patient been seen in the office he would would had been referred to the emergency room increasing delay and cost of care and potentially adversely  affecting  his health.

## 2013-04-03 NOTE — Telephone Encounter (Signed)
Pt called because he states that he was seen in the er on 02/18/2013 and his insurance denied claim. Pt states that he called our office on that date of service and he was told to go to the er but his insurance states that was not an er visit. Pt would like a letter stating that he was told to go to the er and not to be seen our office. Thanks.

## 2013-04-05 NOTE — Telephone Encounter (Signed)
Patient called back regarding letter he needs for his insurance company. Patient would like to know when this is ready.

## 2013-04-10 ENCOUNTER — Telehealth: Payer: Self-pay | Admitting: *Deleted

## 2013-04-10 ENCOUNTER — Encounter: Payer: Self-pay | Admitting: *Deleted

## 2013-04-10 NOTE — Telephone Encounter (Signed)
Spoke with patient to inform him that letter he requested for insurance company was ready to be picked up. He will come to the office tomorrow to pick it up.

## 2013-06-19 ENCOUNTER — Other Ambulatory Visit: Payer: Self-pay

## 2013-08-06 ENCOUNTER — Encounter: Payer: Self-pay | Admitting: Internal Medicine

## 2013-09-04 ENCOUNTER — Ambulatory Visit: Payer: Medicare Other | Admitting: Internal Medicine

## 2013-09-04 ENCOUNTER — Telehealth: Payer: Self-pay | Admitting: Internal Medicine

## 2013-09-19 ENCOUNTER — Other Ambulatory Visit: Payer: Self-pay

## 2014-01-13 NOTE — Telephone Encounter (Signed)
Closed Encounter  °

## 2014-04-03 ENCOUNTER — Encounter: Payer: Self-pay | Admitting: Internal Medicine

## 2014-04-03 ENCOUNTER — Ambulatory Visit (INDEPENDENT_AMBULATORY_CARE_PROVIDER_SITE_OTHER): Payer: Medicare Other | Admitting: Internal Medicine

## 2014-04-03 VITALS — BP 120/80 | HR 68 | Temp 97.1°F | Ht 66.0 in | Wt 245.5 lb

## 2014-04-03 DIAGNOSIS — L03119 Cellulitis of unspecified part of limb: Principal | ICD-10-CM | POA: Insufficient documentation

## 2014-04-03 DIAGNOSIS — L02419 Cutaneous abscess of limb, unspecified: Secondary | ICD-10-CM

## 2014-04-03 DIAGNOSIS — I10 Essential (primary) hypertension: Secondary | ICD-10-CM

## 2014-04-03 DIAGNOSIS — E119 Type 2 diabetes mellitus without complications: Secondary | ICD-10-CM

## 2014-04-03 MED ORDER — DOXYCYCLINE HYCLATE 100 MG PO TABS: 100.0000 mg | ORAL_TABLET | Freq: Two times a day (BID) | ORAL | Status: DC

## 2014-04-03 NOTE — Patient Instructions (Signed)
Please take all new medication as prescribed - the antibiotic  Please keep a dry dressing on daily until healed  Please call in 3-5 days if not improved, for referral to Wound Clinic  Please continue all other medications as before, and refills have been done if requested. Please have the pharmacy call with any other refills you may need.

## 2014-04-03 NOTE — Progress Notes (Signed)
Subjective:    Patient ID: Theodore [name], male    DOB: [birth year], 68 y.o.   MRN: [id number]  HPI  Here with acute onset 3 days gradually worsening red/tender/swelling now several cm wide after presumed insect bite;   Pt denies fever, wt loss, night sweats, loss of appetite, or other constitutional symptoms  No red streaks, drainage.  Pt denies chest pain, increased sob or doe, wheezing, orthopnea, PND, increased LE swelling, palpitations, dizziness or syncope.  Pt denies new neurological symptoms such as new headache, or facial or extremity weakness or numbness   Pt denies polydipsia, polyuria,  Past Medical History  Diagnosis Date  . PVD (peripheral vascular disease)   . Myocardial infarction     inferior wall  . Hyperlipidemia   . Hypertension   . Diabetes mellitus     controlled  . Cancer     prostate  . Paroxysmal nocturnal dyspnea   . Cellulitis 10/18-20/2012    RLE   . Diverticulitis 02/19/13   Past Surgical History  Procedure Laterality Date  . Prostate surgery    . Penile prosthesis  removal      swich(sphincter)...removed 04-2008, replaced 02-2009  . Cholecystectomy    . Tonsillectomy    . Cervical disc surgery  1988    reports that he quit smoking about 27 years ago. He does not have any smokeless tobacco history on file. He reports that he does not drink alcohol or use illicit drugs. family history includes Gout in his brother; Kidney disease in his brother; Seizures in his father. Allergies  Allergen Reactions  . Sulfonamide Derivatives     "reaction" as infant according to his mother   Current Outpatient Prescriptions on File Prior to Visit  Medication Sig Dispense Refill  . aspirin 325 MG tablet Take 325 mg by mouth every evening.       Marland Kitchen atorvastatin (LIPITOR) 20 MG tablet Take 10 mg by mouth at bedtime.       Marland Kitchen doxazosin (CARDURA) 8 MG tablet Take 4 mg by mouth daily. 1/2 by mouth daily      . furosemide (LASIX) 20 MG tablet Take 1 tablet (20 mg total) by  mouth 2 (two) times daily.  180 tablet  3  . Leuprolide Acetate (LUPRON IJ) Inject as directed. 1 injection every 4 months       . losartan (COZAAR) 100 MG tablet Take 50 mg by mouth daily. 1/2 BY MOUTH DAILY      . metFORMIN (GLUCOPHAGE) 500 MG tablet Take 500 mg by mouth 2 (two) times daily with a meal.      . metoprolol (LOPRESSOR) 100 MG tablet Take 100 mg by mouth 2 (two) times daily.      . Multiple Vitamins-Minerals (CENTRUM SILVER PO) Take 1 tablet by mouth daily.       . niacin (SLO-NIACIN) 500 MG tablet Take 2,000 mg by mouth at bedtime. 4 BY MOUTH AT BEDTIME       No current facility-administered medications on file prior to visit.   Review of Systems  Constitutional: Negative for unusual diaphoresis or other sweats  HENT: Negative for ringing in ear Eyes: Negative for double vision or worsening visual disturbance.  Respiratory: Negative for choking and stridor.   Gastrointestinal: Negative for vomiting or other signifcant bowel change Genitourinary: Negative for hematuria or decreased urine volume.  Musculoskeletal: Negative for other MSK pain or swelling Skin: Negative for color change and worsening wound.  Neurological: Negative for tremors  and numbness other than noted  Psychiatric/Behavioral: Negative for decreased concentration or agitation other than above       Objective:   Physical Exam BP 120/80  Pulse 68  Temp(Src) 97.1 F (36.2 C) (Oral)  Ht 5\' 6"  (1.676 m)  Wt 245 lb 8 oz (111.358 kg)  BMI 39.64 kg/m2  SpO2 94% VS noted, not ill appearing Constitutional: Pt appears well-developed, well-nourished.  HENT: Head: NCAT.  Right Ear: External ear normal.  Left Ear: External ear normal.  Eyes: . Pupils are equal, round, and reactive to light. Conjunctivae and EOM are normal Neck: Normal range of motion. Neck supple.  Cardiovascular: Normal rate and regular rhythm.   Pulmonary/Chest: Effort normal and breath sounds normal.  Neurological: Pt is alert. Not  confused , motor grossly intac Skin: post right thigh with 3 cm area red/tender/sweling with induration, no fluctuance, or drainage but with 1 cm central shallow ulcer Psychiatric: Pt behavior is normal. No agitation.     Assessment & Plan:

## 2014-04-03 NOTE — Progress Notes (Signed)
Pre visit review using our clinic review tool, if applicable. No additional management support is needed unless otherwise documented below in the visit note. 

## 2014-04-07 NOTE — Assessment & Plan Note (Signed)
Mild to mod, for antibx course,  to f/u any worsening symptoms or concerns 

## 2014-04-07 NOTE — Assessment & Plan Note (Signed)
stable overall by history and exam, recent data reviewed with pt, and pt to continue medical treatment as before,  to f/u any worsening symptoms or concerns Lab Results  Component Value Date   HGBA1C 6.6* 02/22/2013

## 2014-04-07 NOTE — Assessment & Plan Note (Signed)
stable overall by history and exam, recent data reviewed with pt, and pt to continue medical treatment as before,  to f/u any worsening symptoms or concerns BP Readings from Last 3 Encounters:  04/03/14 120/80  02/22/13 128/80  02/19/13 124/62

## 2014-04-09 ENCOUNTER — Other Ambulatory Visit: Payer: Self-pay | Admitting: Urology

## 2014-04-09 DIAGNOSIS — C61 Malignant neoplasm of prostate: Secondary | ICD-10-CM

## 2014-04-23 ENCOUNTER — Encounter (HOSPITAL_COMMUNITY)
Admission: RE | Admit: 2014-04-23 | Discharge: 2014-04-23 | Disposition: A | Discharge status: Home / Self Care | Payer: Medicare Other | Source: Ambulatory Visit | Attending: Urology | Admitting: Urology

## 2014-04-23 DIAGNOSIS — M949 Disorder of cartilage, unspecified: Secondary | ICD-10-CM

## 2014-04-23 DIAGNOSIS — C61 Malignant neoplasm of prostate: Secondary | ICD-10-CM

## 2014-04-23 DIAGNOSIS — M899 Disorder of bone, unspecified: Secondary | ICD-10-CM | POA: Insufficient documentation

## 2014-04-23 MED ORDER — TECHNETIUM TC 99M MEDRONATE IV KIT
26.1000 | PACK | Freq: Once | INTRAVENOUS | Status: AC | PRN
  Administered 2014-04-23: 26.1 via INTRAVENOUS

## 2014-05-22 ENCOUNTER — Other Ambulatory Visit: Payer: Self-pay | Admitting: Urology

## 2014-05-22 DIAGNOSIS — E291 Testicular hypofunction: Secondary | ICD-10-CM

## 2014-06-09 ENCOUNTER — Ambulatory Visit
Admission: RE | Admit: 2014-06-09 | Discharge: 2014-06-09 | Disposition: A | Discharge status: Home / Self Care | Payer: Medicare Other | Source: Ambulatory Visit | Attending: Urology | Admitting: Urology

## 2014-06-09 DIAGNOSIS — E291 Testicular hypofunction: Secondary | ICD-10-CM

## 2014-06-25 ENCOUNTER — Telehealth: Payer: Self-pay

## 2014-06-25 NOTE — Telephone Encounter (Signed)
Spoke with pt and he was not ready to schedule 6 mo. Follow up.

## 2014-07-29 ENCOUNTER — Ambulatory Visit: Payer: Medicare Other | Admitting: Internal Medicine

## 2014-07-29 DIAGNOSIS — Z0289 Encounter for other administrative examinations: Secondary | ICD-10-CM

## 2014-07-29 DIAGNOSIS — R4689 Other symptoms and signs involving appearance and behavior: Secondary | ICD-10-CM | POA: Insufficient documentation

## 2014-11-20 ENCOUNTER — Ambulatory Visit: Payer: Medicare Other | Admitting: Family Medicine

## 2015-02-17 ENCOUNTER — Encounter: Payer: Self-pay | Admitting: Internal Medicine

## 2015-04-08 ENCOUNTER — Encounter: Payer: Self-pay | Admitting: Internal Medicine

## 2015-04-08 ENCOUNTER — Ambulatory Visit (INDEPENDENT_AMBULATORY_CARE_PROVIDER_SITE_OTHER)
Admission: RE | Admit: 2015-04-08 | Discharge: 2015-04-08 | Disposition: A | Discharge status: Home / Self Care | Payer: Medicare Other | Source: Ambulatory Visit | Attending: Internal Medicine | Admitting: Internal Medicine

## 2015-04-08 ENCOUNTER — Ambulatory Visit (INDEPENDENT_AMBULATORY_CARE_PROVIDER_SITE_OTHER): Payer: Medicare Other | Admitting: Internal Medicine

## 2015-04-08 VITALS — BP 106/56 | HR 72 | Ht 65.25 in | Wt 246.5 lb

## 2015-04-08 DIAGNOSIS — M6289 Other specified disorders of muscle: Secondary | ICD-10-CM

## 2015-04-08 DIAGNOSIS — K5641 Fecal impaction: Secondary | ICD-10-CM

## 2015-04-08 DIAGNOSIS — R159 Full incontinence of feces: Secondary | ICD-10-CM | POA: Diagnosis not present

## 2015-04-08 DIAGNOSIS — Z1211 Encounter for screening for malignant neoplasm of colon: Secondary | ICD-10-CM

## 2015-04-08 NOTE — Progress Notes (Signed)
Referred by Dr.Steven  Dahlstedt Subjective:    Patient ID: Theodore [name], male    DOB: 1946-11-04, 69 y.o.   MRN: [id number] Chief Complaint: Diarrhea HPI The patient is a 69 year old white man here with his wife. He says he's had problems with bowel movements since radiation therapy and prostatectomy for prostate cancer a long time ago. This was many years ago. He's had urgent defecation and loose stools. It was a problem prior to cholecystectomy that might be worse since then. He wears a diaper because of fecal incontinence. He cannot predict when stools will come in their urgent. He will use loperamide intermittently with relief. However he will not move his bowels for several days and then have blowout says he says. He has never had a routine colonoscopy. His prostate cancer has recurred and he is on Lupron. There is no rectal bleeding or rectal pain. He is not having unintentional weight loss. It was recommended that he try align or biotic but he thought that was too costly and did not take it. Allergies  Allergen Reactions  . Sulfonamide Derivatives     "reaction" as infant according to his mother   Outpatient Prescriptions Prior to Visit  Medication Sig Dispense Refill  . aspirin 325 MG tablet Take 325 mg by mouth every evening.     Marland Kitchen atorvastatin (LIPITOR) 20 MG tablet Take 10 mg by mouth at bedtime.     Marland Kitchen doxazosin (CARDURA) 8 MG tablet Take 4 mg by mouth daily. 1/2 by mouth daily    . furosemide (LASIX) 20 MG tablet Take 1 tablet (20 mg total) by mouth 2 (two) times daily. 180 tablet 3  . Leuprolide Acetate (LUPRON IJ) Inject as directed. 1 injection every 4 months     . losartan (COZAAR) 100 MG tablet Take 50 mg by mouth daily. 1/2 BY MOUTH DAILY    . metFORMIN (GLUCOPHAGE) 500 MG tablet Take 500 mg by mouth 2 (two) times daily with a meal.    . metoprolol (LOPRESSOR) 100 MG tablet Take 100 mg by mouth 2 (two) times daily.    . Multiple Vitamins-Minerals (CENTRUM SILVER PO) Take 1  tablet by mouth daily.     . niacin (SLO-NIACIN) 500 MG tablet Take 2,000 mg by mouth at bedtime. 4 BY MOUTH AT BEDTIME    . doxycycline (VIBRA-TABS) 100 MG tablet Take 1 tablet (100 mg total) by mouth 2 (two) times daily. 20 tablet 0   No facility-administered medications prior to visit.   Past Medical History  Diagnosis Date  . PVD (peripheral vascular disease)   . Myocardial infarction     inferior wall  . Hyperlipidemia   . Hypertension   . Diabetes mellitus     controlled  . Prostate cancer     prostate  . Paroxysmal nocturnal dyspnea   . Cellulitis 10/18-20/2012    RLE   . Diverticulitis 02/19/13  . CAD (coronary artery disease)   . Gallstones    Past Surgical History  Procedure Laterality Date  . Prostate surgery    . Penile prosthesis  removal      swich(sphincter)...removed 04-2008, replaced 02-2009  . Cholecystectomy    . Tonsillectomy    . Cervical disc surgery  1988   History   Social History  . Marital Status: Married    Spouse Name: N/A  . Number of Children: 2  . Years of Education: N/A   Occupational History  . retired    Social History Main  Topics  . Smoking status: Former Smoker    Types: Cigarettes    Quit date: 05/17/1986  . Smokeless tobacco: Never Used  . Alcohol Use: No  . Drug Use: No  . Sexual Activity: Not on file   Other Topics Concern  . None   Social History Narrative   Married and retired. He is a English as a second language teacher. He gets some care from the [place]. 2 sons. 2 caffeinated drinks a day.   Family History  Problem Relation Age of Onset  . Seizures Father   . Gout Brother   . Kidney disease Brother     kidney failure   . Stroke Father   . Pneumonia Mother   . Diabetes Father   . Diabetes Brother   . Alcoholism Mother   . Heart disease Father         Review of Systems As per history of present illness and positive for hearing difficulty, chronic edema of the lower extremities, urinary incontinence, night sweats, decreased vision and  fatigue. All other review of systems are negative.    Objective:   Physical Exam @BP  106/56 mmHg  Pulse 72  Ht 5' 5.25" (1.657 m)  Wt 246 lb 8 oz (111.812 kg)  BMI 40.72 kg/m2@  General:  Well-developed, well-nourished and in no acute distress - obese Eyes:  anicteric. ENT:   Mouth and posterior pharynx free of lesions.  Neck:   supple w/o thyromegaly or mass.  Lungs: Clear to auscultation bilaterally. Heart:  S1S2, no rubs, murmurs, gallops. Abdomen:  soft, non-tender, no hepatosplenomegaly, hernia, or mass and BS+.  Rectal: Small posterior tag, absent anal wink decreased tone resting - slight, decreased squueze, + abd ctr but reduced descentSoft impaction Lymph:  no cervical or supraclavicular adenopathy. Extremities:   2+ non-pitting :E edema, cyanosis or clubbing Skin   no rash. Neuro:  A&O x 3.  Psych:  appropriate mood and  Affect.   Data Reviewed: CT scan report from 2014 showing diverticulosis with diverticulitis in the sigmoid colon. Renal cysts kidney stones postsurgical changes in the pelvis.  Urology visit note from 02/16/2015.     Assessment & Plan:   1. Fecal incontinence   2. Colon cancer screening   3. Fecal impaction   4. Pelvic floor dysfunction    I am not certain as to the cause of his problems. He seems to have a soft impaction at this time but that could be from the Imodium he took. He also appears to have some pelvic floor dysfunction A surprise given surgeries and radiation. Postcholecystectomy diarrhea affect could be implicated at least partially as well.  I did and abdominal film, he is not full of stool. However they are reporting sclerotic foci over the bony pelvis which may suggest metastatic disease. They say there are new from study of 04/23/2014.  I'm going to start colestipol 5 g with supper. I've called the x-ray results to him and these plans.  Is to call me back in about a week with an update on his symptoms.   He should have a  screening colonoscopy at some point. The chronicity of his fecal incontinence and rectal symptoms goes against any new problem here, that would require a diagnostic colonoscopy. A screening colonoscopy will probably make sense but now that he has these sclerotic foci, he may need a repeat bone scan the last 20 had in 2015 did not show any significant abnormality.   I will send a copy to [employer].D.

## 2015-04-08 NOTE — Patient Instructions (Signed)
   Please go to our x-ray department in the basement before leaving today to have abdominal films done.   We will call you with results and plans.    I appreciate the opportunity to care for you. Silvano Rusk, M.D., [facility]

## 2015-04-09 ENCOUNTER — Encounter: Payer: Self-pay | Admitting: Internal Medicine

## 2015-04-09 MED ORDER — COLESTIPOL HCL 5 G PO GRAN: 5.0000 g | GRANULES | Freq: Every day | ORAL | Status: DC

## 2015-04-09 NOTE — Progress Notes (Signed)
Quick Note:  I called him and advised he see Dr. Diona Fanti again re? Bony mets Starting colestipol and he is to call back in 1 week with update ______

## 2015-04-22 ENCOUNTER — Other Ambulatory Visit: Payer: Self-pay | Admitting: Urology

## 2015-04-22 DIAGNOSIS — C61 Malignant neoplasm of prostate: Secondary | ICD-10-CM

## 2015-05-04 ENCOUNTER — Ambulatory Visit (HOSPITAL_COMMUNITY)
Admission: RE | Admit: 2015-05-04 | Discharge: 2015-05-04 | Disposition: A | Discharge status: Home / Self Care | Payer: Medicare Other | Source: Ambulatory Visit | Attending: Urology | Admitting: Urology

## 2015-05-04 ENCOUNTER — Encounter (HOSPITAL_COMMUNITY)
Admission: RE | Admit: 2015-05-04 | Discharge: 2015-05-04 | Disposition: A | Discharge status: Home / Self Care | Payer: Medicare Other | Source: Ambulatory Visit | Attending: Urology | Admitting: Urology

## 2015-05-04 DIAGNOSIS — M899 Disorder of bone, unspecified: Secondary | ICD-10-CM | POA: Insufficient documentation

## 2015-05-04 DIAGNOSIS — C61 Malignant neoplasm of prostate: Secondary | ICD-10-CM | POA: Diagnosis present

## 2015-05-04 MED ORDER — TECHNETIUM TC 99M MEDRONATE IV KIT
25.9000 | PACK | Freq: Once | INTRAVENOUS | Status: AC | PRN
  Administered 2015-05-04: 25.9 via INTRAVENOUS

## 2015-05-05 ENCOUNTER — Telehealth: Payer: Self-pay | Admitting: Internal Medicine

## 2015-05-06 NOTE — Telephone Encounter (Signed)
Have him try 1/2 scoop of the colestipol daily

## 2015-05-06 NOTE — Telephone Encounter (Signed)
Patient informed of dose adjustment and will call us back in a week with an update.

## 2015-05-06 NOTE — Telephone Encounter (Signed)
He said you mentioned when he was here that if he was "backed up" it could cause diarrhea.

## 2015-05-06 NOTE — Telephone Encounter (Signed)
Left message to call me back.

## 2015-05-06 NOTE — Telephone Encounter (Signed)
Left message for patient to call me back. 

## 2015-05-06 NOTE — Telephone Encounter (Signed)
Patient called back and he said he started the colestipol last week.  He took  It 4 days and it gave him 48 hour episode of diarrhea.  Please advise if you want him to continue the colestipol.  He reports no fever or abdominal pain.

## 2015-05-06 NOTE — Telephone Encounter (Signed)
I don't understand that - never seen it cause diarrhea - unless he got constipated and had a "blowout" phase. Did that happen?

## 2015-05-11 ENCOUNTER — Other Ambulatory Visit: Payer: Self-pay

## 2015-05-14 ENCOUNTER — Telehealth: Payer: Self-pay | Admitting: Internal Medicine

## 2015-05-14 NOTE — Telephone Encounter (Signed)
Patient called and reported the 1/2 dose of colestipol is working well.  No diarrhea or constipation. He is advised to continue on 1/2 dose and call us if he has any additional questions or concerns

## 2015-06-05 ENCOUNTER — Ambulatory Visit: Payer: Self-pay | Admitting: Podiatry

## 2015-06-25 ENCOUNTER — Telehealth: Payer: Self-pay | Admitting: Internal Medicine

## 2015-06-25 ENCOUNTER — Other Ambulatory Visit: Payer: Self-pay | Admitting: Internal Medicine

## 2015-06-25 DIAGNOSIS — I10 Essential (primary) hypertension: Secondary | ICD-10-CM

## 2015-06-25 DIAGNOSIS — E785 Hyperlipidemia, unspecified: Secondary | ICD-10-CM

## 2015-06-25 DIAGNOSIS — E1165 Type 2 diabetes mellitus with hyperglycemia: Secondary | ICD-10-CM

## 2015-06-25 NOTE — Telephone Encounter (Signed)
Spoke with pts wife to inform her that the labs have been ordered.

## 2015-06-25 NOTE — Telephone Encounter (Signed)
Please advise, pt hasnt had labs since 2014

## 2015-06-25 NOTE — Telephone Encounter (Signed)
done

## 2015-06-25 NOTE — Telephone Encounter (Signed)
Patient is having CPE on 07/10/2015. Has [employer] medicare. Asks that labs be placed before hand.

## 2015-07-08 ENCOUNTER — Other Ambulatory Visit (INDEPENDENT_AMBULATORY_CARE_PROVIDER_SITE_OTHER): Payer: Medicare Other

## 2015-07-08 DIAGNOSIS — I10 Essential (primary) hypertension: Secondary | ICD-10-CM

## 2015-07-08 DIAGNOSIS — E1165 Type 2 diabetes mellitus with hyperglycemia: Secondary | ICD-10-CM

## 2015-07-08 DIAGNOSIS — E785 Hyperlipidemia, unspecified: Secondary | ICD-10-CM

## 2015-07-08 LAB — HEPATIC FUNCTION PANEL
ALT: 27 U/L (ref 0–53)
AST: 24 U/L (ref 0–37)
Albumin: 3.6 g/dL (ref 3.5–5.2)
Alkaline Phosphatase: 80 U/L (ref 39–117)
Bilirubin, Direct: 0.1 mg/dL (ref 0.0–0.3)
Total Bilirubin: 0.4 mg/dL (ref 0.2–1.2)
Total Protein: 6.6 g/dL (ref 6.0–8.3)

## 2015-07-08 LAB — BASIC METABOLIC PANEL
BUN: 22 mg/dL (ref 6–23)
CO2: 30 mEq/L (ref 19–32)
Calcium: 9.2 mg/dL (ref 8.4–10.5)
Chloride: 103 mEq/L (ref 96–112)
Creatinine, Ser: 0.97 mg/dL (ref 0.40–1.50)
GFR: 81.09 mL/min (ref >60.00)
Glucose, Bld: 105 mg/dL — ABNORMAL HIGH (ref 70–99)
Potassium: 4.5 mEq/L (ref 3.5–5.1)
Sodium: 141 mEq/L (ref 135–145)

## 2015-07-08 LAB — HEMOGLOBIN A1C: Hgb A1c MFr Bld: 6.3 % (ref 4.6–6.5)

## 2015-07-08 LAB — TSH: TSH: 2.2 u[IU]/mL (ref 0.35–4.50)

## 2015-07-08 LAB — MICROALBUMIN / CREATININE URINE RATIO
Creatinine,U: 63.7 mg/dL
MICROALB/CREAT RATIO: 1.6 mg/g (ref 0.0–30.0)
Microalb, Ur: 1 mg/dL (ref 0.0–1.9)

## 2015-07-08 LAB — LIPID PANEL
Cholesterol: 112 mg/dL (ref 0–200)
HDL: 47.2 mg/dL (ref >39.00)
LDL CALC: 48 mg/dL (ref 0–99)
NonHDL: 64.52
TRIGLYCERIDES: 81 mg/dL (ref 0.0–149.0)
Total CHOL/HDL Ratio: 2
VLDL: 16.2 mg/dL (ref 0.0–40.0)

## 2015-07-10 ENCOUNTER — Ambulatory Visit (INDEPENDENT_AMBULATORY_CARE_PROVIDER_SITE_OTHER): Payer: Medicare Other | Admitting: Internal Medicine

## 2015-07-10 ENCOUNTER — Encounter: Payer: Self-pay | Admitting: Internal Medicine

## 2015-07-10 VITALS — BP 124/70 | HR 74 | Temp 98.1°F | Resp 16 | Ht 65.0 in | Wt 247.0 lb

## 2015-07-10 DIAGNOSIS — I10 Essential (primary) hypertension: Secondary | ICD-10-CM

## 2015-07-10 DIAGNOSIS — E785 Hyperlipidemia, unspecified: Secondary | ICD-10-CM

## 2015-07-10 DIAGNOSIS — E1165 Type 2 diabetes mellitus with hyperglycemia: Secondary | ICD-10-CM | POA: Diagnosis not present

## 2015-07-10 NOTE — Progress Notes (Signed)
Pre visit review using our clinic review tool, if applicable. No additional management support is needed unless otherwise documented below in the visit note. 

## 2015-07-10 NOTE — Progress Notes (Signed)
   Subjective:    Patient ID: Theodore [name], male    DOB: Jun 23, 1946, 69 y.o.   MRN: [id number]  HPI The patient is here to assess status of active health conditions.  PMH, FH, & Social History reviewed & updated.No change in [place] as recorded.  He is not on a heart healthy diet. He does add some salt at the table. He is not monitoring his blood pressure. He states he's been compliant with his medicines without adverse effects  Fasting blood sugars range 112-126. He denies any hypoglycemic spells. Ophthalmologic exam is due in October. He is seeing by the Podiatrist every 3 months with filament testing performed at each visit. He has absent sensation over the ventral feet.  He has had chronic diarrhea for which he has seen  Dr Alphonsa Overall..  He also notes easy bruising on the 325 mg aspirin.  He does have occasional postural lightheadedness.  He had a mechanical fall recently tripping on the stairs. There was no cardiac or neurologic prodrome prior to the fall. He has had some residual hip and back pain on the right. He ambulates with a cane.  He recently had a shot of Lupron because of lesions seen on bone scan in the rib and hip. Lupron is given based on PSA levels.   Review of Systems  Chest pain, palpitations, tachycardia, paroxysmal nocturnal dyspnea,or claudication are absent. No unexplained weight loss, abdominal pain, significant dyspepsia, dysphagia, melena, rectal bleeding, or persistently small caliber stools. Dysuria, pyuria, or hematuria are denied. Change in hair, skin, nails denied.  No bowel changes of constipation . No intolerance to heat or cold. Epistaxis, hemoptysis, abnormal bleeding, or difficulty stopping bleeding with injury.    Objective:   Physical Exam  Pertinent or positive findings include:  He has upper plate and lower partial. Faint bilateral carotid bruits are present. There is a distant S4. Central obesity is present with a massive  abdomen. Breath sounds are decreased. He has tense edema of the lower extremities with 1+ pitting. Reflexes are 0+. He ambulates with a cane. He has scattered bruising over the forearms.he has decreased hearing.  General appearance :adequately nourished; in no distress. BMI 41.1  Eyes: No conjunctival inflammation or scleral icterus is present.  Oral exam:  Lips and gums are healthy appearing.There is no oropharyngeal erythema or exudate noted.   Heart:  Normal rate and regular rhythm. S1 and S2 normal without gallop, murmur, click, or rub.    Lungs:Chest clear to auscultation; no wheezes, rhonchi,rales ,or rubs present.No increased work of breathing.   Abdomen: bowel sounds normal, soft and non-tender without masses, organomegaly or hernias noted.  No guarding or rebound.   Vascular : all pulses equal.  Skin:Warm & dry.  Intact without suspicious lesions or rashes ; no tenting or jaundice   Lymphatic: No lymphadenopathy is noted about the head, neck, axilla.   Neuro: Strength, tone decreased.       Assessment & Plan:  #1diabetes with peripheral neuropathy; A1c is now in the prediabetic range. Metformin will be decreased to 500 mg at the largest meal. A1c will be checked in 6 months.  #2 His LDL is much lower than the ideal goal of less than 70. He is to discuss adjusting his statin with his Cardiologist when seen in December.  #3 HTN , controlled. No change in meds.

## 2015-07-10 NOTE — Patient Instructions (Signed)
Please decrease the metformin to 500 mg once a day after the largest meal. Recheck the A1c in 6 months. You do not need to be monitoring glucoses at this time based on the A1c of 6.3%.

## 2015-08-06 ENCOUNTER — Encounter: Payer: Self-pay | Admitting: Podiatry

## 2015-08-06 ENCOUNTER — Ambulatory Visit (INDEPENDENT_AMBULATORY_CARE_PROVIDER_SITE_OTHER): Payer: Medicare Other | Admitting: Podiatry

## 2015-08-06 DIAGNOSIS — E1140 Type 2 diabetes mellitus with diabetic neuropathy, unspecified: Secondary | ICD-10-CM

## 2015-08-06 DIAGNOSIS — B351 Tinea unguium: Secondary | ICD-10-CM

## 2015-08-06 DIAGNOSIS — M79676 Pain in unspecified toe(s): Secondary | ICD-10-CM | POA: Diagnosis not present

## 2015-08-06 NOTE — Progress Notes (Signed)
Patient ID: Theodore [name], male   DOB: [birth year], 69 y.o.   MRN: [id number] Complaint:  Visit Type: Patient returns to my office for continued preventative foot care services. Complaint: Patient states" my nails have grown long and thick and become painful to walk and wear shoes" Patient has been diagnosed with DM with no foot complications. The patient presents for preventative foot care services. No changes to ROS  Podiatric Exam: Vascular: dorsalis pedis and posterior tibial pulses are not  palpable bilateral. Capillary return is immediate. Temperature gradient is WNL.   Sensorium: Normal Semmes Weinstein monofilament test. Normal tactile sensation bilaterally. Nail Exam: Pt has thick disfigured discolored nails with subungual debris noted bilateral entire nail hallux  Ulcer Exam: There is no evidence of ulcer or pre-ulcerative changes or infection. Orthopedic Exam: Muscle tone and strength are WNL. No limitations in general ROM. No crepitus or effusions noted. Foot type and digits show no abnormalities. Bony prominences are unremarkable. Skin: No Porokeratosis. No infection or ulcers  Diagnosis:  Onychomycosis, , Pain in right toe, pain in left toes  Treatment & Plan Procedures and Treatment: Consent by patient was obtained for treatment procedures. The patient understood the discussion of treatment and procedures well. All questions were answered thoroughly reviewed. Debridement of mycotic and hypertrophic toenails, 1 through 5 bilateral and clearing of subungual debris. No ulceration, no infection noted.  Return Visit-Office Procedure: Patient instructed to return to the office for a follow up visit 3 months for continued evaluation and treatment.

## 2015-09-03 ENCOUNTER — Other Ambulatory Visit: Payer: Self-pay | Admitting: Internal Medicine

## 2015-10-15 ENCOUNTER — Ambulatory Visit (INDEPENDENT_AMBULATORY_CARE_PROVIDER_SITE_OTHER): Payer: Medicare Other | Admitting: Podiatry

## 2015-10-15 ENCOUNTER — Encounter: Payer: Self-pay | Admitting: Podiatry

## 2015-10-15 DIAGNOSIS — M79676 Pain in unspecified toe(s): Secondary | ICD-10-CM

## 2015-10-15 DIAGNOSIS — E1140 Type 2 diabetes mellitus with diabetic neuropathy, unspecified: Secondary | ICD-10-CM

## 2015-10-15 DIAGNOSIS — B351 Tinea unguium: Secondary | ICD-10-CM

## 2015-10-15 NOTE — Progress Notes (Signed)
Patient ID: [name], male   DOB: 11/29/1945, 69 y.o.   MRN: [id number] Complaint:  Visit Type: Patient returns to my office for continued preventative foot care services. Complaint: Patient states" my nails have grown long and thick and become painful to walk and wear shoes" Patient has been diagnosed with DM with no foot complications. The patient presents for preventative foot care services. No changes to ROS  Podiatric Exam: Vascular: dorsalis pedis and posterior tibial pulses are not  palpable bilateral. Capillary return is immediate. Temperature gradient is WNL.   Sensorium: Normal Semmes Weinstein monofilament test. Normal tactile sensation bilaterally. Nail Exam: Pt has thick disfigured discolored nails with subungual debris noted bilateral entire nail hallux  Ulcer Exam: There is no evidence of ulcer or pre-ulcerative changes or infection. Orthopedic Exam: Muscle tone and strength are WNL. No limitations in general ROM. No crepitus or effusions noted. Foot type and digits show no abnormalities. Bony prominences are unremarkable. Skin: No Porokeratosis. No infection or ulcers  Diagnosis:  Onychomycosis, , Pain in right toe, pain in left toes  Treatment & Plan Procedures and Treatment: Consent by patient was obtained for treatment procedures. The patient understood the discussion of treatment and procedures well. All questions were answered thoroughly reviewed. Debridement of mycotic and hypertrophic toenails, 1 through 5 bilateral and clearing of subungual debris. No ulceration, no infection noted.  Return Visit-Office Procedure: Patient instructed to return to the office for a follow up visit 3 months for continued evaluation and treatment. 

## 2015-11-04 ENCOUNTER — Telehealth: Payer: Self-pay

## 2015-11-04 NOTE — Telephone Encounter (Signed)
Patient refused flu vaccine and is aware to get established with pcp (hopper leaving)

## 2015-12-03 ENCOUNTER — Other Ambulatory Visit: Payer: Self-pay | Admitting: Internal Medicine

## 2015-12-24 ENCOUNTER — Encounter: Payer: Self-pay | Admitting: Podiatry

## 2015-12-24 ENCOUNTER — Ambulatory Visit (INDEPENDENT_AMBULATORY_CARE_PROVIDER_SITE_OTHER): Payer: Medicare Other | Admitting: Podiatry

## 2015-12-24 DIAGNOSIS — E1140 Type 2 diabetes mellitus with diabetic neuropathy, unspecified: Secondary | ICD-10-CM

## 2015-12-24 DIAGNOSIS — M79676 Pain in unspecified toe(s): Secondary | ICD-10-CM

## 2015-12-24 DIAGNOSIS — B351 Tinea unguium: Secondary | ICD-10-CM

## 2015-12-24 NOTE — Progress Notes (Signed)
Patient ID: Theodore [name], male   DOB: [birth year], 70 y.o.   MRN: [id number] Complaint:  Visit Type: Patient returns to my office for continued preventative foot care services. Complaint: Patient states" my nails have grown long and thick and become painful to walk and wear shoes" Patient has been diagnosed with DM with no foot complications. The patient presents for preventative foot care services. No changes to ROS  Podiatric Exam: Vascular: dorsalis pedis and posterior tibial pulses are not  palpable bilateral. Capillary return is immediate. Temperature gradient is WNL.   Sensorium: Normal Semmes Weinstein monofilament test. Normal tactile sensation bilaterally. Nail Exam: Pt has thick disfigured discolored nails with subungual debris noted bilateral entire nail hallux  Ulcer Exam: There is no evidence of ulcer or pre-ulcerative changes or infection. Orthopedic Exam: Muscle tone and strength are WNL. No limitations in general ROM. No crepitus or effusions noted. Foot type and digits show no abnormalities. Bony prominences are unremarkable. Skin: No Porokeratosis. No infection or ulcers  Diagnosis:  Onychomycosis, , Pain in right toe, pain in left toes  Treatment & Plan Procedures and Treatment: Consent by patient was obtained for treatment procedures. The patient understood the discussion of treatment and procedures well. All questions were answered thoroughly reviewed. Debridement of mycotic and hypertrophic toenails, 1 through 5 bilateral and clearing of subungual debris. No ulceration, no infection noted.  Return Visit-Office Procedure: Patient instructed to return to the office for a follow up visit 3 months for continued evaluation and treatment.   Gardiner Barefoot DPM

## 2016-03-22 ENCOUNTER — Telehealth: Payer: Self-pay | Admitting: Internal Medicine

## 2016-03-22 MED ORDER — COLESTIPOL HCL 5 G PO GRAN: GRANULES | ORAL | Status: DC

## 2016-03-22 NOTE — Telephone Encounter (Signed)
Refill x 1 year 

## 2016-03-22 NOTE — Telephone Encounter (Signed)
Ok to refill Sir, thank you for your time.

## 2016-03-24 ENCOUNTER — Ambulatory Visit (INDEPENDENT_AMBULATORY_CARE_PROVIDER_SITE_OTHER): Payer: Medicare Other | Admitting: Podiatry

## 2016-03-24 ENCOUNTER — Encounter: Payer: Self-pay | Admitting: Podiatry

## 2016-03-24 ENCOUNTER — Telehealth: Payer: Self-pay | Admitting: Internal Medicine

## 2016-03-24 DIAGNOSIS — B351 Tinea unguium: Secondary | ICD-10-CM

## 2016-03-24 DIAGNOSIS — M79676 Pain in unspecified toe(s): Secondary | ICD-10-CM

## 2016-03-24 DIAGNOSIS — E1140 Type 2 diabetes mellitus with diabetic neuropathy, unspecified: Secondary | ICD-10-CM | POA: Diagnosis not present

## 2016-03-24 NOTE — Telephone Encounter (Signed)
Pt needs all scripts sent to [facility] not [facility]

## 2016-03-24 NOTE — Progress Notes (Signed)
Patient ID: Theodore [name], male   DOB: [birth year], 70 y.o.   MRN: [id number] Complaint:  Visit Type: Patient returns to my office for continued preventative foot care services. Complaint: Patient states" my nails have grown long and thick and become painful to walk and wear shoes" Patient has been diagnosed with DM with no foot complications. The patient presents for preventative foot care services. No changes to ROS  Podiatric Exam: Vascular: dorsalis pedis and posterior tibial pulses are not  palpable bilateral. Capillary return is immediate. Temperature gradient is WNL.   Sensorium: Normal Semmes Weinstein monofilament test. Normal tactile sensation bilaterally. Nail Exam: Pt has thick disfigured discolored nails with subungual debris noted bilateral entire nail hallux  Ulcer Exam: There is no evidence of ulcer or pre-ulcerative changes or infection. Orthopedic Exam: Muscle tone and strength are WNL. No limitations in general ROM. No crepitus or effusions noted. Foot type and digits show no abnormalities. Bony prominences are unremarkable. Skin: No Porokeratosis. No infection or ulcers  Diagnosis:  Onychomycosis, , Pain in right toe, pain in left toes  Treatment & Plan Procedures and Treatment: Consent by patient was obtained for treatment procedures. The patient understood the discussion of treatment and procedures well. All questions were answered thoroughly reviewed. Debridement of mycotic and hypertrophic toenails, 1 through 5 bilateral and clearing of subungual debris. No ulceration, no infection noted.  Return Visit-Office Procedure: Patient instructed to return to the office for a follow up visit 3 months for continued evaluation and treatment.   Gardiner Barefoot DPM

## 2016-03-24 NOTE — Telephone Encounter (Signed)
Changed drug store per patient request from [facility] to [facility].

## 2016-04-15 LAB — HEMOGLOBIN A1C: HEMOGLOBIN A1C: 6.5

## 2016-05-03 ENCOUNTER — Encounter: Payer: Self-pay | Admitting: Family

## 2016-05-03 ENCOUNTER — Ambulatory Visit (INDEPENDENT_AMBULATORY_CARE_PROVIDER_SITE_OTHER): Payer: Medicare Other | Admitting: Family

## 2016-05-03 VITALS — BP 120/64 | HR 69 | Temp 97.8°F | Resp 16 | Ht 65.0 in | Wt 256.0 lb

## 2016-05-03 DIAGNOSIS — L03119 Cellulitis of unspecified part of limb: Secondary | ICD-10-CM

## 2016-05-03 MED ORDER — TRAMADOL HCL 50 MG PO TABS: 50.0000 mg | ORAL_TABLET | Freq: Three times a day (TID) | ORAL | Status: DC | PRN

## 2016-05-03 MED ORDER — DOXYCYCLINE HYCLATE 100 MG PO TABS: 100.0000 mg | ORAL_TABLET | Freq: Two times a day (BID) | ORAL | Status: DC

## 2016-05-03 NOTE — Progress Notes (Signed)
Pre visit review using our clinic review tool, if applicable. No additional management support is needed unless otherwise documented below in the visit note. 

## 2016-05-03 NOTE — Progress Notes (Signed)
Subjective:    Patient ID: Theodore [name], male    DOB: Apr 30, 1946, 70 y.o.   MRN: [id number]  Chief Complaint  Patient presents with  . Leg Swelling    bilateral leg swelling, has abscesses on both lower legs, legs are leaking fluid    HPI:  Theodore [name] is a 70 y.o. male who  has a past medical history of PVD (peripheral vascular disease) ([place]); Myocardial infarction ([facility]); Hyperlipidemia; Hypertension; Diabetes mellitus; Prostate cancer ([place]); Paroxysmal nocturnal dyspnea; Cellulitis (10/18-20/2012); Diverticulitis (02/19/13); CAD (coronary artery disease); and Gallstones. and presents today for an acute office visit.  This is a new problem. Associated symptom of swelling and abscesses located in his bilateral lower legs has been going on for about 3-4 months. Denies fever. Modifying factors include daily maintenance with furosemide which has not helped very much. Describes that there is clear fluid discharge. Other modifying factors include cleansing with soap and water. Does have the inability to lie flat with sleeping in a reclined position and does not generally elevate his legs because he has difficulty breathing. Does have a fair amount of salt intake.   Allergies  Allergen Reactions  . Sulfonamide Derivatives     "reaction" as infant according to his mother     Current Outpatient Prescriptions on File Prior to Visit  Medication Sig Dispense Refill  . aspirin 325 MG tablet Take 325 mg by mouth every evening.     Marland Kitchen atorvastatin (LIPITOR) 20 MG tablet Take 10 mg by mouth at bedtime.     . clotrimazole-betamethasone (LOTRISONE) cream Apply 1 application topically 2 (two) times daily.    . colestipol (COLESTID) 5 g granules TAKE 5 GRAMS BY MOUTH DAILY WITH SUPPER. 500 g 3  . doxazosin (CARDURA) 8 MG tablet Take 4 mg by mouth daily. 1/2 by mouth daily    . furosemide (LASIX) 20 MG tablet Take 1 tablet (20 mg total) by mouth 2 (two) times daily. 180 tablet 3  . Leuprolide  Acetate (LUPRON IJ) Inject as directed. 1 injection every 4 months     . losartan (COZAAR) 100 MG tablet Take 50 mg by mouth daily. 1/2 BY MOUTH DAILY    . metFORMIN (GLUCOPHAGE) 500 MG tablet Take 500 mg by mouth daily.     . metoprolol (LOPRESSOR) 100 MG tablet Take 100 mg by mouth 2 (two) times daily.    . Multiple Vitamins-Minerals (CENTRUM SILVER PO) Take 1 tablet by mouth daily.     . niacin (SLO-NIACIN) 500 MG tablet Take 2,000 mg by mouth at bedtime. 4 BY MOUTH AT BEDTIME     No current facility-administered medications on file prior to visit.     Past Surgical History  Procedure Laterality Date  . Prostate surgery    . Penile prosthesis  removal      swich(sphincter)...removed 04-2008, replaced 02-2009  . Cholecystectomy    . Tonsillectomy    . Cervical disc surgery  1988    Past Medical History  Diagnosis Date  . PVD (peripheral vascular disease) ([place])   . 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 Hyperlipidemia   . Hypertension   . Diabetes mellitus     controlled  . Prostate cancer [facility])     prostate  . Paroxysmal nocturnal dyspnea   . Cellulitis 10/18-20/2012    RLE   . Diverticulitis 02/19/13  . CAD (coronary artery disease)   . Gallstones     Review of Systems  Constitutional: Negative for fever and chills.  Respiratory: Negative for chest tightness and shortness of breath.   Cardiovascular: Positive for leg swelling.      Objective:    BP 120/64 mmHg  Pulse 69  Temp(Src) 97.8 F (36.6 C) (Oral)  Resp 16  Ht 5\' 5"  (1.651 m)  Wt 256 lb (116.121 kg)  BMI 42.60 kg/m2  SpO2 91% Nursing note and vital signs reviewed.  Physical Exam  Constitutional: He is oriented to person, place, and time. He appears well-developed and well-nourished. No distress.  Cardiovascular: Normal rate, regular rhythm, normal heart sounds and intact distal pulses.   Bilateral lower extremity edema.  Pulmonary/Chest: Effort normal and breath sounds normal. No  respiratory distress.  Neurological: He is alert and oriented to person, place, and time.  Skin: Skin is warm and dry.  6 cm x 6 cm area of red/pink skin appearing with clear fluid and open to air with no purulent drainage located on the posterior aspect of his left calf and medial aspect of his right calf.   Psychiatric: He has a normal mood and affect. His behavior is normal. Judgment and thought content normal.       Assessment & Plan:   Problem List Items Addressed This Visit      Other   Cellulitis and abscess of leg - Primary    Symptoms and exam consistent with cellulitis and fluid overload related to his peripheral vascular disease. Sites were cleansed with soap and water and tolerated without problem. Start doxycycline to cover for infection. May need referral to wound care given diabetes status if sites do not heal. Will also consider diuresis if leg swelling continues. Start tramadol as needed for pain. Follow up in 1 week or sooner if symptoms worsen.          I am having Mr. Theodore [name] start on doxycycline and traMADol. I am also having him maintain his aspirin, Multiple Vitamins-Minerals (CENTRUM SILVER PO), Leuprolide Acetate (LUPRON IJ), furosemide, niacin, metoprolol, atorvastatin, losartan, doxazosin, metFORMIN, clotrimazole-betamethasone, and colestipol.   Meds ordered this encounter  Medications  . doxycycline (VIBRA-TABS) 100 MG tablet    Sig: Take 1 tablet (100 mg total) by mouth 2 (two) times daily.    Dispense:  20 tablet    Refill:  0    Order Specific Question:  Supervising Provider    Answer:  Pricilla Holm A [id number]  . traMADol (ULTRAM) 50 MG tablet    Sig: Take 1 tablet (50 mg total) by mouth every 8 (eight) hours as needed.    Dispense:  90 tablet    Refill:  0    Order Specific Question:  Supervising Provider    Answer:  Pricilla Holm A [id number]     Follow-up: Return in about 1 week (around 05/10/2016).  [name], FNP

## 2016-05-03 NOTE — Patient Instructions (Addendum)
Thank you for choosing [facility].  Summary/Instructions:  Your prescription(s) have been submitted to your pharmacy or been printed and provided for you. Please take as directed and contact our office if you believe you are having problem(s) with the medication(s) or have any questions.  If your symptoms worsen or fail to improve, please contact our office for further instruction, or in case of emergency go directly to the emergency room at the closest medical facility.   Please clean with soap and water.   Keep legs elevated  Decrease sodium in your diet.  Continue to take your medications as prescribed.   Cover with rolled gauze and non-stick gauze as needed  Cellulitis Cellulitis is an infection of the skin and the tissue beneath it. The infected area is usually red and tender. Cellulitis occurs most often in the arms and lower legs.  CAUSES  Cellulitis is caused by bacteria that enter the skin through cracks or cuts in the skin. The most common types of bacteria that cause cellulitis are staphylococci and streptococci. SIGNS AND SYMPTOMS   Redness and warmth.  Swelling.  Tenderness or pain.  Fever. DIAGNOSIS  Your health care provider can usually determine what is wrong based on a physical exam. Blood tests may also be done. TREATMENT  Treatment usually involves taking an antibiotic medicine. HOME CARE INSTRUCTIONS   Take your antibiotic medicine as directed by your health care provider. Finish the antibiotic even if you start to feel better.  Keep the infected arm or leg elevated to reduce swelling.  Apply a warm cloth to the affected area up to 4 times per day to relieve pain.  Take medicines only as directed by your health care provider.  Keep all follow-up visits as directed by your health care provider. SEEK MEDICAL CARE IF:   You notice red streaks coming from the infected area.  Your red area gets larger or turns dark in color.  Your bone or  joint underneath the infected area becomes painful after the skin has healed.  Your infection returns in the same area or another area.  You notice a swollen bump in the infected area.  You develop new symptoms.  You have a fever. SEEK IMMEDIATE MEDICAL CARE IF:   You feel very sleepy.  You develop vomiting or diarrhea.  You have a general ill feeling (malaise) with muscle aches and pains.   This information is not intended to replace advice given to you by your health care provider. Make sure you discuss any questions you have with your health care provider.   Document Released: 08/10/2005 Document Revised: 07/22/2015 Document Reviewed: 01/16/2012 Elsevier Interactive Patient Education [employer].

## 2016-05-03 NOTE — Assessment & Plan Note (Addendum)
Symptoms and exam consistent with cellulitis and fluid overload related to his peripheral vascular disease. Sites were cleansed with soap and water and tolerated without problem. Start doxycycline to cover for infection. May need referral to wound care given diabetes status if sites do not heal. Will also consider diuresis if leg swelling continues. Start tramadol as needed for pain. Follow up in 1 week or sooner if symptoms worsen.

## 2016-05-04 ENCOUNTER — Encounter: Payer: Medicare Other | Admitting: Family

## 2016-05-06 LAB — BASIC METABOLIC PANEL
BUN: 24 mg/dL — AB (ref 4–21)
CREATININE: 1 mg/dL (ref <=1.3)
Glucose: 115 mg/dL
POTASSIUM: 4.5 mmol/L (ref 3.4–5.3)
Sodium: 143 mmol/L (ref 137–147)

## 2016-05-06 LAB — LIPID PANEL
CHOLESTEROL: 130 mg/dL (ref 0–200)
HDL: 62 mg/dL (ref 35–70)
LDL Cholesterol: 46 mg/dL
TRIGLYCERIDES: 111 mg/dL (ref 40–160)

## 2016-05-06 LAB — HEPATIC FUNCTION PANEL
ALT: 32 U/L (ref 10–40)
AST: 17 U/L (ref 14–40)

## 2016-05-11 ENCOUNTER — Encounter: Payer: Medicare Other | Admitting: Family

## 2016-05-13 ENCOUNTER — Ambulatory Visit (INDEPENDENT_AMBULATORY_CARE_PROVIDER_SITE_OTHER): Payer: Medicare Other | Admitting: Family

## 2016-05-13 ENCOUNTER — Encounter: Payer: Self-pay | Admitting: Family

## 2016-05-13 VITALS — BP 124/78 | HR 68 | Temp 97.8°F | Resp 16 | Ht 65.0 in | Wt 258.0 lb

## 2016-05-13 DIAGNOSIS — I2510 Atherosclerotic heart disease of native coronary artery without angina pectoris: Secondary | ICD-10-CM

## 2016-05-13 DIAGNOSIS — Z0000 Encounter for general adult medical examination without abnormal findings: Secondary | ICD-10-CM | POA: Insufficient documentation

## 2016-05-13 NOTE — Assessment & Plan Note (Deleted)
Symptoms and exam consistent with sinusitis most likely viral related to allergies. Treat conservatively with over-the-counter medications as needed for symptom relief and supportive care. In office injection of Depo-Medrol provided. Discussed risks and proper usage of medication. Follow-up if symptoms worsen or do not improve.

## 2016-05-13 NOTE — Patient Instructions (Addendum)
Thank you for choosing [facility].  Summary/Instructions:   Health Maintenance, Male A healthy lifestyle and preventative care can promote health and wellness.  Maintain regular health, dental, and eye exams.  Eat a healthy diet. Foods like vegetables, fruits, whole grains, low-fat dairy products, and lean protein foods contain the nutrients you need and are low in calories. Decrease your intake of foods high in solid fats, added sugars, and salt. Get information about a proper diet from your health care provider, if necessary.  Regular physical exercise is one of the most important things you can do for your health. Most adults should get at least 150 minutes of moderate-intensity exercise (any activity that increases your heart rate and causes you to sweat) each week. In addition, most adults need muscle-strengthening exercises on 2 or more days a week.   Maintain a healthy weight. The body mass index (BMI) is a screening tool to identify possible weight problems. It provides an estimate of body fat based on height and weight. Your health care provider can find your BMI and can help you achieve or maintain a healthy weight. For males 20 years and older:  A BMI below 18.5 is considered underweight.  A BMI of 18.5 to 24.9 is normal.  A BMI of 25 to 29.9 is considered overweight.  A BMI of 30 and above is considered obese.  Maintain normal blood lipids and cholesterol by exercising and minimizing your intake of saturated fat. Eat a balanced diet with plenty of fruits and vegetables. Blood tests for lipids and cholesterol should begin at age 82 and be repeated every 5 years. If your lipid or cholesterol levels are high, you are over age 17, or you are at high risk for heart disease, you may need your cholesterol levels checked more frequently.Ongoing high lipid and cholesterol levels should be treated with medicines if diet and exercise are not working.  If you smoke, find out from  your health care provider how to quit. If you do not use tobacco, do not start.  Lung cancer screening is recommended for adults aged 39-80 years who are at high risk for developing lung cancer because of a history of smoking. A yearly low-dose CT scan of the lungs is recommended for people who have at least a 30-pack-year history of smoking and are current smokers or have quit within the past 15 years. A pack year of smoking is smoking an average of 1 pack of cigarettes a day for 1 year (for example, a 30-pack-year history of smoking could mean smoking 1 pack a day for 30 years or 2 packs a day for 15 years). Yearly screening should continue until the smoker has stopped smoking for at least 15 years. Yearly screening should be stopped for people who develop a health problem that would prevent them from having lung cancer treatment.  If you choose to drink alcohol, do not have more than 2 drinks per day. One drink is considered to be 12 oz (360 mL) of beer, 5 oz (150 mL) of wine, or 1.5 oz (45 mL) of liquor.  Avoid the use of street drugs. Do not share needles with anyone. Ask for help if you need support or instructions about stopping the use of drugs.  High blood pressure causes heart disease and increases the risk of stroke. High blood pressure is more likely to develop in:  People who have blood pressure in the end of the normal range (100-139/85-89 mm Hg).  People who are  overweight or obese.  People who are African American.  If you are 16-37 years of age, have your blood pressure checked every 3-5 years. If you are 47 years of age or older, have your blood pressure checked every year. You should have your blood pressure measured twice--once when you are at a hospital or clinic, and once when you are not at a hospital or clinic. Record the average of the two measurements. To check your blood pressure when you are not at a hospital or clinic, you can use:  An automated blood pressure machine  at a pharmacy.  A home blood pressure monitor.  If you are 13-14 years old, ask your health care provider if you should take aspirin to prevent heart disease.  Diabetes screening involves taking a blood sample to check your fasting blood sugar level. This should be done once every 3 years after age 63 if you are at a normal weight and without risk factors for diabetes. Testing should be considered at a younger age or be carried out more frequently if you are overweight and have at least 1 risk factor for diabetes.  Colorectal cancer can be detected and often prevented. Most routine colorectal cancer screening begins at the age of 83 and continues through age 61. However, your health care provider may recommend screening at an earlier age if you have risk factors for colon cancer. On a yearly basis, your health care provider may provide home test kits to check for hidden blood in the stool. A small camera at the end of a tube may be used to directly examine the colon (sigmoidoscopy or colonoscopy) to detect the earliest forms of colorectal cancer. Talk to your health care provider about this at age 29 when routine screening begins. A direct exam of the colon should be repeated every 5-10 years through age 43, unless early forms of precancerous polyps or small growths are found.  People who are at an increased risk for hepatitis B should be screened for this virus. You are considered at high risk for hepatitis B if:  You were born in a country where hepatitis B occurs often. Talk with your health care provider about which countries are considered high risk.  Your parents were born in a high-risk country and you have not received a shot to protect against hepatitis B (hepatitis B vaccine).  You have HIV or AIDS.  You use needles to inject street drugs.  You live with, or have sex with, someone who has hepatitis B.  You are a man who has sex with other men (MSM).  You get hemodialysis  treatment.  You take certain medicines for conditions like cancer, organ transplantation, and autoimmune conditions.  Hepatitis C blood testing is recommended for all people born from [birth year] through 1967 and any individual with known risk factors for hepatitis C.  Healthy men should no longer receive prostate-specific antigen (PSA) blood tests as part of routine cancer screening. Talk to your health care provider about prostate cancer screening.  Testicular cancer screening is not recommended for adolescents or adult males who have no symptoms. Screening includes self-exam, a health care provider exam, and other screening tests. Consult with your health care provider about any symptoms you have or any concerns you have about testicular cancer.  Practice safe sex. Use condoms and avoid high-risk sexual practices to reduce the spread of sexually transmitted infections (STIs).  You should be screened for STIs, including gonorrhea and chlamydia if:  You are  sexually active and are younger than 24 years.  You are older than 24 years, and your health care provider tells you that you are at risk for this type of infection.  Your sexual activity has changed since you were last screened, and you are at an increased risk for chlamydia or gonorrhea. Ask your health care provider if you are at risk.  If you are at risk of being infected with HIV, it is recommended that you take a prescription medicine daily to prevent HIV infection. This is called pre-exposure prophylaxis (PrEP). You are considered at risk if:  You are a man who has sex with other men (MSM).  You are a heterosexual man who is sexually active with multiple partners.  You take drugs by injection.  You are sexually active with a partner who has HIV.  Talk with your health care provider about whether you are at high risk of being infected with HIV. If you choose to begin PrEP, you should first be tested for HIV. You should then be tested  every 3 months for as long as you are taking PrEP.  Use sunscreen. Apply sunscreen liberally and repeatedly throughout the day. You should seek shade when your shadow is shorter than you. Protect yourself by wearing long sleeves, pants, a wide-brimmed hat, and sunglasses year round whenever you are outdoors.  Tell your health care provider of new moles or changes in moles, especially if there is a change in shape or color. Also, tell your health care provider if a mole is larger than the size of a pencil eraser.  A one-time screening for abdominal aortic aneurysm (AAA) and surgical repair of large AAAs by ultrasound is recommended for men aged 75-75 years who are current or former smokers.  Stay current with your vaccines (immunizations).   This information is not intended to replace advice given to you by your health care provider. Make sure you discuss any questions you have with your health care provider.   Document Released: 04/28/2008 Document Revised: 11/21/2014 Document Reviewed: 03/28/2011 Elsevier Interactive Patient Education 2016 [place] Maintenance  Topic Date Due  . Hepatitis C Screening  [birth year]  . FOOT EXAM  06/28/1954  . COLONOSCOPY  06/28/1994  . ZOSTAVAX  06/28/2004  . PNA vac Low Risk Adult (1 of 2 - PCV13) 06/28/2009  . OPHTHALMOLOGY EXAM  03/14/2014  . HEMOGLOBIN A1C  01/08/2016  . INFLUENZA VACCINE  06/14/2016  . TETANUS/TDAP  06/21/2022

## 2016-05-13 NOTE — Progress Notes (Signed)
Subjective:    Patient ID: Theodore [name], male    DOB: 11/28/1945, 70 y.o.   MRN: [id number]  Chief Complaint  Patient presents with  . Establish Care    CPE, had labs done last week    HPI:  Theodore [name] is a 70 y.o. male who presents today for an annual wellness visit.   1) Health Maintenance -   Diet - Averages about 2-3 meals per day consisting of fruits, vegetables, chicken, beef and fish; 3-4 cups of caffeine cups per day  Exercise - Limited exercise   2) Preventative Exams / Immunizations:  Dental -- Due for exam  Vision -- Scheduled for August   Health Maintenance  Topic Date Due  . Hepatitis C Screening  1946/11/08  . FOOT EXAM  06/28/1954  . COLONOSCOPY  06/28/1994  . ZOSTAVAX  06/28/2004  . PNA vac Low Risk Adult (1 of 2 - PCV13) 06/28/2009  . OPHTHALMOLOGY EXAM  03/14/2014  . HEMOGLOBIN A1C  01/08/2016  . INFLUENZA VACCINE  06/14/2016  . TETANUS/TDAP  06/21/2022     Immunization History  Administered Date(s) Administered  . Tdap 06/21/2012    RISK FACTORS  Tobacco History  Smoking status  . Former Smoker  . Types: Cigarettes  . Quit date: 05/17/1986  Smokeless tobacco  . Never Used     Cardiac risk factors: advanced age (older than 26 for men, 23 for women), diabetes mellitus, hypertension, male gender and obesity (BMI >= 30 kg/m2).  Depression Screen  Q1: Over the past two weeks, have you felt down, depressed or hopeless? No  Q2: Over the past two weeks, have you felt little interest or pleasure in doing things? No  Have you lost interest or pleasure in daily life? No  Do you often feel hopeless? No  Do you cry easily over simple problems? No  Activities of Daily Living In your present state of health, do you have any difficulty performing the following activities?:  Driving? No Managing money?  No Feeding yourself? No Getting from bed to chair? No Climbing a flight of stairs? No Preparing food and eating?: No Bathing or  showering? No Getting dressed: No Getting to the toilet? No Using the toilet: No Moving around from place to place: No In the past year have you fallen or had a near fall?:No   Home Safety Has smoke detector and wears seat belts. No firearms. No excess sun exposure. Are there smokers in your home (other than you)?  No Do you feel safe at home?  Yes  Hearing Difficulties: No Do you often ask people to speak up or repeat themselves? No Do you experience ringing or noises in your ears? No  Do you have difficulty understanding soft or whispered voices? No    Cognitive Testing  Alert? Yes   Normal Appearance? Yes  Oriented to person? Yes  Place? Yes   Time? Yes  Recall of three objects?  Yes  Can perform simple calculations? Yes  Displays appropriate judgment? Yes  Can read the correct time from a watch face? Yes  Do you feel that you have a problem with memory? No  Do you often misplace items? No   Advanced Directives have been discussed with the patient? Yes  Current Physicians/Providers and Suppliers  1. Terri Piedra, FNP - Internal medicine  2. Dr. Eulogio Ditch - Urology 3. Gardiner Barefoot, DPM - Podiatry 4. Jenkins Rouge, MD - Cardiology 5. Silvano Rusk, MD - Gastroenterology  Indicate  any recent Medical Services you may have received from other than [facility] providers in the past year (date may be approximate).  All answers were reviewed with the patient and necessary referrals were made:  [name], FNP   05/13/2016    Allergies  Allergen Reactions  . Sulfonamide Derivatives     "reaction" as infant according to his mother     Outpatient Prescriptions Prior to Visit  Medication Sig Dispense Refill  . aspirin 325 MG tablet Take 325 mg by mouth every evening.     Marland Kitchen atorvastatin (LIPITOR) 20 MG tablet Take 10 mg by mouth at bedtime.     . clotrimazole-betamethasone (LOTRISONE) cream Apply 1 application topically 2 (two) times daily.    . colestipol (COLESTID) 5 g  granules TAKE 5 GRAMS BY MOUTH DAILY WITH SUPPER. 500 g 3  . doxazosin (CARDURA) 8 MG tablet Take 4 mg by mouth daily. 1/2 by mouth daily    . doxycycline (VIBRA-TABS) 100 MG tablet Take 1 tablet (100 mg total) by mouth 2 (two) times daily. 20 tablet 0  . furosemide (LASIX) 20 MG tablet Take 1 tablet (20 mg total) by mouth 2 (two) times daily. 180 tablet 3  . Leuprolide Acetate (LUPRON IJ) Inject as directed. 1 injection every 4 months     . losartan (COZAAR) 100 MG tablet Take 50 mg by mouth daily. 1/2 BY MOUTH DAILY    . metFORMIN (GLUCOPHAGE) 500 MG tablet Take 500 mg by mouth daily.     . metoprolol (LOPRESSOR) 100 MG tablet Take 100 mg by mouth 2 (two) times daily.    . Multiple Vitamins-Minerals (CENTRUM SILVER PO) Take 1 tablet by mouth daily.     . niacin (SLO-NIACIN) 500 MG tablet Take 2,000 mg by mouth at bedtime. 4 BY MOUTH AT BEDTIME    . traMADol (ULTRAM) 50 MG tablet Take 1 tablet (50 mg total) by mouth every 8 (eight) hours as needed. 90 tablet 0   No facility-administered medications prior to visit.     Past Medical History  Diagnosis Date  . PVD (peripheral vascular disease) ([place])   . 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 Hyperlipidemia   . Hypertension   . Diabetes mellitus     controlled  . Prostate cancer [facility])     prostate  . Paroxysmal nocturnal dyspnea   . Cellulitis 10/18-20/2012    RLE   . Diverticulitis 02/19/13  . CAD (coronary artery disease)   . Gallstones      Past Surgical History  Procedure Laterality Date  . Prostate surgery    . Penile prosthesis  removal      swich(sphincter)...removed 04-2008, replaced 02-2009  . Cholecystectomy    . Tonsillectomy    . Cervical disc surgery  1988     Family History  Problem Relation Age of Onset  . Seizures Father   . Gout Brother   . Kidney disease Brother     kidney failure   . Stroke Father   . Pneumonia Mother   . Diabetes Father   . Diabetes Brother   . Alcoholism Mother   .  Heart disease Father      Social History   Social History  . Marital Status: Married    Spouse Name: N/A  . Number of Children: 2  . Years of Education: N/A   Occupational History  . retired    Social History Main Topics  . Smoking status: Former Smoker  Types: Cigarettes    Quit date: 05/17/1986  . Smokeless tobacco: Never Used  . Alcohol Use: No  . Drug Use: No  . Sexual Activity: Not on file   Other Topics Concern  . Not on file   Social History Narrative   Married and retired. He is a English as a second language teacher. He gets some care from the [place]. 2 sons. 2 caffeinated drinks a day.     Review of Systems  Constitutional: Denies fever, chills, fatigue, or significant weight gain/loss. HENT: Head: Denies headache or neck pain Ears: Denies changes in hearing, ringing in ears, earache, drainage Nose: Denies discharge, stuffiness, itching, nosebleed, sinus pain Throat: Denies sore throat, hoarseness, dry mouth, sores, thrush Eyes: Denies loss/changes in vision, pain, redness, blurry/double vision, flashing lights Cardiovascular: Denies chest pain/discomfort, tightness, palpitations, shortness of breath with activity, difficulty lying down, swelling, sudden awakening with shortness of breath Respiratory: Denies shortness of breath, cough, sputum production, wheezing Gastrointestinal: Denies dysphasia, heartburn, change in appetite, nausea, change in bowel habits, rectal bleeding, constipation, diarrhea, yellow skin or eyes Genitourinary: Denies frequency, urgency, burning/pain, blood in urine, incontinence, change in urinary strength. Musculoskeletal: Denies muscle/joint pain, stiffness, back pain, redness or swelling of joints, trauma Skin: Denies rashes, lumps, itching, dryness, color changes, or hair/nail changes Neurological: Denies dizziness, fainting, seizures, weakness, numbness, tingling, tremor Psychiatric - Denies nervousness, stress, depression or memory loss Endocrine: Denies heat  or cold intolerance, sweating, frequent urination, excessive thirst, changes in appetite Hematologic: Denies ease of bruising or bleeding    Objective:     BP 124/78 mmHg  Pulse 68  Temp(Src) 97.8 F (36.6 C) (Oral)  Resp 16  Ht 5\' 5"  (1.651 m)  Wt 258 lb (117.028 kg)  BMI 42.93 kg/m2  SpO2 91% Nursing note and vital signs reviewed.  Physical Exam  Constitutional: He is oriented to person, place, and time. He appears well-developed and well-nourished.  HENT:  Head: Normocephalic.  Right Ear: Hearing, tympanic membrane, external ear and ear canal normal.  Left Ear: Hearing, tympanic membrane, external ear and ear canal normal.  Nose: Nose normal.  Mouth/Throat: Uvula is midline, oropharynx is clear and moist and mucous membranes are normal.  Eyes: Conjunctivae and EOM are normal. Pupils are equal, round, and reactive to light.  Neck: Neck supple. No JVD present. No tracheal deviation present. No thyromegaly present.  Cardiovascular: Normal rate, regular rhythm, normal heart sounds and intact distal pulses.   Pulmonary/Chest: Effort normal and breath sounds normal.  Abdominal: Soft. Bowel sounds are normal. He exhibits no distension and no mass. There is no tenderness. There is no rebound and no guarding.  Musculoskeletal: Normal range of motion. He exhibits no edema or tenderness.  Lymphadenopathy:    He has no cervical adenopathy.  Neurological: He is alert and oriented to person, place, and time. He has normal reflexes. No cranial nerve deficit. He exhibits normal muscle tone. Coordination normal.  Skin: Skin is warm and dry.  Psychiatric: He has a normal mood and affect. His behavior is normal. Judgment and thought content normal.       Assessment & Plan:   During the course of the visit the patient was educated and counseled about appropriate screening and preventive services including:    Pneumococcal vaccine   Influenza vaccine  Td vaccine  Prostate cancer  screening  Diabetes screening  Glaucoma screening  Nutrition counseling   Diet review for nutrition referral? Yes ____  Not Indicated _X___   Patient Instructions (the written plan) was  given to the patient.  Medicare Attestation I have personally reviewed: The patient's medical and social history Their use of alcohol, tobacco or illicit drugs Their current medications and supplements The patient's functional ability including ADLs,fall risks, home safety risks, cognitive, and hearing and visual impairment Diet and physical activities Evidence for depression or mood disorders  The patient's weight, height, BMI,  have been recorded in the chart.  I have made referrals, counseling, and provided education to the patient based on review of the above and I have provided the patient with a written personalized care plan for preventive services.     [name], [place]   05/13/2016    Problem List Items Addressed This Visit      Cardiovascular and Mediastinum   CAD (coronary artery disease)    Appears stable currently with no chest pain or angina. Several risk factors including diabetes, hypertension, and hyperlipidemia as well as obesity. Chronic conditions appear adequately managed through medication regimens. Obesity maintenance an issue. We'll continue to monitor.        Other   Medicare annual wellness visit, subsequent - Primary    Reviewed and updated patient's medical, surgical, family and social history. Medications and allergies were also reviewed. Basic screenings for depression, activities of daily living, hearing, cognition and safety were performed. Provider list was updated and health plan was provided to the patient.       Routine general medical examination at a health care facility    1) Anticipatory Guidance: Discussed importance of wearing a seatbelt while driving and not texting while driving; changing batteries in smoke detector at least once annually;  wearing suntan lotion when outside; eating a balanced and moderate diet; getting physical activity at least 30 minutes per day.  2) Immunizations / Screenings / Labs:  Declines pneumonia and Zostavax. All other immunizations are up-to-date per recommendations. Prostate cancer screening completed through urology. Currently working with gastroenterology to reduce diarrhea secondary to prostate cancer and will schedule colonoscopy. Due for a dental and vision exam encouraged to be scheduled independently. All other screenings are up-to-date per recommendations. Blood work completed through the [facility].  Overall well exam with risk factors for cardiovascular disease including hypertension, obesity, previous coronary artery disease, diabetes, and hyperlipidemia. Chronic conditions currently adequately managed through medication with A1c of 6.5 and lipid profile being normal. Maintained on atorvastatin and losartan for CAD risk reduction. Recommend weight loss of approximately 5-10% of current body weight through nutrition and physical activity.Recommend increasing physical activity to 30 minutes of moderate level activity daily. Encourage nutritional intake that focuses on nutrient dense foods and is moderate, varied, and balanced and is low in saturated fats and processed/sugary foods. Patient is resistant to exercise secondary to his back and leg pain. Alternative exercises discussed including aquatic therapy and upper extremity exercises. Continue other healthy lifestyle behaviors and choices. Follow-up prevention exam in 1 year. Follow-up office visit chronic conditions in 2 months.      Morbid obesity ([place])    Morbid obesity secondary to excess caloric intake and decreased physical activity. Current BMI of 42.93. Recommend weight loss of 5-10% of current body weight through nutrition and physical activity.Recommend increasing physical activity to 30 minutes of moderate level activity daily.  Encourage nutritional intake that focuses on nutrient dense foods and is moderate, varied, and balanced and is low in saturated fats and processed/sugary foods. As noted, patient is resistant to increasing physical activity secondary to legs and back pain. Alternative exercises discussed. Continue  to monitor.

## 2016-05-13 NOTE — Assessment & Plan Note (Signed)
Reviewed and updated patient's medical, surgical, family and social history. Medications and allergies were also reviewed. Basic screenings for depression, activities of daily living, hearing, cognition and safety were performed. Provider list was updated and health plan was provided to the patient.  

## 2016-05-13 NOTE — Assessment & Plan Note (Signed)
Appears stable currently with no chest pain or angina. Several risk factors including diabetes, hypertension, and hyperlipidemia as well as obesity. Chronic conditions appear adequately managed through medication regimens. Obesity maintenance an issue. We'll continue to monitor.

## 2016-05-13 NOTE — Progress Notes (Signed)
Pre visit review using our clinic review tool, if applicable. No additional management support is needed unless otherwise documented below in the visit note. 

## 2016-05-13 NOTE — Assessment & Plan Note (Signed)
1) Anticipatory Guidance: Discussed importance of wearing a seatbelt while driving and not texting while driving; changing batteries in smoke detector at least once annually; wearing suntan lotion when outside; eating a balanced and moderate diet; getting physical activity at least 30 minutes per day.  2) Immunizations / Screenings / Labs:  Declines pneumonia and Zostavax. All other immunizations are up-to-date per recommendations. Prostate cancer screening completed through urology. Currently working with gastroenterology to reduce diarrhea secondary to prostate cancer and will schedule colonoscopy. Due for a dental and vision exam encouraged to be scheduled independently. All other screenings are up-to-date per recommendations. Blood work completed through the [facility].  Overall well exam with risk factors for cardiovascular disease including hypertension, obesity, previous coronary artery disease, diabetes, and hyperlipidemia. Chronic conditions currently adequately managed through medication with A1c of 6.5 and lipid profile being normal. Maintained on atorvastatin and losartan for CAD risk reduction. Recommend weight loss of approximately 5-10% of current body weight through nutrition and physical activity.Recommend increasing physical activity to 30 minutes of moderate level activity daily. Encourage nutritional intake that focuses on nutrient dense foods and is moderate, varied, and balanced and is low in saturated fats and processed/sugary foods. Patient is resistant to exercise secondary to his back and leg pain. Alternative exercises discussed including aquatic therapy and upper extremity exercises. Continue other healthy lifestyle behaviors and choices. Follow-up prevention exam in 1 year. Follow-up office visit chronic conditions in 2 months.

## 2016-05-13 NOTE — Assessment & Plan Note (Signed)
Morbid obesity secondary to excess caloric intake and decreased physical activity. Current BMI of 42.93. Recommend weight loss of 5-10% of current body weight through nutrition and physical activity.Recommend increasing physical activity to 30 minutes of moderate level activity daily. Encourage nutritional intake that focuses on nutrient dense foods and is moderate, varied, and balanced and is low in saturated fats and processed/sugary foods. As noted, patient is resistant to increasing physical activity secondary to legs and back pain. Alternative exercises discussed. Continue to monitor.

## 2016-05-23 ENCOUNTER — Encounter: Payer: Self-pay | Admitting: Family

## 2016-05-24 ENCOUNTER — Encounter (HOSPITAL_COMMUNITY): Payer: Self-pay | Admitting: Emergency Medicine

## 2016-05-24 ENCOUNTER — Inpatient Hospital Stay (HOSPITAL_COMMUNITY)
Admission: EM | Admit: 2016-05-24 | Discharge: 2016-06-04 | DRG: 871 | Disposition: A | Discharge status: Home Health | Payer: Medicare Other | Attending: Internal Medicine | Admitting: Internal Medicine

## 2016-05-24 ENCOUNTER — Emergency Department (HOSPITAL_COMMUNITY): Payer: Medicare Other

## 2016-05-24 DIAGNOSIS — E1151 Type 2 diabetes mellitus with diabetic peripheral angiopathy without gangrene: Secondary | ICD-10-CM | POA: Diagnosis present

## 2016-05-24 DIAGNOSIS — I519 Heart disease, unspecified: Secondary | ICD-10-CM | POA: Diagnosis present

## 2016-05-24 DIAGNOSIS — R9389 Abnormal findings on diagnostic imaging of other specified body structures: Secondary | ICD-10-CM

## 2016-05-24 DIAGNOSIS — E876 Hypokalemia: Secondary | ICD-10-CM | POA: Diagnosis present

## 2016-05-24 DIAGNOSIS — Z8249 Family history of ischemic heart disease and other diseases of the circulatory system: Secondary | ICD-10-CM

## 2016-05-24 DIAGNOSIS — C7951 Secondary malignant neoplasm of bone: Secondary | ICD-10-CM | POA: Diagnosis present

## 2016-05-24 DIAGNOSIS — R410 Disorientation, unspecified: Secondary | ICD-10-CM

## 2016-05-24 DIAGNOSIS — E872 Acidosis: Secondary | ICD-10-CM | POA: Diagnosis present

## 2016-05-24 DIAGNOSIS — Z823 Family history of stroke: Secondary | ICD-10-CM

## 2016-05-24 DIAGNOSIS — L03119 Cellulitis of unspecified part of limb: Secondary | ICD-10-CM | POA: Diagnosis not present

## 2016-05-24 DIAGNOSIS — B950 Streptococcus, group A, as the cause of diseases classified elsewhere: Secondary | ICD-10-CM | POA: Diagnosis present

## 2016-05-24 DIAGNOSIS — R0600 Dyspnea, unspecified: Secondary | ICD-10-CM | POA: Diagnosis present

## 2016-05-24 DIAGNOSIS — I4510 Unspecified right bundle-branch block: Secondary | ICD-10-CM | POA: Diagnosis present

## 2016-05-24 DIAGNOSIS — A409 Streptococcal sepsis, unspecified: Secondary | ICD-10-CM | POA: Diagnosis present

## 2016-05-24 DIAGNOSIS — Z7984 Long term (current) use of oral hypoglycemic drugs: Secondary | ICD-10-CM

## 2016-05-24 DIAGNOSIS — J9601 Acute respiratory failure with hypoxia: Secondary | ICD-10-CM | POA: Diagnosis present

## 2016-05-24 DIAGNOSIS — G9341 Metabolic encephalopathy: Secondary | ICD-10-CM | POA: Diagnosis present

## 2016-05-24 DIAGNOSIS — I5189 Other ill-defined heart diseases: Secondary | ICD-10-CM | POA: Diagnosis present

## 2016-05-24 DIAGNOSIS — Z6841 Body Mass Index (BMI) 40.0 and over, adult: Secondary | ICD-10-CM | POA: Diagnosis not present

## 2016-05-24 DIAGNOSIS — E785 Hyperlipidemia, unspecified: Secondary | ICD-10-CM | POA: Diagnosis present

## 2016-05-24 DIAGNOSIS — I2510 Atherosclerotic heart disease of native coronary artery without angina pectoris: Secondary | ICD-10-CM | POA: Diagnosis present

## 2016-05-24 DIAGNOSIS — C61 Malignant neoplasm of prostate: Secondary | ICD-10-CM | POA: Diagnosis present

## 2016-05-24 DIAGNOSIS — I2582 Chronic total occlusion of coronary artery: Secondary | ICD-10-CM | POA: Diagnosis present

## 2016-05-24 DIAGNOSIS — R000 Tachycardia, unspecified: Secondary | ICD-10-CM | POA: Diagnosis present

## 2016-05-24 DIAGNOSIS — B951 Streptococcus, group B, as the cause of diseases classified elsewhere: Secondary | ICD-10-CM | POA: Diagnosis present

## 2016-05-24 DIAGNOSIS — D649 Anemia, unspecified: Secondary | ICD-10-CM | POA: Diagnosis present

## 2016-05-24 DIAGNOSIS — L03116 Cellulitis of left lower limb: Secondary | ICD-10-CM | POA: Diagnosis present

## 2016-05-24 DIAGNOSIS — I5031 Acute diastolic (congestive) heart failure: Secondary | ICD-10-CM | POA: Diagnosis present

## 2016-05-24 DIAGNOSIS — R0902 Hypoxemia: Secondary | ICD-10-CM

## 2016-05-24 DIAGNOSIS — I248 Other forms of acute ischemic heart disease: Secondary | ICD-10-CM | POA: Diagnosis present

## 2016-05-24 DIAGNOSIS — E1121 Type 2 diabetes mellitus with diabetic nephropathy: Secondary | ICD-10-CM | POA: Diagnosis present

## 2016-05-24 DIAGNOSIS — Z7982 Long term (current) use of aspirin: Secondary | ICD-10-CM

## 2016-05-24 DIAGNOSIS — A401 Sepsis due to streptococcus, group B: Secondary | ICD-10-CM | POA: Diagnosis not present

## 2016-05-24 DIAGNOSIS — I330 Acute and subacute infective endocarditis: Secondary | ICD-10-CM | POA: Diagnosis present

## 2016-05-24 DIAGNOSIS — Z841 Family history of disorders of kidney and ureter: Secondary | ICD-10-CM

## 2016-05-24 DIAGNOSIS — E1165 Type 2 diabetes mellitus with hyperglycemia: Secondary | ICD-10-CM | POA: Diagnosis present

## 2016-05-24 DIAGNOSIS — Z9079 Acquired absence of other genital organ(s): Secondary | ICD-10-CM

## 2016-05-24 DIAGNOSIS — I10 Essential (primary) hypertension: Secondary | ICD-10-CM | POA: Diagnosis present

## 2016-05-24 DIAGNOSIS — R4182 Altered mental status, unspecified: Secondary | ICD-10-CM | POA: Diagnosis present

## 2016-05-24 DIAGNOSIS — I878 Other specified disorders of veins: Secondary | ICD-10-CM | POA: Diagnosis present

## 2016-05-24 DIAGNOSIS — I959 Hypotension, unspecified: Secondary | ICD-10-CM | POA: Diagnosis present

## 2016-05-24 DIAGNOSIS — Z833 Family history of diabetes mellitus: Secondary | ICD-10-CM

## 2016-05-24 DIAGNOSIS — L03115 Cellulitis of right lower limb: Secondary | ICD-10-CM | POA: Diagnosis present

## 2016-05-24 DIAGNOSIS — Z882 Allergy status to sulfonamides status: Secondary | ICD-10-CM

## 2016-05-24 DIAGNOSIS — Z87891 Personal history of nicotine dependence: Secondary | ICD-10-CM

## 2016-05-24 DIAGNOSIS — R7881 Bacteremia: Secondary | ICD-10-CM | POA: Insufficient documentation

## 2016-05-24 DIAGNOSIS — A419 Sepsis, unspecified organism: Secondary | ICD-10-CM | POA: Diagnosis not present

## 2016-05-24 DIAGNOSIS — I110 Hypertensive heart disease with heart failure: Secondary | ICD-10-CM | POA: Diagnosis present

## 2016-05-24 DIAGNOSIS — Z79899 Other long term (current) drug therapy: Secondary | ICD-10-CM

## 2016-05-24 DIAGNOSIS — I252 Old myocardial infarction: Secondary | ICD-10-CM

## 2016-05-24 DIAGNOSIS — G4730 Sleep apnea, unspecified: Secondary | ICD-10-CM | POA: Diagnosis present

## 2016-05-24 LAB — BLOOD GAS, VENOUS
ACID-BASE DEFICIT: 0 mmol/L (ref 0.0–2.0)
BICARBONATE: 24.5 meq/L — AB (ref 20.0–24.0)
O2 CONTENT: 3 L/min
O2 SAT: 77.3 %
PATIENT TEMPERATURE: 104
TCO2: 20.6 mmol/L (ref 0–100)
pCO2, Ven: 48 mmHg (ref 45.0–50.0)
pH, Ven: 7.345 — ABNORMAL HIGH (ref 7.250–7.300)
pO2, Ven: 52.9 mmHg — ABNORMAL HIGH (ref 31.0–45.0)

## 2016-05-24 LAB — URINALYSIS, ROUTINE W REFLEX MICROSCOPIC
BILIRUBIN URINE: NEGATIVE
Glucose, UA: NEGATIVE mg/dL
Hgb urine dipstick: NEGATIVE
Ketones, ur: NEGATIVE mg/dL
Leukocytes, UA: NEGATIVE
NITRITE: NEGATIVE
PH: 5.5 (ref 5.0–8.0)
Protein, ur: 30 mg/dL — AB
SPECIFIC GRAVITY, URINE: 1.031 — AB (ref 1.005–1.030)

## 2016-05-24 LAB — I-STAT CHEM 8, ED
BUN: 25 mg/dL — AB (ref 6–20)
CALCIUM ION: 1.11 mmol/L — AB (ref 1.12–1.23)
CREATININE: 1.2 mg/dL (ref 0.61–1.24)
Chloride: 99 mmol/L — ABNORMAL LOW (ref 101–111)
GLUCOSE: 153 mg/dL — AB (ref 65–99)
HCT: 38 % — ABNORMAL LOW (ref 39.0–52.0)
HEMOGLOBIN: 12.9 g/dL — AB (ref 13.0–17.0)
Potassium: 3.9 mmol/L (ref 3.5–5.1)
Sodium: 137 mmol/L (ref 135–145)
TCO2: 26 mmol/L (ref 0–100)

## 2016-05-24 LAB — MRSA PCR SCREENING: MRSA BY PCR: NEGATIVE

## 2016-05-24 LAB — CBC WITH DIFFERENTIAL/PLATELET
Basophils Absolute: 0 10*3/uL (ref 0.0–0.1)
Basophils Relative: 0 %
Eosinophils Absolute: 0 10*3/uL (ref 0.0–0.7)
Eosinophils Relative: 0 %
HCT: 35.7 % — ABNORMAL LOW (ref 39.0–52.0)
HEMOGLOBIN: 12.1 g/dL — AB (ref 13.0–17.0)
LYMPHS ABS: 2 10*3/uL (ref 0.7–4.0)
LYMPHS PCT: 10 %
MCH: 31.9 pg (ref 26.0–34.0)
MCHC: 33.9 g/dL (ref 30.0–36.0)
MCV: 94.2 fL (ref 78.0–100.0)
Monocytes Absolute: 1 10*3/uL (ref 0.1–1.0)
Monocytes Relative: 5 %
NEUTROS PCT: 85 %
Neutro Abs: 17.9 10*3/uL — ABNORMAL HIGH (ref 1.7–7.7)
Platelets: 176 10*3/uL (ref 150–400)
RBC: 3.79 MIL/uL — AB (ref 4.22–5.81)
RDW: 14.6 % (ref 11.5–15.5)
WBC: 20.9 10*3/uL — AB (ref 4.0–10.5)

## 2016-05-24 LAB — COMPREHENSIVE METABOLIC PANEL
ALT: 30 U/L (ref 17–63)
AST: 34 U/L (ref 15–41)
Albumin: 3.6 g/dL (ref 3.5–5.0)
Alkaline Phosphatase: 58 U/L (ref 38–126)
Anion gap: 12 (ref 5–15)
BUN: 28 mg/dL — ABNORMAL HIGH (ref 6–20)
CHLORIDE: 101 mmol/L (ref 101–111)
CO2: 24 mmol/L (ref 22–32)
Calcium: 8.7 mg/dL — ABNORMAL LOW (ref 8.9–10.3)
Creatinine, Ser: 1.21 mg/dL (ref 0.61–1.24)
GFR, EST NON AFRICAN AMERICAN: 58 mL/min — AB (ref >60)
Glucose, Bld: 158 mg/dL — ABNORMAL HIGH (ref 65–99)
POTASSIUM: 3.8 mmol/L (ref 3.5–5.1)
Sodium: 137 mmol/L (ref 135–145)
Total Bilirubin: 0.8 mg/dL (ref 0.3–1.2)
Total Protein: 6.8 g/dL (ref 6.5–8.1)

## 2016-05-24 LAB — TYPE AND SCREEN
ABO/RH(D): O POS
Antibody Screen: NEGATIVE

## 2016-05-24 LAB — BLOOD CULTURE ID PANEL (REFLEXED)
ACINETOBACTER BAUMANNII: NOT DETECTED
CANDIDA ALBICANS: NOT DETECTED
CANDIDA GLABRATA: NOT DETECTED
CANDIDA KRUSEI: NOT DETECTED
Candida parapsilosis: NOT DETECTED
Candida tropicalis: NOT DETECTED
Carbapenem resistance: NOT DETECTED
ENTEROBACTERIACEAE SPECIES: NOT DETECTED
ESCHERICHIA COLI: NOT DETECTED
Enterobacter cloacae complex: NOT DETECTED
Enterococcus species: NOT DETECTED
Haemophilus influenzae: NOT DETECTED
KLEBSIELLA OXYTOCA: NOT DETECTED
Klebsiella pneumoniae: NOT DETECTED
Listeria monocytogenes: NOT DETECTED
METHICILLIN RESISTANCE: NOT DETECTED
NEISSERIA MENINGITIDIS: NOT DETECTED
PSEUDOMONAS AERUGINOSA: NOT DETECTED
Proteus species: NOT DETECTED
STAPHYLOCOCCUS SPECIES: NOT DETECTED
STREPTOCOCCUS SPECIES: DETECTED — AB
Serratia marcescens: NOT DETECTED
Staphylococcus aureus (BCID): NOT DETECTED
Streptococcus agalactiae: DETECTED — AB
Streptococcus pneumoniae: NOT DETECTED
Streptococcus pyogenes: NOT DETECTED
Vancomycin resistance: NOT DETECTED

## 2016-05-24 LAB — I-STAT CG4 LACTIC ACID, ED
LACTIC ACID, VENOUS: 4.35 mmol/L — AB (ref 0.5–1.9)
Lactic Acid, Venous: 3.27 mmol/L (ref 0.5–1.9)

## 2016-05-24 LAB — GLUCOSE, CAPILLARY
GLUCOSE-CAPILLARY: 121 mg/dL — AB (ref 65–99)
Glucose-Capillary: 147 mg/dL — ABNORMAL HIGH (ref 65–99)
Glucose-Capillary: 98 mg/dL (ref 65–99)

## 2016-05-24 LAB — URINE MICROSCOPIC-ADD ON: RBC / HPF: NONE SEEN RBC/hpf (ref 0–5)

## 2016-05-24 LAB — PROTIME-INR
INR: 1.26 (ref 0.00–1.49)
INR: 1.38 (ref 0.00–1.49)
PROTHROMBIN TIME: 15.5 s — AB (ref 11.6–15.2)
PROTHROMBIN TIME: 16.5 s — AB (ref 11.6–15.2)

## 2016-05-24 LAB — CBG MONITORING, ED: Glucose-Capillary: 159 mg/dL — ABNORMAL HIGH (ref 65–99)

## 2016-05-24 LAB — PROCALCITONIN: Procalcitonin: 2.68 ng/mL

## 2016-05-24 LAB — CORTISOL: Cortisol, Plasma: 28.5 ug/dL

## 2016-05-24 LAB — I-STAT TROPONIN, ED: TROPONIN I, POC: 0.01 ng/mL (ref 0.00–0.08)

## 2016-05-24 LAB — TROPONIN I
Troponin I: 0.06 ng/mL (ref <0.03)
Troponin I: 0.11 ng/mL (ref <0.03)

## 2016-05-24 LAB — BRAIN NATRIURETIC PEPTIDE: B Natriuretic Peptide: 415.4 pg/mL — ABNORMAL HIGH (ref 0.0–100.0)

## 2016-05-24 LAB — LACTIC ACID, PLASMA
LACTIC ACID, VENOUS: 2.5 mmol/L — AB (ref 0.5–1.9)
Lactic Acid, Venous: 2.6 mmol/L (ref 0.5–1.9)

## 2016-05-24 LAB — APTT: APTT: 25 s (ref 24–37)

## 2016-05-24 MED ORDER — ENOXAPARIN SODIUM 40 MG/0.4ML ~~LOC~~ SOLN
40.0000 mg | SUBCUTANEOUS | Status: DC
  Administered 2016-05-24 – 2016-06-03 (×11): 40 mg via SUBCUTANEOUS
  Filled 2016-05-24 (×12): qty 0.4

## 2016-05-24 MED ORDER — PIPERACILLIN-TAZOBACTAM 3.375 G IVPB 30 MIN
3.3750 g | Freq: Once | INTRAVENOUS | Status: AC
  Administered 2016-05-24: 3.375 g via INTRAVENOUS
  Filled 2016-05-24: qty 50

## 2016-05-24 MED ORDER — VANCOMYCIN HCL 10 G IV SOLR
1250.0000 mg | INTRAVENOUS | Status: DC
  Administered 2016-05-25 – 2016-05-27 (×3): 1250 mg via INTRAVENOUS
  Filled 2016-05-24 (×3): qty 1250

## 2016-05-24 MED ORDER — SODIUM CHLORIDE 0.9 % IV BOLUS (SEPSIS)
30.0000 mL/kg | Freq: Once | INTRAVENOUS | Status: AC
Start: 2016-05-24 — End: 2016-05-24
  Administered 2016-05-24: 3510 mL via INTRAVENOUS

## 2016-05-24 MED ORDER — ONDANSETRON HCL 4 MG/2ML IJ SOLN
4.0000 mg | Freq: Four times a day (QID) | INTRAMUSCULAR | Status: DC | PRN
  Administered 2016-05-25 – 2016-05-27 (×3): 4 mg via INTRAVENOUS
  Filled 2016-05-24 (×3): qty 2

## 2016-05-24 MED ORDER — ACETAMINOPHEN 325 MG PO TABS
650.0000 mg | ORAL_TABLET | Freq: Four times a day (QID) | ORAL | Status: DC | PRN
  Administered 2016-05-27: 650 mg via ORAL
  Filled 2016-05-24: qty 2

## 2016-05-24 MED ORDER — VANCOMYCIN HCL IN DEXTROSE 1-5 GM/200ML-% IV SOLN
1000.0000 mg | Freq: Once | INTRAVENOUS | Status: AC
  Administered 2016-05-24: 1000 mg via INTRAVENOUS
  Filled 2016-05-24: qty 200

## 2016-05-24 MED ORDER — ACETAMINOPHEN 325 MG PO TABS
650.0000 mg | ORAL_TABLET | Freq: Once | ORAL | Status: AC
  Administered 2016-05-24: 650 mg via ORAL
  Filled 2016-05-24: qty 2

## 2016-05-24 MED ORDER — ATORVASTATIN CALCIUM 10 MG PO TABS: 10.0000 mg | ORAL_TABLET | Freq: Every day | ORAL | Status: DC

## 2016-05-24 MED ORDER — NIACIN ER 500 MG PO TBCR: 2000.0000 mg | EXTENDED_RELEASE_TABLET | Freq: Every day | ORAL | Status: DC

## 2016-05-24 MED ORDER — SODIUM CHLORIDE 0.9 % IV SOLN
INTRAVENOUS | Status: DC
  Administered 2016-05-24: 15:00:00 via INTRAVENOUS

## 2016-05-24 MED ORDER — INSULIN ASPART 100 UNIT/ML ~~LOC~~ SOLN
0.0000 [IU] | Freq: Three times a day (TID) | SUBCUTANEOUS | Status: DC
  Administered 2016-05-24 – 2016-05-27 (×7): 2 [IU] via SUBCUTANEOUS
  Administered 2016-05-28: 3 [IU] via SUBCUTANEOUS
  Administered 2016-05-29: 2 [IU] via SUBCUTANEOUS
  Administered 2016-05-30: 3 [IU] via SUBCUTANEOUS
  Administered 2016-05-30 – 2016-06-03 (×7): 2 [IU] via SUBCUTANEOUS

## 2016-05-24 MED ORDER — ASPIRIN 325 MG PO TABS
325.0000 mg | ORAL_TABLET | Freq: Every evening | ORAL | Status: DC
  Administered 2016-05-24 – 2016-05-27 (×4): 325 mg via ORAL
  Filled 2016-05-24 (×4): qty 1

## 2016-05-24 MED ORDER — PIPERACILLIN-TAZOBACTAM 3.375 G IVPB
3.3750 g | Freq: Three times a day (TID) | INTRAVENOUS | Status: DC
  Administered 2016-05-24 – 2016-05-27 (×9): 3.375 g via INTRAVENOUS
  Filled 2016-05-24 (×9): qty 50

## 2016-05-24 NOTE — ED Provider Notes (Signed)
CSN: [id number]     Arrival date & time 05/24/16  1013 History   First MD Initiated Contact with Patient 05/24/16 1026     Chief Complaint  Patient presents with  . Fever  . Altered Mental Status    The history is provided by the patient and a relative. No language interpreter was used.   Theodore [name] is a 70 y.o. male who presents to the Emergency Department complaining of fever, confusion.  Level V caveat due to confusion.  Hx is provided by the patient and his grandson.  Grandson reports increased generalized weakness, confusion, SOB, leg swelling and redness over the last day and a half.  Pt reports nausea, vomiting, diarrhea.  He has no complaints of pain.    Past Medical History  Diagnosis Date  . PVD (peripheral vascular disease) ([place])   . 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 Hyperlipidemia   . Hypertension   . Diabetes mellitus     controlled  . Prostate cancer [facility])     prostate  . Paroxysmal nocturnal dyspnea   . Cellulitis 10/18-20/2012    RLE   . Diverticulitis 02/19/13  . CAD (coronary artery disease)   . Gallstones    Past Surgical History  Procedure Laterality Date  . Prostate surgery    . Penile prosthesis  removal      swich(sphincter)...removed 04-2008, replaced 02-2009  . Cholecystectomy    . Tonsillectomy    . Cervical disc surgery  1988   Family History  Problem Relation Age of Onset  . Seizures Father   . Gout Brother   . Kidney disease Brother     kidney failure   . Stroke Father   . Pneumonia Mother   . Diabetes Father   . Diabetes Brother   . Alcoholism Mother   . Heart disease Father    Social History  Substance Use Topics  . Smoking status: Former Smoker    Types: Cigarettes    Quit date: 05/17/1986  . Smokeless tobacco: Never Used  . Alcohol Use: No    Review of Systems  All other systems reviewed and are negative.     Allergies  Sulfonamide derivatives  Home Medications   Prior to Admission medications    Medication Sig Start Date End Date Taking? Authorizing Provider  aspirin 325 MG tablet Take 325 mg by mouth every evening.     Historical Provider, MD  atorvastatin (LIPITOR) 20 MG tablet Take 10 mg by mouth at bedtime.     Historical Provider, MD  clotrimazole-betamethasone (LOTRISONE) cream Apply 1 application topically 2 (two) times daily.    Historical Provider, MD  colestipol (COLESTID) 5 g granules TAKE 5 GRAMS BY MOUTH DAILY WITH SUPPER. 03/22/16   Gatha Mayer, MD  doxazosin (CARDURA) 8 MG tablet Take 4 mg by mouth daily. 1/2 by mouth daily    Historical Provider, MD  doxycycline (VIBRA-TABS) 100 MG tablet Take 1 tablet (100 mg total) by mouth 2 (two) times daily. 05/03/16   Golden Circle, FNP  furosemide (LASIX) 20 MG tablet Take 1 tablet (20 mg total) by mouth 2 (two) times daily. 09/26/11   Josue Hector, MD  Leuprolide Acetate (LUPRON IJ) Inject as directed. 1 injection every 4 months     Historical Provider, MD  losartan (COZAAR) 100 MG tablet Take 50 mg by mouth daily. 1/2 BY MOUTH DAILY    Historical Provider, MD  metFORMIN (GLUCOPHAGE) 500 MG  tablet Take 500 mg by mouth daily.     Historical Provider, MD  metoprolol (LOPRESSOR) 100 MG tablet Take 100 mg by mouth 2 (two) times daily.    Historical Provider, MD  Multiple Vitamins-Minerals (CENTRUM SILVER PO) Take 1 tablet by mouth daily.     Historical Provider, MD  niacin (SLO-NIACIN) 500 MG tablet Take 2,000 mg by mouth at bedtime. 4 BY MOUTH AT BEDTIME    Historical Provider, MD  traMADol (ULTRAM) 50 MG tablet Take 1 tablet (50 mg total) by mouth every 8 (eight) hours as needed. 05/03/16   Golden Circle, FNP   BP 103/58 mmHg  Pulse 110  Temp(Src) 102.4 F (39.1 C) (Rectal)  Resp 26  Ht 5\' 5"  (1.651 m)  Wt 258 lb (117.028 kg)  BMI 42.93 kg/m2  SpO2 97% Physical Exam  Constitutional: He appears well-developed. He appears distressed.  Ill appearing  HENT:  Head: Normocephalic and atraumatic.  Cardiovascular: Regular  rhythm.   No murmur heard. tachycardic  Pulmonary/Chest: No respiratory distress.  tachypneic with decreased air movement in bilateral bases  Abdominal: Soft. There is no tenderness. There is no rebound and no guarding.  Musculoskeletal:  2-3+ edema in BLE with erythema distally bilaterally.  Erythema of right leg extends to mid right calf.    Neurological:  Drowsy but responds to verbal stimuli, generalized weakness  Skin: Skin is warm and dry.  Psychiatric:  Unable to assess  Nursing note and vitals reviewed.   ED Course  Procedures CRITICAL CARE Performed by: Quintella Reichert   Total critical care time: 30 minutes  Critical care time was exclusive of separately billable procedures and treating other patients.  Critical care was necessary to treat or prevent imminent or life-threatening deterioration.  Critical care was time spent personally by me on the following activities: development of treatment plan with patient and/or surrogate as well as nursing, discussions with consultants, evaluation of patient's response to treatment, examination of patient, obtaining history from patient or surrogate, ordering and performing treatments and interventions, ordering and review of laboratory studies, ordering and review of radiographic studies, pulse oximetry and re-evaluation of patient's condition.  Labs Review Labs Reviewed  COMPREHENSIVE METABOLIC PANEL - Abnormal; Notable for the following:    Glucose, Bld 158 (*)    BUN 28 (*)    Calcium 8.7 (*)    GFR calc non Af Amer 58 (*)    All other components within normal limits  CBC WITH DIFFERENTIAL/PLATELET - Abnormal; Notable for the following:    WBC 20.9 (*)    RBC 3.79 (*)    Hemoglobin 12.1 (*)    HCT 35.7 (*)    Neutro Abs 17.9 (*)    All other components within normal limits  URINALYSIS, ROUTINE W REFLEX MICROSCOPIC (NOT AT [facility]) - Abnormal; Notable for the following:    APPearance TURBID (*)    Specific Gravity, Urine  1.031 (*)    Protein, ur 30 (*)    All other components within normal limits  BRAIN NATRIURETIC PEPTIDE - Abnormal; Notable for the following:    B Natriuretic Peptide 415.4 (*)    All other components within normal limits  BLOOD GAS, VENOUS - Abnormal; Notable for the following:    pH, Ven 7.345 (*)    pO2, Ven 52.9 (*)    Bicarbonate 24.5 (*)    All other components within normal limits  PROTIME-INR - Abnormal; Notable for the following:    Prothrombin Time 15.5 (*)  All other components within normal limits  URINE MICROSCOPIC-ADD ON - Abnormal; Notable for the following:    Squamous Epithelial / LPF 0-5 (*)    Bacteria, UA RARE (*)    Crystals URIC ACID CRYSTALS (*)    All other components within normal limits  PROTIME-INR - Abnormal; Notable for the following:    Prothrombin Time 16.5 (*)    All other components within normal limits  I-STAT CG4 LACTIC ACID, ED - Abnormal; Notable for the following:    Lactic Acid, Venous 4.35 (*)    All other components within normal limits  I-STAT CHEM 8, ED - Abnormal; Notable for the following:    Chloride 99 (*)    BUN 25 (*)    Glucose, Bld 153 (*)    Calcium, Ion 1.11 (*)    Hemoglobin 12.9 (*)    HCT 38.0 (*)    All other components within normal limits  CBG MONITORING, ED - Abnormal; Notable for the following:    Glucose-Capillary 159 (*)    All other components within normal limits  I-STAT CG4 LACTIC ACID, ED - Abnormal; Notable for the following:    Lactic Acid, Venous 3.27 (*)    All other components within normal limits  CULTURE, BLOOD (ROUTINE X 2)  CULTURE, BLOOD (ROUTINE X 2)  URINE CULTURE  MRSA PCR SCREENING  APTT  LACTIC ACID, PLASMA  LACTIC ACID, PLASMA  URINALYSIS, ROUTINE W REFLEX MICROSCOPIC (NOT AT [facility])  CORTISOL  TROPONIN I  PROCALCITONIN  I-STAT TROPOININ, ED  TYPE AND SCREEN    Imaging Review Dg Chest Port 1 View  05/24/2016  CLINICAL DATA:  Shortness of breath, fever and altered mental status  for approximately 1-1/2 days. EXAM: PORTABLE CHEST 1 VIEW COMPARISON:  PA and lateral chest 05/13/2008. CT abdomen and pelvis 06/15/2015 FINDINGS: Innumerable sclerotic lesions are identified about the shoulders and in multiple ribs consistent with metastatic prostate carcinoma, new since the prior chest films. Bone metastases are seen on the prior CT Lung volumes are somewhat low. No pneumothorax or pleural effusion is seen. No consolidative process is identified. Heart size is normal. IMPRESSION: No acute cardiopulmonary disease. Innumerable sclerotic osseous lesions consistent with metastatic prostate carcinoma. Electronically Signed   By: Inge Rise M.D.   On: 05/24/2016 11:09   I have personally reviewed and evaluated these images and lab results as part of my medical decision-making.   EKG Interpretation   Date/Time:  Tuesday May 24 2016 10:27:05 EDT Ventricular Rate:  109 PR Interval:    QRS Duration: 97 QT Interval:  306 QTC Calculation: 412 R Axis:   71 Text Interpretation:  Sinus tachycardia Atrial premature complexes Low  voltage, precordial leads Confirmed by Hazle Coca [phone]) on 05/24/2016  10:59:02 AM Also confirmed by Hazle Coca [phone]), editor Yehuda Mao  [phone])  on 05/24/2016 11:30:37 AM      MDM   Final diagnoses:  Cellulitis of right lower extremity  Sepsis, due to unspecified organism ([place])    Pt here with confusion, weakness.  Pt ill appearing on exam with cellulitis to the RLE.  He was started on broad spectrum abx and IVF resuscitation.  Critical Care consulted given elevated lactate and evaluated the patient in consult. Hospitalist consulted for admission.    On repeat assessment following IVF his mental status has improved and his lactate improved.      Quintella Reichert, MD 05/24/16 1530

## 2016-05-24 NOTE — ED Notes (Signed)
Patient's grandson reports that patient has had a fever and altered mental status for about a day and a half. Sores located on bilateral legs. Patient is nausea and dry heaving during triage. MD at bedside.

## 2016-05-24 NOTE — Progress Notes (Signed)
Utilization Review completed.  Nycere Presley RN CM  

## 2016-05-24 NOTE — Progress Notes (Signed)
Pharmacy Antibiotic Note  Theodore [name] is a 70 y.o. male admitted on 05/24/2016 with fever and sores/cellulitis on bilateral legs. Noted history of DM2, PVD.  Pharmacy has been consulted for vancomycin and Zosyn dosing.  Vanc 1g and Zosyn 3.375g given in ED while awaiting SCr.  Plan: Vancomycin 1250 mg IV q24h for CrCl~56 ml/min/1.68m2. Zosyn 3.375g IV q8h (4 hour infusion time).  F/u SCr, trough levels as indicated, culture results.     No data recorded.  No results for input(s): WBC, CREATININE, LATICACIDVEN, VANCOTROUGH, VANCOPEAK, VANCORANDOM, GENTTROUGH, GENTPEAK, GENTRANDOM, TOBRATROUGH, TOBRAPEAK, TOBRARND, AMIKACINPEAK, AMIKACINTROU, AMIKACIN in the last 168 hours.  Estimated Creatinine Clearance: 80.2 mL/min (by C-G formula based on Cr of 1).    Allergies  Allergen Reactions  . Sulfonamide Derivatives     "reaction" as infant according to his mother    Antimicrobials this admission: 7/11 Vanc >>  7/11 Zosyn >>   Dose adjustments this admission: -  Microbiology results: 7/11 BCx: sent 7/11 UCx: sent   Thank you for allowing pharmacy to be a part of this patient's care.  Theodore [name] 05/24/2016 10:33 AM

## 2016-05-24 NOTE — Consult Note (Signed)
WOC wound consult note Reason for Consult: Bilateral weeping dermatitis of LEs, cellulitis, L>R Wound type:venous insufficiency Pressure Ulcer POA: No Measurement: Wounds on LLE are cluster of partial thickness openings, the largest of which measures 1.5cm x 1cm x 0.1cm, Wound on RLE measures 1cm round and has a dried serum (scab) covering and obscuring depth. Wound bed: pink, moist on LLE Drainage (amount, consistency, odor) serous, moderate to large amount Periwound: mild erythema, early hemosiderin staining Dressing procedure/placement/frequency: I will recommend placement of bilateral Unna's Boots, twice weekly this week and once weekly thereafter.  Patient has had Kerlix wrap with Coban in the past, and it has reduced swelling (by his account).  He spends most of the day in his recliner with his legs in a dependent position. Patient will require Unna's Boot changes either by a [facility] or by his PCP or in the outpatient wound care center of his choosing.  Upon discharge, please order/arrange if you agree. Prevalon Boots are provided while in bed here to prevent pressure injury and a pressure reduction chair cushion is provided for his use when OOB in the chair. LEs are to be elevated at all times while in bed and chair. A prophylactic foam dressing is to be placed to prevent sacral pressure injury while he is in the supine position. [facility] nursing team will not follow, but will remain available to this patient, the nursing and medical teams.  Please re-consult if needed. Thanks, Maudie Flakes, MSN, RN, [place], Arther Abbott  Pager# [phone]

## 2016-05-24 NOTE — ED Notes (Signed)
Hospitalist at bedside 

## 2016-05-24 NOTE — ED Notes (Signed)
Dr. Ralene Bathe made aware of critical lactic

## 2016-05-24 NOTE — Progress Notes (Signed)
PHARMACY - PHYSICIAN COMMUNICATION CRITICAL VALUE ALERT - BLOOD CULTURE IDENTIFICATION (BCID)  Results for orders placed or performed during the hospital encounter of 05/24/16  Blood Culture ID Panel (Reflexed) (Collected: 05/24/2016 10:42 AM)  Result Value Ref Range   Enterococcus species NOT DETECTED NOT DETECTED   Vancomycin resistance NOT DETECTED NOT DETECTED   Listeria monocytogenes NOT DETECTED NOT DETECTED   Staphylococcus species NOT DETECTED NOT DETECTED   Staphylococcus aureus NOT DETECTED NOT DETECTED   Methicillin resistance NOT DETECTED NOT DETECTED   Streptococcus species DETECTED (A) NOT DETECTED   Streptococcus agalactiae DETECTED (A) NOT DETECTED   Streptococcus pneumoniae NOT DETECTED NOT DETECTED   Streptococcus pyogenes NOT DETECTED NOT DETECTED   Acinetobacter baumannii NOT DETECTED NOT DETECTED   Enterobacteriaceae species NOT DETECTED NOT DETECTED   Enterobacter cloacae complex NOT DETECTED NOT DETECTED   Escherichia coli NOT DETECTED NOT DETECTED   Klebsiella oxytoca NOT DETECTED NOT DETECTED   Klebsiella pneumoniae NOT DETECTED NOT DETECTED   Proteus species NOT DETECTED NOT DETECTED   Serratia marcescens NOT DETECTED NOT DETECTED   Carbapenem resistance NOT DETECTED NOT DETECTED   Haemophilus influenzae NOT DETECTED NOT DETECTED   Neisseria meningitidis NOT DETECTED NOT DETECTED   Pseudomonas aeruginosa NOT DETECTED NOT DETECTED   Candida albicans NOT DETECTED NOT DETECTED   Candida glabrata NOT DETECTED NOT DETECTED   Candida krusei NOT DETECTED NOT DETECTED   Candida parapsilosis NOT DETECTED NOT DETECTED   Candida tropicalis NOT DETECTED NOT DETECTED    Name of physician (or Provider) Contacted: Tylene Fantasia  Changes to prescribed antibiotics required: Antibiotic can be narrowed to PCN G.  Dorrene German 05/24/2016  11:49 PM

## 2016-05-24 NOTE — H&P (Addendum)
History and Physical    [name] [id number] DOB: November 06, 1946 DOA: 05/24/2016  PCP: [name], FNP  Patient coming from: Home   Chief Complaint: AMS, fever   HPI: Theodore [name] is a 70 y.o. male with medical history significant of metastatic prostate cancer, DM, CAD, PVD who presents with AMS, fever. Per report patient has been confuse,sleepy for last day. His confusion and lethargic has improved after fluids and treatment for fever in the ED. He report Bilateral lower extremities redness, edema and drainage for last 3 weeks. He saw his PCP 3 weeks ago and was prescribe doxycycline. He finished course and saw PCP 1 week ago and was recommended to do locar care,rapping of the legs. He relates that redness and drainage has persisted for last 3 weeks.he develops fever 3 days ago. He also relates dyspnea,worse when he lying down flat,for years.   ED Course: Presents with fever, hypoxemia,WBC 20, lactic acid 4.3, Chest x ray no acute cardiopulmonary diseases, numerous osseous lesions consistent with metastatic prostate cancer,   Review of Systems: As per HPI otherwise 10 point review of systems negative.    Past Medical History  Diagnosis Date  . PVD (peripheral vascular disease) ([place])   . 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 Hyperlipidemia   . Hypertension   . Diabetes mellitus     controlled  . Prostate cancer [facility])     prostate  . Paroxysmal nocturnal dyspnea   . Cellulitis 10/18-20/2012    RLE   . Diverticulitis 02/19/13  . CAD (coronary artery disease)   . Gallstones     Past Surgical History  Procedure Laterality Date  . Prostate surgery    . Penile prosthesis  removal      swich(sphincter)...removed 04-2008, replaced 02-2009  . Cholecystectomy    . Tonsillectomy    . Cervical disc surgery  1988     reports that he quit smoking about 30 years ago. His smoking use included Cigarettes. He has never used smokeless tobacco. He reports that he does  not drink alcohol or use illicit drugs.  Allergies  Allergen Reactions  . Sulfonamide Derivatives     "reaction" as infant according to his mother    Family History  Problem Relation Age of Onset  . Seizures Father   . Gout Brother   . Kidney disease Brother     kidney failure   . Stroke Father   . Pneumonia Mother   . Diabetes Father   . Diabetes Brother   . Alcoholism Mother   . Heart disease Father     Prior to Admission medications   Medication Sig Start Date End Date Taking? Authorizing Provider  aspirin 325 MG tablet Take 325 mg by mouth every evening.     Historical Provider, MD  atorvastatin (LIPITOR) 20 MG tablet Take 10 mg by mouth at bedtime.     Historical Provider, MD  clotrimazole-betamethasone (LOTRISONE) cream Apply 1 application topically 2 (two) times daily.    Historical Provider, MD  colestipol (COLESTID) 5 g granules TAKE 5 GRAMS BY MOUTH DAILY WITH SUPPER. 03/22/16   Gatha Mayer, MD  doxazosin (CARDURA) 8 MG tablet Take 4 mg by mouth daily. 1/2 by mouth daily    Historical Provider, MD  doxycycline (VIBRA-TABS) 100 MG tablet Take 1 tablet (100 mg total) by mouth 2 (two) times daily. 05/03/16   Golden Circle, FNP  furosemide (LASIX) 20 MG tablet Take 1 tablet (20  mg total) by mouth 2 (two) times daily. 09/26/11   Josue Hector, MD  Leuprolide Acetate (LUPRON IJ) Inject as directed. 1 injection every 4 months     Historical Provider, MD  losartan (COZAAR) 100 MG tablet Take 50 mg by mouth daily. 1/2 BY MOUTH DAILY    Historical Provider, MD  metFORMIN (GLUCOPHAGE) 500 MG tablet Take 500 mg by mouth daily.     Historical Provider, MD  metoprolol (LOPRESSOR) 100 MG tablet Take 100 mg by mouth 2 (two) times daily.    Historical Provider, MD  Multiple Vitamins-Minerals (CENTRUM SILVER PO) Take 1 tablet by mouth daily.     Historical Provider, MD  niacin (SLO-NIACIN) 500 MG tablet Take 2,000 mg by mouth at bedtime. 4 BY MOUTH AT BEDTIME    Historical Provider,  MD  traMADol (ULTRAM) 50 MG tablet Take 1 tablet (50 mg total) by mouth every 8 (eight) hours as needed. 05/03/16   Golden Circle, FNP    Physical Exam: Filed Vitals:   05/24/16 1230 05/24/16 1300 05/24/16 1330 05/24/16 1333  BP: 115/61 107/48 103/58   Pulse: 115 109 110   Temp:    102.4 F (39.1 C)  TempSrc:    Rectal  Resp: 16 30 26    Height:      Weight:      SpO2: 97% 97% 97%       Constitutional: NAD, calm, comfortable, obese Filed Vitals:   05/24/16 1230 05/24/16 1300 05/24/16 1330 05/24/16 1333  BP: 115/61 107/48 103/58   Pulse: 115 109 110   Temp:    102.4 F (39.1 C)  TempSrc:    Rectal  Resp: 16 30 26    Height:      Weight:      SpO2: 97% 97% 97%    Eyes: PERRL, lids and conjunctivae normal ENMT: Mucous membranes are moist. Posterior pharynx clear of any exudate or lesions.Normal dentition.  Neck: normal, supple, no masses, no thyromegaly Respiratory: distant lung sounds, Normal respiratory effort. No accessory muscle use.  Cardiovascular: Regular rate and rhythm, no murmurs / rubs / gallops. No extremity edema. 2+ pedal pulses. No carotid bruits.  Abdomen: no tenderness, no masses palpated. No hepatosplenomegaly. Bowel sounds positive.  Musculoskeletal: no clubbing / cyanosis. No joint deformity upper and lower extremities. Good ROM, no contractures. Normal muscle tone.  Skin: Bilateral lower extremities redness,drainage,skin tear,ulceration, worse on theright  Neurologic: CN 2-12 grossly intact. Sensation intact, DTR normal. Strength 5/5 in all 4.  Psychiatric: Normal judgment and insight. Alert and oriented x 3. Normal mood.    Labs on Admission: I have personally reviewed following labs and imaging studies  CBC:  Recent Labs Lab 05/24/16 1031 05/24/16 1042  WBC 20.9*  --   NEUTROABS 17.9*  --   HGB 12.1* 12.9*  HCT 35.7* 38.0*  MCV 94.2  --   PLT 176  --    Basic Metabolic Panel:  Recent Labs Lab 05/24/16 1031 05/24/16 1042  NA 137 137   K 3.8 3.9  CL 101 99*  CO2 24  --   GLUCOSE 158* 153*  BUN 28* 25*  CREATININE 1.21 1.20  CALCIUM 8.7*  --    GFR: Estimated Creatinine Clearance: 66.8 mL/min (by C-G formula based on Cr of 1.2). Liver Function Tests:  Recent Labs Lab 05/24/16 1031  AST 34  ALT 30  ALKPHOS 58  BILITOT 0.8  PROT 6.8  ALBUMIN 3.6   No results for input(s): LIPASE, AMYLASE in  the last 168 hours. No results for input(s): AMMONIA in the last 168 hours. Coagulation Profile:  Recent Labs Lab 05/24/16 1031  INR 1.26   Cardiac Enzymes: No results for input(s): CKTOTAL, CKMB, CKMBINDEX, TROPONINI in the last 168 hours. BNP (last 3 results) No results for input(s): PROBNP in the last 8760 hours. HbA1C: No results for input(s): HGBA1C in the last 72 hours. CBG:  Recent Labs Lab 05/24/16 1027  GLUCAP 159*   Lipid Profile: No results for input(s): CHOL, HDL, LDLCALC, TRIG, CHOLHDL, LDLDIRECT in the last 72 hours. Thyroid Function Tests: No results for input(s): TSH, T4TOTAL, FREET4, T3FREE, THYROIDAB in the last 72 hours. Anemia Panel: No results for input(s): VITAMINB12, FOLATE, FERRITIN, TIBC, IRON, RETICCTPCT in the last 72 hours. Urine analysis:    Component Value Date/Time   COLORURINE YELLOW 05/24/2016 1225   APPEARANCEUR TURBID* 05/24/2016 1225   LABSPEC 1.031* 05/24/2016 1225   PHURINE 5.5 05/24/2016 1225   GLUCOSEU NEGATIVE 05/24/2016 1225   HGBUR NEGATIVE 05/24/2016 [place] 05/24/2016 1225   KETONESUR NEGATIVE 05/24/2016 1225   PROTEINUR 30* 05/24/2016 1225   NITRITE NEGATIVE 05/24/2016 1225   LEUKOCYTESUR NEGATIVE 05/24/2016 1225   Sepsis Labs: !!!!!!!!!!!!!!!!!!!!!!!!!!!!!!!!!!!!!!!!!!!! @LABRCNTIP (procalcitonin:4,lacticidven:4) )No results found for this or any previous visit (from the past 240 hour(s)).   Radiological Exams on Admission: Dg Chest Port 1 View  05/24/2016  CLINICAL DATA:  Shortness of breath, fever and altered mental status  for approximately 1-1/2 days. EXAM: PORTABLE CHEST 1 VIEW COMPARISON:  PA and lateral chest 05/13/2008. CT abdomen and pelvis 06/15/2015 FINDINGS: Innumerable sclerotic lesions are identified about the shoulders and in multiple ribs consistent with metastatic prostate carcinoma, new since the prior chest films. Bone metastases are seen on the prior CT Lung volumes are somewhat low. No pneumothorax or pleural effusion is seen. No consolidative process is identified. Heart size is normal. IMPRESSION: No acute cardiopulmonary disease. Innumerable sclerotic osseous lesions consistent with metastatic prostate carcinoma. Electronically Signed   By: Inge Rise M.D.   On: 05/24/2016 11:09    EKG: Independently reviewed. Sinus tachycardia.   Assessment/Plan Principal Problem:   Sepsis ([place]) Active Problems:   Type 2 diabetes mellitus with hyperglycemia (HCC)   Essential hypertension   CAD (coronary artery disease)   Acute respiratory failure with hypoxia ([place])     1-Sepsis; in setting of Cellulitis bilateral lower extremities.  Presents with fever,leukocytosis at 20, lactic acidosis at 4, tachypnea, hypoxemia  Admit to step down unit.  Received 3 .5 L in the ED.  Continue with IV fluids NS.  IV vancomycn and Zosyn.  Follow blood culture,cortisol level.  Lactic acid on admission at 4.5---trending down 3. 5.   2-HTN; hold BP medicationsin setting of sepsis/   3-Cellulitis,Bilateral Extremities.  IV vancomycin and Zosyn.  Wound care consulted.   4-DM; Hold metformin. SSI.  5-CAD; Continue with aspirin.   6-Acute hypoxic Respiratory failure;  Suspect insetting of sepsis. Monitor for ARDS.  Oxygen supplementation.  Chest x ray with no pulmonary edema.   7-Prostate cancer; follow with urology 8-Acute encephalopathy; improved. In setting of sepsis.   DVT prophylaxis: loveox Code Status: Full code,per patient and wife.  Family Communication: wife at bedside.  Disposition Plan: home  in 2 to 3 days  Consults called: CCM  Admission status: inpatient. Step down unit    Niel Hummer A MD [facility] Pager [phone]  If 7PM-7AM, please contact night-coverage [email or web address] Password [email or web address]  05/24/2016, 1:35 PM

## 2016-05-24 NOTE — ED Notes (Signed)
Notified Dr. Ralene Bathe of abnormal lactic.

## 2016-05-24 NOTE — Consult Note (Signed)
PULMONARY / CRITICAL CARE MEDICINE   Name: Theodore [name] MRN: [id number] DOB: Feb 08, 1946    ADMISSION DATE:  05/24/2016 CONSULTATION DATE:  05/24/16   REFERRING MD:  Dr. Ralene Bathe   CHIEF COMPLAINT:  AMS, Hypotension   HISTORY OF PRESENT ILLNESS:   70 year old male with a past medical history of coronary artery disease status post MI, hyperlipidemia, hypertension, peripheral vascular disease, diabetes mellitus, call stones, paroxysmal nocturnal dyspnea, bilateral lower extremity cellulitis (2012) and prostate cancer status post prostatectomy who presented to [facility] on 05/24/16 with reports of altered mental status, hypotension and fever.  The patient was accompanied to the ER by his grandson who reports he went to check on the patient the day of presentation and noted him to be altered.  He reports he spoke to the patient yesterday and was in his normal state of health. He believes fever developed overnight and has concerns for lower extremity wounds/cellulitis. He reports that the patient was unable to hold normal conversation and was nodding off. He was unable to stand without assistance prompting EMS evaluation.  ER evaluation noted the patient to have nausea with dry heaves. Initial evaluation notable for fever of 104.2, tachycardia and tachypnea, BP 120/52 and room air saturations of 84%.  The patient was treated with supplemental oxygen. Blood pressure review shows lowest systolic of 93. Labs - NA 137, K3.8, CO2 24, BUN 28, creatinine 1.21, glucose 158, BNP 4:15, troponin 0.01, WBC 20.9, hemoglobin 12.1, and platelets 176.  There is report of a lactic acid of 4.3 (assessed prior to IV fluids) however, this does not show in the system.  Initial chest x-ray showed no acute pulmonary process, numerous osseous lesions consistent with metastatic prostate cancer.  Urinalysis is negative for nitrites, rare bacteria, protein 30 & uric acid crystals.  The patient was treated with empiric  vancomycin, zosyn  and IVF resuscitation.  Mental status improved after volume resuscitation.  PCCM consulted for evaluation.   PAST MEDICAL HISTORY :  He  has a past medical history of PVD (peripheral vascular disease) ([place]); Myocardial infarction ([place]); Hyperlipidemia; Hypertension; Diabetes mellitus; Prostate cancer ([place]); Paroxysmal nocturnal dyspnea; Cellulitis (10/18-20/2012); Diverticulitis (02/19/13); CAD (coronary artery disease); and Gallstones.  PAST SURGICAL HISTORY: He  has past surgical history that includes Prostate surgery; Penile prosthesis removal; Cholecystectomy; Tonsillectomy; and Cervical disc surgery (1988).  Allergies  Allergen Reactions  . Sulfonamide Derivatives     "reaction" as infant according to his mother    No current facility-administered medications on file prior to encounter.   Current Outpatient Prescriptions on File Prior to Encounter  Medication Sig  . aspirin 325 MG tablet Take 325 mg by mouth every evening.   Marland Kitchen atorvastatin (LIPITOR) 20 MG tablet Take 10 mg by mouth at bedtime.   . clotrimazole-betamethasone (LOTRISONE) cream Apply 1 application topically 2 (two) times daily.  . colestipol (COLESTID) 5 g granules TAKE 5 GRAMS BY MOUTH DAILY WITH SUPPER.  Marland Kitchen doxazosin (CARDURA) 8 MG tablet Take 4 mg by mouth daily. 1/2 by mouth daily  . doxycycline (VIBRA-TABS) 100 MG tablet Take 1 tablet (100 mg total) by mouth 2 (two) times daily.  . furosemide (LASIX) 20 MG tablet Take 1 tablet (20 mg total) by mouth 2 (two) times daily.  Marland Kitchen Leuprolide Acetate (LUPRON IJ) Inject as directed. 1 injection every 4 months   . losartan (COZAAR) 100 MG tablet Take 50 mg by mouth daily. 1/2 BY MOUTH DAILY  . metFORMIN (GLUCOPHAGE) 500 MG tablet Take  500 mg by mouth daily.   . metoprolol (LOPRESSOR) 100 MG tablet Take 100 mg by mouth 2 (two) times daily.  . Multiple Vitamins-Minerals (CENTRUM SILVER PO) Take 1 tablet by mouth daily.   . niacin (SLO-NIACIN) 500 MG tablet  Take 2,000 mg by mouth at bedtime. 4 BY MOUTH AT BEDTIME  . traMADol (ULTRAM) 50 MG tablet Take 1 tablet (50 mg total) by mouth every 8 (eight) hours as needed.    FAMILY HISTORY:  His reported the following about his mother: d of PNA.   SOCIAL HISTORY: He  reports that he quit smoking about 30 years ago. His smoking use included Cigarettes. He has never used smokeless tobacco. He reports that he does not drink alcohol or use illicit drugs.  REVIEW OF SYSTEMS:  POSITIVES in BOLD Gen: Denies fever, chills, weight change, fatigue, night sweats HEENT: Denies blurred vision, double vision, hearing loss, tinnitus, sinus congestion, rhinorrhea, sore throat, neck stiffness, dysphagia PULM: Denies shortness of breath, cough, sputum production, hemoptysis, wheezing CV: Denies chest pain, edema, orthopnea, paroxysmal nocturnal dyspnea, palpitations GI: Denies abdominal pain, nausea with dry heaving, vomiting, diarrhea, hematochezia, melena, constipation, change in bowel habits GU: Denies dysuria, hematuria, polyuria, oliguria, urethral discharge Endocrine: Denies hot or cold intolerance, polyuria, polyphagia or appetite change Derm: Denies rash, dry skin, scaling or peeling skin change.  LE cellulitis with multiple open blisters / sores Heme: Denies easy bruising, bleeding, bleeding gums Neuro: Denies headache, numbness, weakness, slurred speech, loss of memory or consciousness   SUBJECTIVE:   VITAL SIGNS: BP 103/58 mmHg  Pulse 110  Temp(Src) 102.4 F (39.1 C) (Rectal)  Resp 26  Ht 5\' 5"  (1.651 m)  Wt 258 lb (117.028 kg)  BMI 42.93 kg/m2  SpO2 97%  HEMODYNAMICS:    VENTILATOR SETTINGS:    INTAKE / OUTPUT:    PHYSICAL EXAMINATION: General:  Obese adult male lying on stretcher  Neuro:  Awakens to voice, oriented in conversation but drowsy, MAE HEENT:  MM pink/dry, no jvd  Cardiovascular:  s1s2 rrr, tachy Lungs:  Even/non-labored, lungs bilaterally distant but clear  Abdomen:   Obese/soft, bsx4 active  Musculoskeletal:  No acute deformities  Skin:  Warm/dry, BLE changes consistent with chronic venous stasis / cellulitis, multiple scattered small open lesions with weeping    Repeat sepsis assessment completed.   LABS:  BMET  Recent Labs Lab 05/24/16 1031 05/24/16 1042  NA 137 137  K 3.8 3.9  CL 101 99*  CO2 24  --   BUN 28* 25*  CREATININE 1.21 1.20  GLUCOSE 158* 153*    Electrolytes  Recent Labs Lab 05/24/16 1031  CALCIUM 8.7*    CBC  Recent Labs Lab 05/24/16 1031 05/24/16 1042  WBC 20.9*  --   HGB 12.1* 12.9*  HCT 35.7* 38.0*  PLT 176  --     Coag's  Recent Labs Lab 05/24/16 1031  INR 1.26    Sepsis Markers  Recent Labs Lab 05/24/16 1041 05/24/16 1346  LATICACIDVEN 4.35* 3.27*    ABG No results for input(s): PHART, PCO2ART, PO2ART in the last 168 hours.  Liver Enzymes  Recent Labs Lab 05/24/16 1031  AST 34  ALT 30  ALKPHOS 58  BILITOT 0.8  ALBUMIN 3.6    Cardiac Enzymes No results for input(s): TROPONINI, PROBNP in the last 168 hours.  Glucose  Recent Labs Lab 05/24/16 1027  GLUCAP 159*    Imaging Dg Chest Port 1 View  05/24/2016  CLINICAL DATA:  Shortness  of breath, fever and altered mental status for approximately 1-1/2 days. EXAM: PORTABLE CHEST 1 VIEW COMPARISON:  PA and lateral chest 05/13/2008. CT abdomen and pelvis 06/15/2015 FINDINGS: Innumerable sclerotic lesions are identified about the shoulders and in multiple ribs consistent with metastatic prostate carcinoma, new since the prior chest films. Bone metastases are seen on the prior CT Lung volumes are somewhat low. No pneumothorax or pleural effusion is seen. No consolidative process is identified. Heart size is normal. IMPRESSION: No acute cardiopulmonary disease. Innumerable sclerotic osseous lesions consistent with metastatic prostate carcinoma. Electronically Signed   By: Inge Rise M.D.   On: 05/24/2016 11:09     STUDIES:   CXR 7/11 >> multiple osseous lesions (apparently new to the patient)  CULTURES: BCx2 7/11 >>  UC 7/11 >>   ANTIBIOTICS: Vancomycin 7/11 >>  Zosyn 7/11 >>   SIGNIFICANT EVENTS: 7/11  Admit with sepsis, concern BLE cellulitis as source   LINES/TUBES:   DISCUSSION: 70 y/o M with PMH of DM, HTN, CAD s/p MI, prostate cancer admitted 7/11 with concern for sepsis.  Initial lactate 4, concern BLE cellulitis as source.  New radiographic evidence of osseous metastases on CXR.    ASSESSMENT / PLAN:  PULMONARY A: Acute Hypoxic Respiratory Failure - no infiltrate on CXR, ? atx / hypoventilation Former Smoker  P:   O2 as needed to support sats > 92% Trend CXR  Will need to further discuss osseous lesions with patient.and family.  May need ONC input  CARDIOVASCULAR A:  Sepsis  Hypotension - resolved with 2L IVF  Hx CAD s/p MI, HTN, HLD  P:  SDU monitoring  Hold home anti-hypertensive regimen Assess cortisol   RENAL A:   Elevated Lactic Acid - suspect in setting of sepsis, hypotension.  Also, consider malignancy.   P:   Trend BMP / UOP  Replace electrolytes as indicated   GASTROINTESTINAL A:   Nausea  Obesity  P:   Carb modified diet PRN zofran   HEMATOLOGIC A:   Mild Anemia - no evidence of bleeding  P:  Trend CBC Lovenox for DVT prophylaxis   INFECTIOUS A:   Sepsis - suspected skin / LE source  BLE Cellulitis  P:   Monitor cultures  ABX as above   ENDOCRINE A:   DM II    P:   SSI   NEUROLOGIC A:   Acute Encephalopathy - in setting of hypotension, fever, sepsis  P:   RASS goal: n/a Monitor mental status Supportive care   FAMILY  - Updates: Grandson and patient updated on plan of care.   - Inter-disciplinary family meet or Palliative Care meeting due by:  7/18.  Discussed possibility of intubation with patient / ACLS.  He is agreeable for short term support if needed for a reversible process.  However, would not want long term  support.     Noe Gens, NP-C [facility] Pgr: [phone] or if no answer [phone] 05/24/2016, 2:02 PM

## 2016-05-24 NOTE — ED Notes (Addendum)
PT aware of urine sample. PT state he would have to turn his switch on to use restroom

## 2016-05-25 ENCOUNTER — Inpatient Hospital Stay (HOSPITAL_COMMUNITY): Payer: Medicare Other

## 2016-05-25 DIAGNOSIS — A409 Streptococcal sepsis, unspecified: Principal | ICD-10-CM

## 2016-05-25 DIAGNOSIS — I214 Non-ST elevation (NSTEMI) myocardial infarction: Secondary | ICD-10-CM

## 2016-05-25 DIAGNOSIS — R0601 Orthopnea: Secondary | ICD-10-CM

## 2016-05-25 DIAGNOSIS — E1165 Type 2 diabetes mellitus with hyperglycemia: Secondary | ICD-10-CM

## 2016-05-25 DIAGNOSIS — I5031 Acute diastolic (congestive) heart failure: Secondary | ICD-10-CM

## 2016-05-25 DIAGNOSIS — I2510 Atherosclerotic heart disease of native coronary artery without angina pectoris: Secondary | ICD-10-CM

## 2016-05-25 DIAGNOSIS — R0600 Dyspnea, unspecified: Secondary | ICD-10-CM

## 2016-05-25 DIAGNOSIS — B954 Other streptococcus as the cause of diseases classified elsewhere: Secondary | ICD-10-CM

## 2016-05-25 DIAGNOSIS — I248 Other forms of acute ischemic heart disease: Secondary | ICD-10-CM

## 2016-05-25 DIAGNOSIS — R7881 Bacteremia: Secondary | ICD-10-CM

## 2016-05-25 DIAGNOSIS — J9601 Acute respiratory failure with hypoxia: Secondary | ICD-10-CM

## 2016-05-25 LAB — CBC
HCT: 31 % — ABNORMAL LOW (ref 39.0–52.0)
HEMOGLOBIN: 10.4 g/dL — AB (ref 13.0–17.0)
MCH: 31.9 pg (ref 26.0–34.0)
MCHC: 33.5 g/dL (ref 30.0–36.0)
MCV: 95.1 fL (ref 78.0–100.0)
PLATELETS: 136 10*3/uL — AB (ref 150–400)
RBC: 3.26 MIL/uL — AB (ref 4.22–5.81)
RDW: 15 % (ref 11.5–15.5)
WBC: 14.4 10*3/uL — ABNORMAL HIGH (ref 4.0–10.5)

## 2016-05-25 LAB — GLUCOSE, CAPILLARY
GLUCOSE-CAPILLARY: 88 mg/dL (ref 65–99)
GLUCOSE-CAPILLARY: 108 mg/dL — AB (ref 65–99)
Glucose-Capillary: 95 mg/dL (ref 65–99)
Glucose-Capillary: 103 mg/dL — ABNORMAL HIGH (ref 65–99)
Glucose-Capillary: 121 mg/dL — ABNORMAL HIGH (ref 65–99)

## 2016-05-25 LAB — TROPONIN I
TROPONIN I: 0.48 ng/mL — AB (ref <0.03)
TROPONIN I: 1.04 ng/mL — AB (ref <0.03)
Troponin I: 0.41 ng/mL (ref <0.03)
Troponin I: 0.45 ng/mL (ref <0.03)

## 2016-05-25 LAB — URINE CULTURE

## 2016-05-25 LAB — COMPREHENSIVE METABOLIC PANEL
ALK PHOS: 39 U/L (ref 38–126)
ALT: 22 U/L (ref 17–63)
AST: 26 U/L (ref 15–41)
Albumin: 2.8 g/dL — ABNORMAL LOW (ref 3.5–5.0)
Anion gap: 6 (ref 5–15)
BUN: 25 mg/dL — ABNORMAL HIGH (ref 6–20)
CALCIUM: 7.6 mg/dL — AB (ref 8.9–10.3)
CO2: 25 mmol/L (ref 22–32)
CREATININE: 1.15 mg/dL (ref 0.61–1.24)
Chloride: 107 mmol/L (ref 101–111)
Glucose, Bld: 107 mg/dL — ABNORMAL HIGH (ref 65–99)
Potassium: 3.9 mmol/L (ref 3.5–5.1)
Sodium: 138 mmol/L (ref 135–145)
Total Bilirubin: 0.5 mg/dL (ref 0.3–1.2)
Total Protein: 5.4 g/dL — ABNORMAL LOW (ref 6.5–8.1)

## 2016-05-25 LAB — BRAIN NATRIURETIC PEPTIDE: B Natriuretic Peptide: 214.3 pg/mL — ABNORMAL HIGH (ref 0.0–100.0)

## 2016-05-25 LAB — ECHOCARDIOGRAM COMPLETE
Height: 65 in
WEIGHTICAEL: 4225.78 [oz_av]

## 2016-05-25 MED ORDER — FUROSEMIDE 10 MG/ML IJ SOLN
20.0000 mg | Freq: Once | INTRAMUSCULAR | Status: AC
  Administered 2016-05-25: 20 mg via INTRAVENOUS
  Filled 2016-05-25: qty 2

## 2016-05-25 MED ORDER — PERFLUTREN LIPID MICROSPHERE
INTRAVENOUS | Status: AC
  Filled 2016-05-25: qty 10

## 2016-05-25 MED ORDER — PERFLUTREN LIPID MICROSPHERE
1.0000 mL | INTRAVENOUS | Status: AC | PRN
  Administered 2016-05-25: 2 mL via INTRAVENOUS
  Filled 2016-05-25: qty 10

## 2016-05-25 MED ORDER — FUROSEMIDE 10 MG/ML IJ SOLN
40.0000 mg | Freq: Two times a day (BID) | INTRAMUSCULAR | Status: DC
  Administered 2016-05-25 – 2016-05-26 (×2): 40 mg via INTRAVENOUS
  Filled 2016-05-25 (×2): qty 4

## 2016-05-25 MED ORDER — METOPROLOL TARTRATE 12.5 MG HALF TABLET
12.5000 mg | ORAL_TABLET | Freq: Two times a day (BID) | ORAL | Status: DC
  Administered 2016-05-25 – 2016-05-27 (×5): 12.5 mg via ORAL
  Filled 2016-05-25 (×5): qty 1

## 2016-05-25 MED ORDER — CETYLPYRIDINIUM CHLORIDE 0.05 % MT LIQD
7.0000 mL | Freq: Two times a day (BID) | OROMUCOSAL | Status: DC
  Administered 2016-05-25 – 2016-06-04 (×15): 7 mL via OROMUCOSAL

## 2016-05-25 NOTE — Clinical Documentation Improvement (Signed)
Internal Medicine  Can the diagnosis of obesity be further specified?  Thank you    Type - morbid obesity, obesity, overweight, with alveolar hypoventilation (Pickwickian Syndrome)  Etiology - drug induced (specify drug), due to excess calories, familial, endocrine  Other  Clinically Undetermined  Supporting Information: BMI 43.0,Type II DM with Hyperglycemia, CAD    Please exercise your independent, professional judgment when responding. A specific answer is not anticipated or expected.   Thank You,  [place] [phone]

## 2016-05-25 NOTE — Clinical Documentation Improvement (Signed)
Internal Medicine  Can the diagnosis of "heart failure" be further specified? Thank you     Acuity - Acute, Chronic, Acute on Chronic   Type - Systolic, Diastolic, Systolic and Diastolic  Other  Clinically Undetermined   Document any associated diagnoses/conditions   Supporting Information: Demand ischemia,  BNP 415.4    Treatment:  IV Lasix   Please exercise your independent, professional judgment when responding. A specific answer is not anticipated or expected.   Thank You,  [place] [phone]

## 2016-05-25 NOTE — Consult Note (Signed)
Patient ID: Theodore [name] MRN: [id number], DOB/AGE: 1946/03/23   Admit date: 05/24/2016   Reason for Consult: ? CHF, + troponin and TEE for bacteremia  Requesting MD: Dr. Eulas Post, Internal Medicine.    Primary Physician: [name], [place] Primary Cardiologist: Dr. [name] (last seen in 2012)  Pt. Profile:  70 y.o. male with medical history significant of metastatic prostate cancer, DM, CAD, PVD who presents with AMS, fever/ sepsis 2/2 bilateral LE cellulitis. He now has + positive blood cultures growing gram positive cocci, streptococcus species. Cardiology consulted for CHF, + troponin and evaluation for TEE.   Problem List  Past Medical History  Diagnosis Date  . PVD (peripheral vascular disease) ([place])   . 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 Hyperlipidemia   . Hypertension   . Diabetes mellitus     controlled  . Prostate cancer [facility])     prostate  . Paroxysmal nocturnal dyspnea   . Cellulitis 10/18-20/2012    RLE   . Diverticulitis 02/19/13  . CAD (coronary artery disease)   . Gallstones     Past Surgical History  Procedure Laterality Date  . Prostate surgery    . Penile prosthesis  removal      swich(sphincter)...removed 04-2008, replaced 02-2009  . Cholecystectomy    . Tonsillectomy    . Cervical disc surgery  1988     Allergies  Allergies  Allergen Reactions  . Sulfonamide Derivatives     "reaction" as infant according to his mother    HPI  70 y/o male, followed in the past by Dr. [name]. Last seen in 2012. 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 He also has a h/o metastatic prostate cancer, DM and PVD.    He presented to the [facility] ED on 05/24/16 with complaint of AMS, fever and worsening LE cellulitis, despite outpatient antibiotics. He also complained of dyspnea. In ED, he was noted to be septic with fever, hypoxemia, leukocytosis with WBC of 20 and a lactic acid level of 4.3. This was  felt 2/2 bilateral LE cellulitis. He ws admitted by IM and placed on IV vanc and zosyn. Blood cultures are positive, growing gram positive cocci, streptococcus species. 7/12, patient developed acute hypoxic respiratory failure. Troponin was subsequently checked and was abnormal at 0.45. He denies CP. STAT CXR shows no notable edema. BNP pending. Cardiology consulted for recommendations.    Home Medications  Prior to Admission medications   Medication Sig Start Date End Date Taking? Authorizing Provider  aspirin 325 MG tablet Take 325 mg by mouth every evening.    Yes Historical Provider, MD  clotrimazole-betamethasone (LOTRISONE) cream Apply 1 application topically 2 (two) times daily.   Yes Historical Provider, MD  doxazosin (CARDURA) 8 MG tablet Take 4 mg by mouth daily. 1/2 by mouth daily   Yes Historical Provider, MD  furosemide (LASIX) 20 MG tablet Take 1 tablet (20 mg total) by mouth 2 (two) times daily. 09/26/11  Yes Josue Hector, MD  Leuprolide Acetate (LUPRON IJ) Inject as directed. 1 injection every 4 months    Yes Historical Provider, MD  metFORMIN (GLUCOPHAGE) 500 MG tablet Take 500 mg by mouth daily.    Yes Historical Provider, MD  Multiple Vitamins-Minerals (CENTRUM SILVER PO) Take 1 tablet by mouth daily.    Yes Historical Provider, MD  doxycycline (VIBRA-TABS) 100 MG tablet Take 1 tablet (100 mg total) by mouth 2 (  two) times daily. 05/03/16   [place], FNP  losartan (COZAAR) 100 MG tablet Take 50 mg by mouth daily. 1/2 BY MOUTH DAILY    Historical Provider, MD  metoprolol (LOPRESSOR) 100 MG tablet Take 100 mg by mouth 2 (two) times daily.    Historical Provider, MD    Family History  Family History  Problem Relation Age of Onset  . Seizures Father   . Gout Brother   . Kidney disease Brother     kidney failure   . Stroke Father   . Pneumonia Mother   . Diabetes Father   . Diabetes Brother   . Alcoholism Mother   . Heart disease Father     Social  History  Social History   Social History  . Marital Status: Married    Spouse Name: N/A  . Number of Children: 2  . Years of Education: N/A   Occupational History  . retired    Social History Main Topics  . Smoking status: Former Smoker    Types: Cigarettes    Quit date: 05/17/1986  . Smokeless tobacco: Never Used  . Alcohol Use: No  . Drug Use: No  . Sexual Activity: Not on file   Other Topics Concern  . Not on file   Social History Narrative   Married and retired. He is a English as a second language teacher. He gets some care from the [place]. 2 sons. 2 caffeinated drinks a day.     Review of Systems General:  No chills, fever, night sweats or weight changes.  Cardiovascular:  No chest pain, dyspnea on exertion, edema, orthopnea, palpitations, paroxysmal nocturnal dyspnea. Dermatological: No rash, lesions/masses Respiratory: No cough, dyspnea Urologic: No hematuria, dysuria Abdominal:   No nausea, vomiting, diarrhea, bright red blood per rectum, melena, or hematemesis Neurologic:  No visual changes, wkns, changes in mental status. All other systems reviewed and are otherwise negative except as noted above.  Physical Exam  Blood pressure 120/50, pulse 110, temperature 99.2 F (37.3 C), temperature source Oral, resp. rate 26, height 5\' 5"  (1.651 m), weight 264 lb 1.8 oz (119.8 kg), SpO2 100 %.  General: Pleasant, NAD, obese and edematous  Psych: Normal affect. Neuro: Alert and oriented X 3. Moves all extremities spontaneously. HEENT: Normal  Neck: Supple without bruits or JVD. Lungs:  Resp regular and unlabored, decreased BS at the bases  Heart: RRR no s3, s4, or murmurs. Abdomen: Soft, non-tender, non-distended, BS + x 4.  Extremities: upper extremities are edematous, bilateral LEs wrapped. DP/PT/Radials 2+ and equal bilaterally.  Labs  Troponin [facility] Test)  Recent Labs  05/24/16 1038  TROPIPOC 0.01    Recent Labs  05/24/16 1445 05/24/16 1855 05/25/16 0059 05/25/16 0655   TROPONINI 0.06* 0.11* 0.41* 0.45*   Lab Results  Component Value Date   WBC 14.4* 05/25/2016   HGB 10.4* 05/25/2016   HCT 31.0* 05/25/2016   MCV 95.1 05/25/2016   PLT 136* 05/25/2016    Recent Labs Lab 05/25/16 0059  NA 138  K 3.9  CL 107  CO2 25  BUN 25*  CREATININE 1.15  CALCIUM 7.6*  PROT 5.4*  BILITOT 0.5  ALKPHOS 39  ALT 22  AST 26  GLUCOSE 107*   Lab Results  Component Value Date   CHOL 130 05/06/2016   HDL 62 05/06/2016   LDLCALC 46 05/06/2016   TRIG 111 05/06/2016   No results found for: [facility]   Radiology/Studies  Dg Chest Port 1 View  05/25/2016  CLINICAL DATA:  Dyspnea EXAM: PORTABLE CHEST 1 VIEW COMPARISON:  05/24/2016 FINDINGS: Cardiac shadow is stable. The lungs are well aerated bilaterally. There again noted sclerotic metastatic lesions consistent with the patient's given clinical history. Some suggestion of nodularity within the left lung is noted. This may be related to overlying bony tissues but a few of the somewhat nodular areas appear to be extrinsic to the bone. Noncontrast CT may be helpful for further evaluation. Increased fullness in the right medial lung base is noted likely related to early infiltrate. IMPRESSION: Changes consistent with sclerotic bone mets. Slight nodularity in the left lung which may be extrinsic to the bony structures. Additionally some increased density in the right medial lung base is noted. CT may be helpful for further evaluation. Electronically Signed   By: Inez Catalina M.D.   On: 05/25/2016 08:56   Dg Chest Port 1 View  05/24/2016  CLINICAL DATA:  Shortness of breath, fever and altered mental status for approximately 1-1/2 days. EXAM: PORTABLE CHEST 1 VIEW COMPARISON:  PA and lateral chest 05/13/2008. CT abdomen and pelvis 06/15/2015 FINDINGS: Innumerable sclerotic lesions are identified about the shoulders and in multiple ribs consistent with metastatic prostate carcinoma, new since the prior chest films. Bone  metastases are seen on the prior CT Lung volumes are somewhat low. No pneumothorax or pleural effusion is seen. No consolidative process is identified. Heart size is normal. IMPRESSION: No acute cardiopulmonary disease. Innumerable sclerotic osseous lesions consistent with metastatic prostate carcinoma. Electronically Signed   By: Inge Rise M.D.   On: 05/24/2016 11:09    ECG  Sinus tach with PACs  Echocardiogram- pending  ASSESSMENT AND PLAN  Principal Problem:   Sepsis ([place]) Active Problems:   Type 2 diabetes mellitus with hyperglycemia (HCC)   Essential hypertension   CAD (coronary artery disease)   Acute respiratory failure with hypoxia ([place])   1. Sepsis:  2/2 to bacteremia. Management Per IM.   2. Bacteremia: 2/2 bilateral LE cellulitis. Blood cultures + for gram positive cocci, streptococcus species. Antibiotics per IM. He will likely need a TEE to r/o endocarditis.   3. ? Acute CHF: patient developed acute respiratory hypoxic failure, however STAT CXR w/o notable pulmonary edema. However he has significant peripheral edema. BNP and 2D echo pending. He just received a dose of IV lasix. Renal function is normal. Recommend BID dosing. Monitor I/Os, BP, renal function and electrolytes.   4. Elevated Troponin: 0.11>0.41>>0.45. Likely secondary to demand ischemia in the setting of sepsis/ bacteremia. He does however have a distant history of silent inferior MI with chronic occlusion of his RCA. He is not currently a cath candidate given his acute illness. Can consider further ischemic w/u once he recovers from his sepsis. He denies CP.   Signed, Lyda Jester, PA-C 05/25/2016, 9:45 AM

## 2016-05-25 NOTE — Progress Notes (Signed)
RN called troponin of .4 up from .11 on admission. Pt has NOT had any chest pain. Likely, demand ischemia in setting of sepsis. Continue to follow troponins.  KJKG, NP [facility]

## 2016-05-25 NOTE — Progress Notes (Signed)
CRITICAL VALUE ALERT  Critical value received:  Troponin 0.41  Date of notification:  05/25/2016   Time of notification:  0200  Critical value read back:Yes.    Nurse who received alert:  Jeanie Sewer   MD notified (1st page):  Tylene Fantasia NP  Time of first page:  0200  MD notified (2nd page):  Time of second page:  Responding MD:  [id number]  Time MD responded:

## 2016-05-25 NOTE — Progress Notes (Signed)
  Echocardiogram 2D Echocardiogram with Defnity has been performed.  Darlina Sicilian M 05/25/2016, 11:57 AM

## 2016-05-25 NOTE — Progress Notes (Signed)
PROGRESS NOTE    Theodore [name]  [id number] DOB: 06/25/1946 DOA: 05/24/2016   PCP: [name], FNP    Brief Narrative: 70 yo gentleman admitted to the stepdown unit for management of sepsis secondary to bilateral lower extremity cellulitis.  Lactic acid 4, WBC 20 at time of admission.  Procalcitonin 2.68.  He has positive blood cultures, gram positive cocci, streptococcus species Troponin 0.45 WBC count down to 14.4 Hgb down to 10.4 Lactic acid level improved to 2.6  Assessment & Plan:   Principal Problem:   Sepsis ([place]) Active Problems:   Type 2 diabetes mellitus with hyperglycemia (HCC)   Essential hypertension   CAD (coronary artery disease)   Acute respiratory failure with hypoxia (HCC) History of metastatic prostate cancer PVD   1-Sepsis, BACTEREMIA; in setting of Cellulitis bilateral lower extremities.  It appears that he now has positive blood cultures 2 of 2 from admission, strep species Will reduce the rate of IV fluids now due to shortness of breath, will give lasix 20mg  IV one time If it appears that he is manifesting septic shock (refractory blood pressures after adequate volume resuscitation, he may need pressor support and/or stress dose steroids).  Will need PCCM assistance. IV vancomycn and Zosyn, continue current antibiotics for now.  He is still very sick.  Lactic acid level improved Wound care assisting with management of lower extremity wounds Cardiology consulted, he may ultimately need TEE in the setting of his bacteremia.  Repeat cultures ordered for later today.  TTE ordered for this morning.  2-HTN; relative hypotension; continue to hold home medications for now  3-Cellulitis,Bilateral Extremities.  IV vancomycin and Zosyn.  Wound care follwoing  4-DM; Hold metformin. SSI.  5-CAD; Continue with aspirin.   6-Acute hypoxic Respiratory failure; short of breath this AM with mildly elevated troponin suggestive of demand ischemia in the  setting of sepsis.  However, he also gives a prior history of orthopnea/PND. Continue O2 supplementation STAT chest xray, BNP, echo Reduce NS rate now and give IV lasix 20mg  IV one time now, monitor clinical response Cardiology consult requested  7-Prostate cancer; follow with urology 8-Acute encephalopathy; RESOLVED   DVT prophylaxis: Lovenox Code Status: FULL CODE Family Communication: Patient alone today during rounds Disposition Plan: Not ready for transfer to the floor yet.  Expect he will be in the hospital at least another 3-4 days.   Consultants:   Critical Care  Wound care  Procedures:   NONE  Antimicrobials:  Vanc, Zosyn 7/11   Subjective: Complaining of shortness of breath.  Denies active chest pain but reports history of "not lying flat" and "never sleeping in his bed at home" secondary to what sounds like orthopnea/PND.  Followed by Dr. [name] but tells me he hasn't seen him in two years.  He is still febrile and still having chills.  O2 sats 94% on RA but patient asked to have his oxygen put back on.  Relative hypotension overnight.  Systolic [id number] at the bedside this AM.  IV fluids running at 100.  Objective: Filed Vitals:   05/25/16 0100 05/25/16 0400 05/25/16 0500 05/25/16 0600  BP: 103/48 97/39 126/50 120/50  Pulse: 112 94 110 110  Temp:  98.7 F (37.1 C)    TempSrc:  Oral    Resp: 23 19 27 26   Height:      Weight:      SpO2: 98% 98% 96% 100%    Intake/Output Summary (Last 24 hours) at 05/25/16 0740 Last data filed at  05/25/16 0500  Gross per 24 hour  Intake 328.33 ml  Output    600 ml  Net -271.67 ml   Filed Weights   05/24/16 1036 05/24/16 1500  Weight: 117.028 kg (258 lb) 119.8 kg (264 lb 1.8 oz)    Examination:  General exam: No longer confused.  Alert and oriented x 4 but appears uncomfortable. Respiratory system: Diminished bilaterally, he is mildly tachypneic Cardiovascular system: Mildly tachycardic but regular, bilateral  lower extremities are dressed but he is developing generalized edema.  Palpable DP pulses bilaterally. Gastrointestinal system: Abdomen is nondistended, soft and nontender. Normal bowel sounds heard. Central nervous system: Alert and oriented. No focal neurological deficits. Extremities: Skin: Pale, cool, not clammy.  Leg dressings were not removed at this time.   Psychiatry: Judgement and insight appear normal. Mood & affect appropriate.     Data Reviewed: I have personally reviewed following labs and imaging studies  CBC:  Recent Labs Lab 05/24/16 1031 05/24/16 1042 05/25/16 0059  WBC 20.9*  --  14.4*  NEUTROABS 17.9*  --   --   HGB 12.1* 12.9* 10.4*  HCT 35.7* 38.0* 31.0*  MCV 94.2  --  95.1  PLT 176  --  [id number]*   Basic Metabolic Panel:  Recent Labs Lab 05/24/16 1031 05/24/16 1042 05/25/16 0059  NA 137 137 138  K 3.8 3.9 3.9  CL 101 99* 107  CO2 24  --  25  GLUCOSE 158* 153* 107*  BUN 28* 25* 25*  CREATININE 1.21 1.20 1.15  CALCIUM 8.7*  --  7.6*   GFR: Estimated Creatinine Clearance: 70.7 mL/min (by C-G formula based on Cr of 1.15). Liver Function Tests:  Recent Labs Lab 05/24/16 1031 05/25/16 0059  AST 34 26  ALT 30 22  ALKPHOS 58 39  BILITOT 0.8 0.5  PROT 6.8 5.4*  ALBUMIN 3.6 2.8*   Coagulation Profile:  Recent Labs Lab 05/24/16 1031 05/24/16 1445  INR 1.26 1.38   Cardiac Enzymes:  Recent Labs Lab 05/24/16 1445 05/24/16 1855 05/25/16 0059 05/25/16 0655  TROPONINI 0.06* 0.11* 0.41* 0.45*   CBG:  Recent Labs Lab 05/24/16 1027 05/24/16 1630 05/24/16 1944 05/24/16 2309 05/25/16 0403  GLUCAP 159* 147* 121* 98 95   Sepsis Labs:  Recent Labs Lab 05/24/16 1041 05/24/16 1346 05/24/16 1445 05/24/16 1855  PROCALCITON  --   --  2.68  --   LATICACIDVEN 4.35* 3.27* 2.5* 2.6*    Recent Results (from the past 240 hour(s))  Blood Culture (routine x 2)     Status: None (Preliminary result)   Collection Time: 05/24/16 10:31 AM    Result Value Ref Range Status   Specimen Description BLOOD LEFT ANTECUBITAL  Final   Special Requests BOTTLES DRAWN AEROBIC AND ANAEROBIC 5ML  Final   Culture  Setup Time   Final    GRAM POSITIVE COCCI IN CLUSTERS IN PAIRS IN CHAINS IN BOTH AEROBIC AND ANAEROBIC BOTTLES CRITICAL RESULT CALLED TO, READ BACK BY AND VERIFIED WITH: B GREEN PHARMD [id number] 05/24/16 A BROWNING    Culture   Final    NO GROWTH < 12 HOURS Performed at [facility]    Report Status PENDING  Incomplete  Blood Culture (routine x 2)     Status: None (Preliminary result)   Collection Time: 05/24/16 10:42 AM  Result Value Ref Range Status   Specimen Description BLOOD LEFT WRIST  Final   Special Requests BOTTLES DRAWN AEROBIC AND ANAEROBIC 5ML  Final   Culture  Setup Time   Final    GRAM POSITIVE COCCI IN CLUSTERS IN PAIRS IN CHAINS IN BOTH AEROBIC AND ANAEROBIC BOTTLES Organism ID to follow CRITICAL RESULT CALLED TO, READ BACK BY AND VERIFIED WITH: B GREEN PHARMD [id number] 05/24/16 A BROWNING    Culture   Final    NO GROWTH < 12 HOURS Performed at [facility]    Report Status PENDING  Incomplete  Blood Culture ID Panel (Reflexed)     Status: Abnormal   Collection Time: 05/24/16 10:42 AM  Result Value Ref Range Status   Enterococcus species NOT DETECTED NOT DETECTED Final   Vancomycin resistance NOT DETECTED NOT DETECTED Final   Listeria monocytogenes NOT DETECTED NOT DETECTED Final   Staphylococcus species NOT DETECTED NOT DETECTED Final   Staphylococcus aureus NOT DETECTED NOT DETECTED Final   Methicillin resistance NOT DETECTED NOT DETECTED Final   Streptococcus species DETECTED (A) NOT DETECTED Final    Comment: CRITICAL RESULT CALLED TO, READ BACK BY AND VERIFIED WITH: B GREEN PHARMD [id number] 05/24/16 A BROWNING    Streptococcus agalactiae DETECTED (A) NOT DETECTED Final    Comment: CRITICAL RESULT CALLED TO, READ BACK BY AND VERIFIED WITH: B GREEN PHARMD 2335 05/24/16 A BROWNING    Streptococcus  pneumoniae NOT DETECTED NOT DETECTED Final   Streptococcus pyogenes NOT DETECTED NOT DETECTED Final   Acinetobacter baumannii NOT DETECTED NOT DETECTED Final   Enterobacteriaceae species NOT DETECTED NOT DETECTED Final   Enterobacter cloacae complex NOT DETECTED NOT DETECTED Final   Escherichia coli NOT DETECTED NOT DETECTED Final   Klebsiella oxytoca NOT DETECTED NOT DETECTED Final   Klebsiella pneumoniae NOT DETECTED NOT DETECTED Final   Proteus species NOT DETECTED NOT DETECTED Final   Serratia marcescens NOT DETECTED NOT DETECTED Final   Carbapenem resistance NOT DETECTED NOT DETECTED Final   Haemophilus influenzae NOT DETECTED NOT DETECTED Final   Neisseria meningitidis NOT DETECTED NOT DETECTED Final   Pseudomonas aeruginosa NOT DETECTED NOT DETECTED Final   Candida albicans NOT DETECTED NOT DETECTED Final   Candida glabrata NOT DETECTED NOT DETECTED Final   Candida krusei NOT DETECTED NOT DETECTED Final   Candida parapsilosis NOT DETECTED NOT DETECTED Final   Candida tropicalis NOT DETECTED NOT DETECTED Final    Comment: Performed at [facility]  MRSA PCR Screening     Status: None   Collection Time: 05/24/16  2:10 PM  Result Value Ref Range Status   MRSA by PCR NEGATIVE NEGATIVE Final    Comment:        The GeneXpert MRSA Assay (FDA approved for NASAL specimens only), is one component of a comprehensive MRSA colonization surveillance program. It is not intended to diagnose MRSA infection nor to guide or monitor treatment for MRSA infections.          Radiology Studies: Dg Chest Port 1 View  05/24/2016  CLINICAL DATA:  Shortness of breath, fever and altered mental status for approximately 1-1/2 days. EXAM: PORTABLE CHEST 1 VIEW COMPARISON:  PA and lateral chest 05/13/2008. CT abdomen and pelvis 06/15/2015 FINDINGS: Innumerable sclerotic lesions are identified about the shoulders and in multiple ribs consistent with metastatic prostate carcinoma, new since the  prior chest films. Bone metastases are seen on the prior CT Lung volumes are somewhat low. No pneumothorax or pleural effusion is seen. No consolidative process is identified. Heart size is normal. IMPRESSION: No acute cardiopulmonary disease. Innumerable sclerotic osseous lesions consistent with metastatic prostate carcinoma. Electronically Signed   By: Marcello Moores  Dalessio M.D.   On: 05/24/2016 11:09        Scheduled Meds: . aspirin  325 mg Oral QPM  . enoxaparin (LOVENOX) injection  40 mg Subcutaneous Q24H  . insulin aspart  0-15 Units Subcutaneous TID WC  . piperacillin-tazobactam (ZOSYN)  IV  3.375 g Intravenous Q8H  . vancomycin  1,250 mg Intravenous Q24H   Continuous Infusions: . sodium chloride 100 mL/hr at 05/24/16 1513     LOS: 1 day    Time spent: 40 minutes    Eber Jones, MD [facility] Pager [phone]  If 7PM-7AM, please contact night-coverage [email or web address] Password [facility] 05/25/2016, 7:40 AM

## 2016-05-26 ENCOUNTER — Inpatient Hospital Stay (HOSPITAL_COMMUNITY): Payer: Medicare Other

## 2016-05-26 DIAGNOSIS — L03115 Cellulitis of right lower limb: Secondary | ICD-10-CM

## 2016-05-26 DIAGNOSIS — A401 Sepsis due to streptococcus, group B: Secondary | ICD-10-CM

## 2016-05-26 DIAGNOSIS — L03119 Cellulitis of unspecified part of limb: Secondary | ICD-10-CM

## 2016-05-26 DIAGNOSIS — R0600 Dyspnea, unspecified: Secondary | ICD-10-CM | POA: Diagnosis present

## 2016-05-26 LAB — GLUCOSE, CAPILLARY
GLUCOSE-CAPILLARY: 122 mg/dL — AB (ref 65–99)
GLUCOSE-CAPILLARY: 152 mg/dL — AB (ref 65–99)
GLUCOSE-CAPILLARY: 140 mg/dL — AB (ref 65–99)
Glucose-Capillary: 140 mg/dL — ABNORMAL HIGH (ref 65–99)

## 2016-05-26 LAB — CBC WITH DIFFERENTIAL/PLATELET
Basophils Absolute: 0 10*3/uL (ref 0.0–0.1)
Basophils Relative: 0 %
EOS PCT: 0 %
Eosinophils Absolute: 0 10*3/uL (ref 0.0–0.7)
HEMATOCRIT: 32.7 % — AB (ref 39.0–52.0)
Hemoglobin: 10.8 g/dL — ABNORMAL LOW (ref 13.0–17.0)
LYMPHS ABS: 1 10*3/uL (ref 0.7–4.0)
LYMPHS PCT: 9 %
MCH: 31.7 pg (ref 26.0–34.0)
MCHC: 33 g/dL (ref 30.0–36.0)
MCV: 95.9 fL (ref 78.0–100.0)
MONO ABS: 0.8 10*3/uL (ref 0.1–1.0)
Monocytes Relative: 6 %
Neutro Abs: 10.1 10*3/uL — ABNORMAL HIGH (ref 1.7–7.7)
Neutrophils Relative %: 85 %
PLATELETS: 158 10*3/uL (ref 150–400)
RBC: 3.41 MIL/uL — ABNORMAL LOW (ref 4.22–5.81)
RDW: 14.9 % (ref 11.5–15.5)
WBC: 11.9 10*3/uL — ABNORMAL HIGH (ref 4.0–10.5)

## 2016-05-26 LAB — BASIC METABOLIC PANEL
Anion gap: 8 (ref 5–15)
BUN: 19 mg/dL (ref 6–20)
CO2: 27 mmol/L (ref 22–32)
CREATININE: 1.09 mg/dL (ref 0.61–1.24)
Calcium: 8 mg/dL — ABNORMAL LOW (ref 8.9–10.3)
Chloride: 102 mmol/L (ref 101–111)
GFR calc Af Amer: >60 mL/min (ref >60)
GFR calc non Af Amer: >60 mL/min (ref >60)
Glucose, Bld: 129 mg/dL — ABNORMAL HIGH (ref 65–99)
POTASSIUM: 3.8 mmol/L (ref 3.5–5.1)
Sodium: 137 mmol/L (ref 135–145)

## 2016-05-26 MED ORDER — FUROSEMIDE 10 MG/ML IJ SOLN
40.0000 mg | Freq: Three times a day (TID) | INTRAMUSCULAR | Status: DC
  Administered 2016-05-26 (×2): 40 mg via INTRAVENOUS
  Filled 2016-05-26 (×2): qty 4

## 2016-05-26 NOTE — Progress Notes (Signed)
Patient Profile: 70 y.o. male with medical history significant of metastatic prostate cancer, DM, CAD, PVD who presents with AMS, fever/ sepsis 2/2 bilateral LE cellulitis. He now has + positive blood cultures growing gram positive cocci, streptococcus species. Cardiology consulted for CHF, + troponin and evaluation for TEE.   Subjective: He feels his breathing has improved some. He denies any CP.   Objective: Vital signs in last 24 hours: Temp:  [98.4 F (36.9 C)-100.3 F (37.9 C)] 98.4 F (36.9 C) (07/13 0800) Pulse Rate:  [100-132] 117 (07/13 0800) Resp:  [21-39] 22 (07/13 0700) BP: (117-171)/(53-151) 148/63 mmHg (07/13 0800) SpO2:  [88 %-98 %] 97 % (07/13 0800) Weight:  [261 lb 14.5 oz (118.8 kg)] 261 lb 14.5 oz (118.8 kg) (07/13 0600) Last BM Date: 05/25/16  Intake/Output from previous day: 07/12 0701 - 07/13 0700 In: 1168.3 [P.O.:240; I.V.:418.3; IV Piggyback:450] Out: [id number] [Urine:1475] Intake/Output this shift: Total I/O In: 250 [IV Piggyback:250] Out: 300 [Urine:300]  Medications Current Facility-Administered Medications  Medication Dose Route Frequency Provider Last Rate Last Dose  . acetaminophen (TYLENOL) tablet 650 mg  650 mg Oral Q6H PRN Belkys A Regalado, MD      . antiseptic oral rinse (CPC / CETYLPYRIDINIUM CHLORIDE 0.05%) solution 7 mL  7 mL Mouth Rinse BID Lily Kocher, MD   7 mL at 05/25/16 2117  . aspirin tablet 325 mg  325 mg Oral QPM Belkys A Regalado, MD   325 mg at 05/25/16 0000  . enoxaparin (LOVENOX) injection 40 mg  40 mg Subcutaneous Q24H Belkys A Regalado, MD   40 mg at 05/25/16 1833  . furosemide (LASIX) injection 40 mg  40 mg Intravenous BID Pixie Casino, MD   40 mg at 05/26/16 0801  . insulin aspart (novoLOG) injection 0-15 Units  0-15 Units Subcutaneous TID WC Elmarie Shiley, MD   2 Units at 05/26/16 0802  . metoprolol tartrate (LOPRESSOR) tablet 12.5 mg  12.5 mg Oral BID Lily Kocher, MD   12.5 mg at 05/25/16 2117  . ondansetron  (ZOFRAN) injection 4 mg  4 mg Intravenous Q6H PRN Belkys A Regalado, MD   4 mg at 05/25/16 1842  . piperacillin-tazobactam (ZOSYN) IVPB 3.375 g  3.375 g Intravenous Q8H Donald Prose Runyon, RPH   3.375 g at 05/26/16 0100  . vancomycin (VANCOCIN) 1,250 mg in sodium chloride 0.9 % 250 mL IVPB  1,250 mg Intravenous Q24H Donald Prose Runyon, RPH   1,250 mg at 05/26/16 0802    PE: Blood pressure 120/50, pulse 110, temperature 99.2 F (37.3 C), temperature source Oral, resp. rate 26, height 5\' 5"  (1.651 m), weight 264 lb 1.8 oz (119.8 kg), SpO2 100 %.  General: Pleasant, NAD, obese and edematous  Psych: Normal affect. Neuro: Alert and oriented X 3. Moves all extremities spontaneously. HEENT: Normal Neck: Supple without bruits or JVD. Lungs: Resp regular and unlabored, decreased BS at the bases  Heart: RRR no s3, s4, or murmurs. Abdomen: Soft, non-tender, non-distended, BS + x 4.  Extremities: upper extremities are edematous, bilateral LEs wrapped. DP/PT/Radials 2+ and equal bilaterally.  Lab Results:   Recent Labs  05/24/16 1031 05/24/16 1042 05/25/16 0059 05/26/16 0334  WBC 20.9*  --  14.4* 11.9*  HGB 12.1* 12.9* 10.4* 10.8*  HCT 35.7* 38.0* 31.0* 32.7*  PLT 176  --  136* 158   BMET  Recent Labs  05/24/16 1031 05/24/16 1042 05/25/16 0059 05/26/16 0334  NA 137 137 138 137  K 3.8 3.9  3.9 3.8  CL 101 99* 107 102  CO2 24  --  25 27  GLUCOSE 158* 153* 107* 129*  BUN 28* 25* 25* 19  CREATININE 1.21 1.20 1.15 1.09  CALCIUM 8.7*  --  7.6* 8.0*   PT/INR  Recent Labs  05/24/16 1031 05/24/16 1445  LABPROT 15.5* 16.5*  INR 1.26 1.38   Cardiac Panel (last 3 results)  Recent Labs  05/25/16 0655 05/25/16 1246 05/25/16 1958  TROPONINI 0.45* 0.48* 1.04*    Studies/Results: 2D Echo 05/25/16 Study Conclusions  - Left ventricle: The cavity size was normal. There was moderate  focal basal hypertrophy of the septum. Systolic function was  vigorous. The estimated  ejection fraction was in the range of 65%  to 70%. Wall motion was normal; there were no regional wall  motion abnormalities. Features are consistent with a pseudonormal  left ventricular filling pattern, with concomitant abnormal  relaxation and increased filling pressure (grade 2 diastolic  dysfunction). Doppler parameters are consistent with elevated  ventricular end-diastolic filling pressure. - Aortic root: The aortic root was normal in size. - Mitral valve: Calcified annulus. Mildly thickened leaflets . - Left atrium: The atrium was mildly dilated. - Right atrium: The atrium was normal in size. - Tricuspid valve: There was mild regurgitation. - Pulmonary arteries: Systolic pressure was within the normal  range. - Inferior vena cava: The vessel was normal in size. - Pericardium, extracardiac: There was no pericardial effusion.  Impressions:  - limited study quality secondary to tachycardia during the  acquisition.   Assessment/Plan  Principal Problem:   Sepsis ([place]) Active Problems:   Type 2 diabetes mellitus with hyperglycemia (HCC)   Essential hypertension   CAD (coronary artery disease)   Morbid obesity ([place])   Acute respiratory failure with hypoxia (HCC)   Acute diastolic (congestive) heart failure ([place])  1. Sepsis: 2/2 to bacteremia. Management Per IM.   2. Bacteremia: 2/2 bilateral LE cellulitis. Blood cultures + for gram positive cocci, streptococcus species. Antibiotics per IM. No significant valvular abnormalities nor vegetations noted on 2D echo, however he may still need a TEE to better assess cardiac valves to fully r/o endocarditis. We will need to adequately diurese him first as he will need to be able to tolerate supine position for test (has had orthopnea).   3. Acute Diastolic CHF: patient developed acute respiratory hypoxic failure, however STAT CXR w/o notable pulmonary edema. However he has significant peripheral edema/ anasarca. BNP only  214.  2D Echo yesterday demonstrated vigorous LV systolic function with an EF of 65-70%. Grade 2DD was noted. He is responding well to IV Lasix. -1.4L out yesterday. Renal function, K and BP all stable. Continue IV diuresis. It was recommended to increase frequency from BID to TID, but this was never ordered. Will increase to 40 mg IV TID today. Monitor I/Os, BP, renal function and electrolytes.   4. Elevated Troponin: 0.11>0.41>>0.45>>0.48>>1.04. This may be secondary to demand ischemia in the setting of sepsis/ bacteremia. However he did have a big jump in troponin level from 0.48 to 1.04. ? True type I NSTEMI. 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 He denies any recent CP. He will need repeat ischemic eval at some point. MD to advise on timing of this.     LOS: 2 days    Shailynn Fong M. Ladoris Gene 05/26/2016 9:49 AM

## 2016-05-26 NOTE — Progress Notes (Signed)
PROGRESS NOTE    Theodore [name]  [id number] DOB: 1946/10/17 DOA: 05/24/2016 PCP: [name], FNP   Brief Narrative: 70 yo gentleman admitted to the stepdown unit for management of sepsis secondary to bilateral lower extremity cellulitis. Lactic acid 4, WBC 20 at time of admission. Procalcitonin 2.68. Positive for grup B strept bacteremia  Assessment & Plan:   Principal Problem:   Sepsis ([employer]) Active Problems:   Type 2 diabetes mellitus with hyperglycemia (HCC)   Essential hypertension   CAD (coronary artery disease)   Morbid obesity ([place])   Acute respiratory failure with hypoxia (HCC)   Acute diastolic (congestive) heart failure ([place])   1. Cardiovascular. Sepsis.  Due to bilateral lower extremities, cellulitis, will continue broad spectrum antibiotic therapy with vancomycin and zosyn. Cultures positive for gram positive bacteremia, TTE with normal LV function with no reported vegetation. Noted diastolic heart failure probably chronic. Continue metoprolol.   2. Pulmonary. Pulmonary edema. Chest film personally reviewed noted increase interstitial infiltrates, will continue diuresis with furosemide, to target negative fluid, balance, continue oxymetry monitor and oxygen supplementation per [place]  3. Nephrology. Renal function with stable cr at 1.09, will follow on renal panel in am, avoid hypotension or nephrotoxic medications. K at 3.8.   4, Skin. Bilateral cellulitis, will continue local wound care, pain control.   Patient at moderate risk for worsening sepsis.   DVT prophylaxis: Lovenox Code Status: FULL CODE Family Communication: I spoke with patient's family at the bedside today and all questions were addressed, key information for patient's care was obtained.  Disposition Plan:   Consultants:    Procedures:   Antimicrobials:  Vancomycin Zosyn  Subjective: This am patient feeling dyspnea, no chest pain, no nausea or vomiting. Poor appetite. Dyspnea moderate  in intensity, no worsening or improving factors, persistent, no associated chest pain.   Objective: Filed Vitals:   05/26/16 0400 05/26/16 0401 05/26/16 0500 05/26/16 0600  BP: 155/65  152/72 145/63  Pulse: 120  116 118  Temp:  99.3 F (37.4 C)    TempSrc:  Oral    Resp: 31  23 32  Height:      Weight:    118.8 kg (261 lb 14.5 oz)  SpO2: 94%  95% 95%    Intake/Output Summary (Last 24 hours) at 05/26/16 0745 Last data filed at 05/26/16 0300  Gross per 24 hour  Intake 1168.33 ml  Output   1475 ml  Net -306.67 ml   Filed Weights   05/24/16 1036 05/24/16 1500 05/26/16 0600  Weight: 117.028 kg (258 lb) 119.8 kg (264 lb 1.8 oz) 118.8 kg (261 lb 14.5 oz)    Examination:  General exam: deconditioned and ill looking appearing. E ENT: mild conjunctival pallor, oral mucosa dry. Respiratory system: decreased breath sounds at bases with mild expiratory wheezing, no significant rales or rhonchi. Cardiovascular system: S1 & S2 heard, RRR. No JVD, murmurs, rubs, gallops or clicks. No pedal edema. Tachycardic. Gastrointestinal system: Abdomen is nondistended, soft and nontender. No organomegaly or masses felt. Normal bowel sounds heard. Central nervous system: Alert and oriented. No focal neurological deficits. Extremities: Symmetric 5 x 5 power. Skin: No rashes, lesions or ulcers   Data Reviewed: I have personally reviewed following labs and imaging studies  CBC:  Recent Labs Lab 05/24/16 1031 05/24/16 1042 05/25/16 0059 05/26/16 0334  WBC 20.9*  --  14.4* 11.9*  NEUTROABS 17.9*  --   --  10.1*  HGB 12.1* 12.9* 10.4* 10.8*  HCT 35.7* 38.0* 31.0*  32.7*  MCV 94.2  --  95.1 95.9  PLT 176  --  136* 0000000   Basic Metabolic Panel:  Recent Labs Lab 05/24/16 1031 05/24/16 1042 05/25/16 0059 05/26/16 0334  NA 137 137 138 137  K 3.8 3.9 3.9 3.8  CL 101 99* 107 102  CO2 24  --  25 27  GLUCOSE 158* 153* 107* 129*  BUN 28* 25* 25* 19  CREATININE 1.21 1.20 1.15 1.09  CALCIUM  8.7*  --  7.6* 8.0*   GFR: Estimated Creatinine Clearance: 74.2 mL/min (by C-G formula based on Cr of 1.09). Liver Function Tests:  Recent Labs Lab 05/24/16 1031 05/25/16 0059  AST 34 26  ALT 30 22  ALKPHOS 58 39  BILITOT 0.8 0.5  PROT 6.8 5.4*  ALBUMIN 3.6 2.8*   No results for input(s): LIPASE, AMYLASE in the last 168 hours. No results for input(s): AMMONIA in the last 168 hours. Coagulation Profile:  Recent Labs Lab 05/24/16 1031 05/24/16 1445  INR 1.26 1.38   Cardiac Enzymes:  Recent Labs Lab 05/24/16 1855 05/25/16 0059 05/25/16 0655 05/25/16 1246 05/25/16 1958  TROPONINI 0.11* 0.41* 0.45* 0.48* 1.04*   BNP (last 3 results) No results for input(s): PROBNP in the last 8760 hours. HbA1C: No results for input(s): HGBA1C in the last 72 hours. CBG:  Recent Labs Lab 05/25/16 0737 05/25/16 1138 05/25/16 1630 05/25/16 2128 05/26/16 0729  GLUCAP 103* 121* 88 108* 122*   Lipid Profile: No results for input(s): CHOL, HDL, LDLCALC, TRIG, CHOLHDL, LDLDIRECT in the last 72 hours. Thyroid Function Tests: No results for input(s): TSH, T4TOTAL, FREET4, T3FREE, THYROIDAB in the last 72 hours. Anemia Panel: No results for input(s): VITAMINB12, FOLATE, FERRITIN, TIBC, IRON, RETICCTPCT in the last 72 hours. Sepsis Labs:  Recent Labs Lab 05/24/16 1041 05/24/16 1346 05/24/16 1445 05/24/16 1855  PROCALCITON  --   --  2.68  --   LATICACIDVEN 4.35* 3.27* 2.5* 2.6*    Recent Results (from the past 240 hour(s))  Blood Culture (routine x 2)     Status: Abnormal (Preliminary result)   Collection Time: 05/24/16 10:31 AM  Result Value Ref Range Status   Specimen Description BLOOD LEFT ANTECUBITAL  Final   Special Requests BOTTLES DRAWN AEROBIC AND ANAEROBIC 5ML  Final   Culture  Setup Time   Final    GRAM POSITIVE COCCI IN CLUSTERS IN PAIRS IN CHAINS IN BOTH AEROBIC AND ANAEROBIC BOTTLES CRITICAL RESULT CALLED TO, READ BACK BY AND VERIFIED WITH: B GREEN PHARMD 2335  05/24/16 A BROWNING Performed at [facility]    Culture GROUP B STREP(S.AGALACTIAE)ISOLATED (A)  Final   Report Status PENDING  Incomplete  Blood Culture (routine x 2)     Status: Abnormal (Preliminary result)   Collection Time: 05/24/16 10:42 AM  Result Value Ref Range Status   Specimen Description BLOOD LEFT WRIST  Final   Special Requests BOTTLES DRAWN AEROBIC AND ANAEROBIC 5ML  Final   Culture  Setup Time   Final    GRAM POSITIVE COCCI IN CLUSTERS IN PAIRS IN CHAINS IN BOTH AEROBIC AND ANAEROBIC BOTTLES CRITICAL RESULT CALLED TO, READ BACK BY AND VERIFIED WITH: B GREEN PHARMD 2335 05/24/16 A BROWNING Performed at [place] B STREP(S.AGALACTIAE)ISOLATED (A)  Final   Report Status PENDING  Incomplete  Blood Culture ID Panel (Reflexed)     Status: Abnormal   Collection Time: 05/24/16 10:42 AM  Result Value Ref Range Status  Enterococcus species NOT DETECTED NOT DETECTED Final   Vancomycin resistance NOT DETECTED NOT DETECTED Final   Listeria monocytogenes NOT DETECTED NOT DETECTED Final   Staphylococcus species NOT DETECTED NOT DETECTED Final   Staphylococcus aureus NOT DETECTED NOT DETECTED Final   Methicillin resistance NOT DETECTED NOT DETECTED Final   Streptococcus species DETECTED (A) NOT DETECTED Final    Comment: CRITICAL RESULT CALLED TO, READ BACK BY AND VERIFIED WITH: B GREEN PHARMD 2335 05/24/16 A BROWNING    Streptococcus agalactiae DETECTED (A) NOT DETECTED Final    Comment: CRITICAL RESULT CALLED TO, READ BACK BY AND VERIFIED WITH: B GREEN PHARMD 2335 05/24/16 A BROWNING    Streptococcus pneumoniae NOT DETECTED NOT DETECTED Final   Streptococcus pyogenes NOT DETECTED NOT DETECTED Final   Acinetobacter baumannii NOT DETECTED NOT DETECTED Final   Enterobacteriaceae species NOT DETECTED NOT DETECTED Final   Enterobacter cloacae complex NOT DETECTED NOT DETECTED Final   Escherichia coli NOT DETECTED NOT DETECTED Final   Klebsiella  oxytoca NOT DETECTED NOT DETECTED Final   Klebsiella pneumoniae NOT DETECTED NOT DETECTED Final   Proteus species NOT DETECTED NOT DETECTED Final   Serratia marcescens NOT DETECTED NOT DETECTED Final   Carbapenem resistance NOT DETECTED NOT DETECTED Final   Haemophilus influenzae NOT DETECTED NOT DETECTED Final   Neisseria meningitidis NOT DETECTED NOT DETECTED Final   Pseudomonas aeruginosa NOT DETECTED NOT DETECTED Final   Candida albicans NOT DETECTED NOT DETECTED Final   Candida glabrata NOT DETECTED NOT DETECTED Final   Candida krusei NOT DETECTED NOT DETECTED Final   Candida parapsilosis NOT DETECTED NOT DETECTED Final   Candida tropicalis NOT DETECTED NOT DETECTED Final    Comment: Performed at [facility]  Urine culture     Status: Abnormal   Collection Time: 05/24/16 12:25 PM  Result Value Ref Range Status   Specimen Description URINE, CATHETERIZED  Final   Special Requests NONE  Final   Culture (A)  Final    <10,000 COLONIES/mL INSIGNIFICANT GROWTH Performed at [facility]    Report Status 05/25/2016 FINAL  Final  MRSA PCR Screening     Status: None   Collection Time: 05/24/16  2:10 PM  Result Value Ref Range Status   MRSA by PCR NEGATIVE NEGATIVE Final    Comment:        The GeneXpert MRSA Assay (FDA approved for NASAL specimens only), is one component of a comprehensive MRSA colonization surveillance program. It is not intended to diagnose MRSA infection nor to guide or monitor treatment for MRSA infections.          Radiology Studies: Dg Chest Port 1 View  05/25/2016  CLINICAL DATA:  Dyspnea EXAM: PORTABLE CHEST 1 VIEW COMPARISON:  05/24/2016 FINDINGS: Cardiac shadow is stable. The lungs are well aerated bilaterally. There again noted sclerotic metastatic lesions consistent with the patient's given clinical history. Some suggestion of nodularity within the left lung is noted. This may be related to overlying bony tissues but a few of the  somewhat nodular areas appear to be extrinsic to the bone. Noncontrast CT may be helpful for further evaluation. Increased fullness in the right medial lung base is noted likely related to early infiltrate. IMPRESSION: Changes consistent with sclerotic bone mets. Slight nodularity in the left lung which may be extrinsic to the bony structures. Additionally some increased density in the right medial lung base is noted. CT may be helpful for further evaluation. Electronically Signed   By: Inez Catalina  M.D.   On: 05/25/2016 08:56   Dg Chest Port 1 View  05/24/2016  CLINICAL DATA:  Shortness of breath, fever and altered mental status for approximately 1-1/2 days. EXAM: PORTABLE CHEST 1 VIEW COMPARISON:  PA and lateral chest 05/13/2008. CT abdomen and pelvis 06/15/2015 FINDINGS: Innumerable sclerotic lesions are identified about the shoulders and in multiple ribs consistent with metastatic prostate carcinoma, new since the prior chest films. Bone metastases are seen on the prior CT Lung volumes are somewhat low. No pneumothorax or pleural effusion is seen. No consolidative process is identified. Heart size is normal. IMPRESSION: No acute cardiopulmonary disease. Innumerable sclerotic osseous lesions consistent with metastatic prostate carcinoma. Electronically Signed   By: Inge Rise M.D.   On: 05/24/2016 11:09        Scheduled Meds: . antiseptic oral rinse  7 mL Mouth Rinse BID  . aspirin  325 mg Oral QPM  . enoxaparin (LOVENOX) injection  40 mg Subcutaneous Q24H  . furosemide  40 mg Intravenous BID  . insulin aspart  0-15 Units Subcutaneous TID WC  . metoprolol tartrate  12.5 mg Oral BID  . piperacillin-tazobactam (ZOSYN)  IV  3.375 g Intravenous Q8H  . vancomycin  1,250 mg Intravenous Q24H   Continuous Infusions:    LOS: 2 days        Mauricio Gerome Apley, MD [facility] Pager [phone]  If 7PM-7AM, please contact night-coverage [email or web address] Password  [facility] 05/26/2016, 7:45 AM

## 2016-05-27 ENCOUNTER — Inpatient Hospital Stay (HOSPITAL_COMMUNITY): Payer: Medicare Other

## 2016-05-27 DIAGNOSIS — I519 Heart disease, unspecified: Secondary | ICD-10-CM | POA: Diagnosis present

## 2016-05-27 DIAGNOSIS — I5189 Other ill-defined heart diseases: Secondary | ICD-10-CM | POA: Diagnosis present

## 2016-05-27 DIAGNOSIS — I4510 Unspecified right bundle-branch block: Secondary | ICD-10-CM

## 2016-05-27 LAB — CULTURE, BLOOD (ROUTINE X 2)

## 2016-05-27 LAB — BASIC METABOLIC PANEL
ANION GAP: 6 (ref 5–15)
BUN: 16 mg/dL (ref 6–20)
CHLORIDE: 98 mmol/L — AB (ref 101–111)
CO2: 35 mmol/L — AB (ref 22–32)
Calcium: 8.2 mg/dL — ABNORMAL LOW (ref 8.9–10.3)
Creatinine, Ser: 1.03 mg/dL (ref 0.61–1.24)
GFR calc Af Amer: >60 mL/min (ref >60)
GFR calc non Af Amer: >60 mL/min (ref >60)
GLUCOSE: 153 mg/dL — AB (ref 65–99)
POTASSIUM: 3.5 mmol/L (ref 3.5–5.1)
Sodium: 139 mmol/L (ref 135–145)

## 2016-05-27 LAB — CBC WITH DIFFERENTIAL/PLATELET
BASOS ABS: 0 10*3/uL (ref 0.0–0.1)
Basophils Relative: 0 %
EOS PCT: 0 %
Eosinophils Absolute: 0 10*3/uL (ref 0.0–0.7)
HEMATOCRIT: 32.1 % — AB (ref 39.0–52.0)
Hemoglobin: 10.6 g/dL — ABNORMAL LOW (ref 13.0–17.0)
LYMPHS ABS: 0.9 10*3/uL (ref 0.7–4.0)
LYMPHS PCT: 9 %
MCH: 31.7 pg (ref 26.0–34.0)
MCHC: 33 g/dL (ref 30.0–36.0)
MCV: 96.1 fL (ref 78.0–100.0)
Monocytes Absolute: 0.6 10*3/uL (ref 0.1–1.0)
Monocytes Relative: 7 %
NEUTROS ABS: 8 10*3/uL — AB (ref 1.7–7.7)
Neutrophils Relative %: 84 %
PLATELETS: 153 10*3/uL (ref 150–400)
RBC: 3.34 MIL/uL — AB (ref 4.22–5.81)
RDW: 14.3 % (ref 11.5–15.5)
WBC: 9.6 10*3/uL (ref 4.0–10.5)

## 2016-05-27 LAB — GLUCOSE, CAPILLARY
GLUCOSE-CAPILLARY: 123 mg/dL — AB (ref 65–99)
Glucose-Capillary: 150 mg/dL — ABNORMAL HIGH (ref 65–99)
Glucose-Capillary: 148 mg/dL — ABNORMAL HIGH (ref 65–99)

## 2016-05-27 MED ORDER — DEXTROSE 5 % IV SOLN
4.0000 10*6.[IU] | INTRAVENOUS | Status: DC
  Administered 2016-05-28 – 2016-06-02 (×31): 4 10*6.[IU] via INTRAVENOUS
  Filled 2016-05-27 (×34): qty 4

## 2016-05-27 MED ORDER — FUROSEMIDE 10 MG/ML IJ SOLN
40.0000 mg | Freq: Two times a day (BID) | INTRAMUSCULAR | Status: AC
  Administered 2016-05-27 (×2): 40 mg via INTRAVENOUS
  Filled 2016-05-27 (×2): qty 4

## 2016-05-27 MED ORDER — FUROSEMIDE 40 MG PO TABS: 40.0000 mg | ORAL_TABLET | Freq: Every day | ORAL | Status: DC

## 2016-05-27 MED ORDER — POTASSIUM CHLORIDE 20 MEQ/15ML (10%) PO SOLN
40.0000 meq | Freq: Once | ORAL | Status: AC
  Administered 2016-05-27: 40 meq via ORAL
  Filled 2016-05-27: qty 30

## 2016-05-27 MED ORDER — POTASSIUM CHLORIDE 10 MEQ/100ML IV SOLN
10.0000 meq | INTRAVENOUS | Status: AC
  Administered 2016-05-27 (×3): 10 meq via INTRAVENOUS
  Filled 2016-05-27 (×3): qty 100

## 2016-05-27 NOTE — Progress Notes (Signed)
PROGRESS NOTE    Theodore [name]  [id number] DOB: Dec 24, 1945 DOA: 05/24/2016 PCP: Johnatha Zeidman Po, FNP    Brief Narrative: 70 yo gentleman admitted to the stepdown unit for management of sepsis secondary to bilateral lower extremity cellulitis. Lactic acid 4, WBC 20 at time of admission. Procalcitonin 2.68. Positive for grup B strept bacteremia. Volume overloaded, on diuresis.   Assessment & Plan:   Principal Problem:   Sepsis ([place]) Active Problems:   Type 2 diabetes mellitus with hyperglycemia (HCC)   Essential hypertension   CAD (coronary artery disease)   Morbid obesity ([place])   Acute respiratory failure with hypoxia (HCC)   Acute diastolic (congestive) heart failure (HCC)   Cellulitis of right lower extremity   Dyspnea   1. Cardiovascular. Sepsis. Due to bilateral lower extremities, cellulitis, blood cultures positive for streptococcus agalactiae. Will change antibiotic to penicillin per protocol.  TTE with normal LV function with no reported vegetation. Noted diastolic heart failure probably chronic. Continue metoprolol. May need TEE when more euvolemic, will continue diuresis with furosemide urine output over last 24 hours up to 3653 cc.   2. Pulmonary. Pulmonary edema. Chest film personally reviewed noted increase interstitial infiltrates, will continue diuresis with furosemide, urine output 3653 cc over last 24 hours, continue oxymetry monitor and oxygen supplementation per [place].   3. Nephrology. Renal function with stable cr at 1.03, with K at 3,5, will replete k and follow renal panel in am will decrease furosemide to bid for now.   4, Skin. Bilateral cellulitis, will continue local wound care, pain control.   Patient at moderate risk for worsening sepsis.    DVT prophylaxis: Lovenox Code Status: FULL CODE Family Communication: I spoke with patient's family at the bedside today and all questions were addressed, key information for patient's care was obtained.    Disposition Plan:   Consultants:   Cardiology  Procedures:  Antimicrobials:  Vancomycin #3 Zosyn           #3  Subjective: Patient feeling very tired, generalized malaise, dyspnea has improved, no chest pain or palpitation, very poor oral intake due to low appetite. No nausea or vomiting.   Objective: Filed Vitals:   05/27/16 0400 05/27/16 0401 05/27/16 0500 05/27/16 0600  BP: 159/67  148/75 154/70  Pulse: 93  92 96  Temp:  98 F (36.7 C)    TempSrc:  Oral    Resp: 25  23 21   Height:      Weight:  118.434 kg (261 lb 1.6 oz)    SpO2: 95%  94% 96%    Intake/Output Summary (Last 24 hours) at 05/27/16 0744 Last data filed at 05/27/16 0229  Gross per 24 hour  Intake    530 ml  Output   3653 ml  Net  -3123 ml   Filed Weights   05/24/16 1500 05/26/16 0600 05/27/16 0401  Weight: 119.8 kg (264 lb 1.8 oz) 118.8 kg (261 lb 14.5 oz) 118.434 kg (261 lb 1.6 oz)    Examination:  General exam: deconditioned and ill looking appearing E ENT: conjunctival pallor with dry oral mucosa. Respiratory system: Decreased breath sounds at bases. No wheezing, rales or rhonchi. Anterior auscultation on supine position.  Cardiovascular system: S1 & S2 heard, RRR. No JVD, murmurs, rubs, gallops or clicks.  Lower extremities with compression bandages, upper thigh with 1+ pitting edema, upper ext, positive non pitting edema.  Gastrointestinal system: Abdomen protuberant but nondistended, soft and nontender. No organomegaly or masses felt. Normal bowel sounds  heard. Central nervous system: Somnolent. No focal neurological deficits. Extremities: Symmetric 5 x 5 power. Skin: No rashes, lesions or ulcers      Data Reviewed: I have personally reviewed following labs and imaging studies  CBC:  Recent Labs Lab 05/24/16 1031 05/24/16 1042 05/25/16 0059 05/26/16 0334 05/27/16 0334  WBC 20.9*  --  14.4* 11.9* 9.6  NEUTROABS 17.9*  --   --  10.1* 8.0*  HGB 12.1* 12.9* 10.4* 10.8* 10.6*  HCT  35.7* 38.0* 31.0* 32.7* 32.1*  MCV 94.2  --  95.1 95.9 96.1  PLT 176  --  136* 158 0000000   Basic Metabolic Panel:  Recent Labs Lab 05/24/16 1031 05/24/16 1042 05/25/16 0059 05/26/16 0334 05/27/16 0334  NA 137 137 138 137 139  K 3.8 3.9 3.9 3.8 3.5  CL 101 99* 107 102 98*  CO2 24  --  25 27 35*  GLUCOSE 158* 153* 107* 129* 153*  BUN 28* 25* 25* 19 16  CREATININE 1.21 1.20 1.15 1.09 1.03  CALCIUM 8.7*  --  7.6* 8.0* 8.2*   GFR: Estimated Creatinine Clearance: 78.4 mL/min (by C-G formula based on Cr of 1.03). Liver Function Tests:  Recent Labs Lab 05/24/16 1031 05/25/16 0059  AST 34 26  ALT 30 22  ALKPHOS 58 39  BILITOT 0.8 0.5  PROT 6.8 5.4*  ALBUMIN 3.6 2.8*   No results for input(s): LIPASE, AMYLASE in the last 168 hours. No results for input(s): AMMONIA in the last 168 hours. Coagulation Profile:  Recent Labs Lab 05/24/16 1031 05/24/16 1445  INR 1.26 1.38   Cardiac Enzymes:  Recent Labs Lab 05/24/16 1855 05/25/16 0059 05/25/16 0655 05/25/16 1246 05/25/16 1958  TROPONINI 0.11* 0.41* 0.45* 0.48* 1.04*   BNP (last 3 results) No results for input(s): PROBNP in the last 8760 hours. HbA1C: No results for input(s): HGBA1C in the last 72 hours. CBG:  Recent Labs Lab 05/25/16 2128 05/26/16 0729 05/26/16 1129 05/26/16 1702 05/26/16 2129  GLUCAP 108* 122* 140* 152* 140*   Lipid Profile: No results for input(s): CHOL, HDL, LDLCALC, TRIG, CHOLHDL, LDLDIRECT in the last 72 hours. Thyroid Function Tests: No results for input(s): TSH, T4TOTAL, FREET4, T3FREE, THYROIDAB in the last 72 hours. Anemia Panel: No results for input(s): VITAMINB12, FOLATE, FERRITIN, TIBC, IRON, RETICCTPCT in the last 72 hours. Sepsis Labs:  Recent Labs Lab 05/24/16 1041 05/24/16 1346 05/24/16 1445 05/24/16 1855  PROCALCITON  --   --  2.68  --   LATICACIDVEN 4.35* 3.27* 2.5* 2.6*    Recent Results (from the past 240 hour(s))  Blood Culture (routine x 2)     Status:  Abnormal (Preliminary result)   Collection Time: 05/24/16 10:31 AM  Result Value Ref Range Status   Specimen Description BLOOD LEFT ANTECUBITAL  Final   Special Requests BOTTLES DRAWN AEROBIC AND ANAEROBIC 5ML  Final   Culture  Setup Time   Final    GRAM POSITIVE COCCI IN CLUSTERS IN PAIRS IN CHAINS IN BOTH AEROBIC AND ANAEROBIC BOTTLES CRITICAL RESULT CALLED TO, READ BACK BY AND VERIFIED WITH: B GREEN PHARMD 2335 05/24/16 A BROWNING Performed at [place] B STREP(S.AGALACTIAE)ISOLATED (A)  Final   Report Status PENDING  Incomplete  Blood Culture (routine x 2)     Status: Abnormal (Preliminary result)   Collection Time: 05/24/16 10:42 AM  Result Value Ref Range Status   Specimen Description BLOOD LEFT WRIST  Final   Special Requests BOTTLES DRAWN AEROBIC  AND ANAEROBIC 5ML  Final   Culture  Setup Time   Final    GRAM POSITIVE COCCI IN CLUSTERS IN PAIRS IN CHAINS IN BOTH AEROBIC AND ANAEROBIC BOTTLES CRITICAL RESULT CALLED TO, READ BACK BY AND VERIFIED WITH: B GREEN PHARMD 2335 05/24/16 A BROWNING    Culture (A)  Final    GROUP B STREP(S.AGALACTIAE)ISOLATED SUSCEPTIBILITIES TO FOLLOW Performed at [facility]    Report Status PENDING  Incomplete  Blood Culture ID Panel (Reflexed)     Status: Abnormal   Collection Time: 05/24/16 10:42 AM  Result Value Ref Range Status   Enterococcus species NOT DETECTED NOT DETECTED Final   Vancomycin resistance NOT DETECTED NOT DETECTED Final   Listeria monocytogenes NOT DETECTED NOT DETECTED Final   Staphylococcus species NOT DETECTED NOT DETECTED Final   Staphylococcus aureus NOT DETECTED NOT DETECTED Final   Methicillin resistance NOT DETECTED NOT DETECTED Final   Streptococcus species DETECTED (A) NOT DETECTED Final    Comment: CRITICAL RESULT CALLED TO, READ BACK BY AND VERIFIED WITH: B GREEN PHARMD 2335 05/24/16 A BROWNING    Streptococcus agalactiae DETECTED (A) NOT DETECTED Final    Comment: CRITICAL  RESULT CALLED TO, READ BACK BY AND VERIFIED WITH: B GREEN PHARMD 2335 05/24/16 A BROWNING    Streptococcus pneumoniae NOT DETECTED NOT DETECTED Final   Streptococcus pyogenes NOT DETECTED NOT DETECTED Final   Acinetobacter baumannii NOT DETECTED NOT DETECTED Final   Enterobacteriaceae species NOT DETECTED NOT DETECTED Final   Enterobacter cloacae complex NOT DETECTED NOT DETECTED Final   Escherichia coli NOT DETECTED NOT DETECTED Final   Klebsiella oxytoca NOT DETECTED NOT DETECTED Final   Klebsiella pneumoniae NOT DETECTED NOT DETECTED Final   Proteus species NOT DETECTED NOT DETECTED Final   Serratia marcescens NOT DETECTED NOT DETECTED Final   Carbapenem resistance NOT DETECTED NOT DETECTED Final   Haemophilus influenzae NOT DETECTED NOT DETECTED Final   Neisseria meningitidis NOT DETECTED NOT DETECTED Final   Pseudomonas aeruginosa NOT DETECTED NOT DETECTED Final   Candida albicans NOT DETECTED NOT DETECTED Final   Candida glabrata NOT DETECTED NOT DETECTED Final   Candida krusei NOT DETECTED NOT DETECTED Final   Candida parapsilosis NOT DETECTED NOT DETECTED Final   Candida tropicalis NOT DETECTED NOT DETECTED Final    Comment: Performed at [facility]  Urine culture     Status: Abnormal   Collection Time: 05/24/16 12:25 PM  Result Value Ref Range Status   Specimen Description URINE, CATHETERIZED  Final   Special Requests NONE  Final   Culture (A)  Final    <10,000 COLONIES/mL INSIGNIFICANT GROWTH Performed at [facility]    Report Status 05/25/2016 FINAL  Final  MRSA PCR Screening     Status: None   Collection Time: 05/24/16  2:10 PM  Result Value Ref Range Status   MRSA by PCR NEGATIVE NEGATIVE Final    Comment:        The GeneXpert MRSA Assay (FDA approved for NASAL specimens only), is one component of a comprehensive MRSA colonization surveillance program. It is not intended to diagnose MRSA infection nor to guide or monitor treatment for MRSA  infections.   Culture, blood (Routine X 2) w Reflex to ID Panel     Status: None (Preliminary result)   Collection Time: 05/25/16 12:46 PM  Result Value Ref Range Status   Specimen Description BLOOD RIGHT HAND  Final   Special Requests BOTTLES DRAWN AEROBIC AND ANAEROBIC 5CC  Final  Culture   Final    NO GROWTH 1 DAY Performed at [facility]    Report Status PENDING  Incomplete  Culture, blood (Routine X 2) w Reflex to ID Panel     Status: None (Preliminary result)   Collection Time: 05/25/16 12:46 PM  Result Value Ref Range Status   Specimen Description BLOOD LEFT HAND  Final   Special Requests BOTTLES DRAWN AEROBIC AND ANAEROBIC 5CC  Final   Culture   Final    NO GROWTH 1 DAY Performed at [facility]    Report Status PENDING  Incomplete         Radiology Studies: Dg Chest 1 View  05/26/2016  CLINICAL DATA:  Shortness of breath, followup EXAM: CHEST 1 VIEW COMPARISON:  Chest x-ray of 05/25/2016 FINDINGS: The lungs are not as well opacified with more atelectasis at the lung bases. There may be mild pulmonary vascular congestion present. The questioned vague nodularity in the left mid lung may be bony in origin, but again a if necessary CT the chest could be performed to assess for pulmonary nodules. The heart is mildly enlarged and stable. There are degenerative changes throughout the thoracic spine. IMPRESSION: 1. Decreased aeration with increasing bibasilar atelectasis. 2. Possible mild pulmonary vascular congestion. 3. Probable sclerotic bone metastasis. Cannot exclude pulmonary nodules. Consider CT of the chest if warranted. Electronically Signed   By: Ivar Drape M.D.   On: 05/26/2016 08:06   Dg Chest Port 1 View  05/25/2016  CLINICAL DATA:  Dyspnea EXAM: PORTABLE CHEST 1 VIEW COMPARISON:  05/24/2016 FINDINGS: Cardiac shadow is stable. The lungs are well aerated bilaterally. There again noted sclerotic metastatic lesions consistent with the patient's given  clinical history. Some suggestion of nodularity within the left lung is noted. This may be related to overlying bony tissues but a few of the somewhat nodular areas appear to be extrinsic to the bone. Noncontrast CT may be helpful for further evaluation. Increased fullness in the right medial lung base is noted likely related to early infiltrate. IMPRESSION: Changes consistent with sclerotic bone mets. Slight nodularity in the left lung which may be extrinsic to the bony structures. Additionally some increased density in the right medial lung base is noted. CT may be helpful for further evaluation. Electronically Signed   By: Inez Catalina M.D.   On: 05/25/2016 08:56        Scheduled Meds: . antiseptic oral rinse  7 mL Mouth Rinse BID  . aspirin  325 mg Oral QPM  . enoxaparin (LOVENOX) injection  40 mg Subcutaneous Q24H  . furosemide  40 mg Intravenous TID  . insulin aspart  0-15 Units Subcutaneous TID WC  . metoprolol tartrate  12.5 mg Oral BID  . piperacillin-tazobactam (ZOSYN)  IV  3.375 g Intravenous Q8H  . vancomycin  1,250 mg Intravenous Q24H   Continuous Infusions:    LOS: 3 days        Negan Grudzien Gerome Apley, MD [facility] Pager [phone]  If 7PM-7AM, please contact night-coverage [email or web address] Password [facility] 05/27/2016, 7:44 AM

## 2016-05-27 NOTE — Progress Notes (Signed)
Patient Profile: 70 y.o. obese,male with medical history significant of metastatic prostate cancer, DM, CAD with CTO of RCA, untreated sleep apnea, and PVD who was admitted 05/24/16 with AMS, fever/ sepsis 2/2 bilateral LE cellulitis. He now has + positive blood cultures growing gram positive cocci, streptococcus species. Cardiology consulted for CHF, + troponin and evaluation for TEE.   Subjective: Sitting up in bed. He still complains of SOB "at times". He tells me he hasn't been able to lay flat for 15 yrs-"can't breath". He declined recommended sleep study in the past.   Objective: Vital signs in last 24 hours: Temp:  [98 F (36.7 C)-98.9 F (37.2 C)] 98 F (36.7 C) (07/14 0401) Pulse Rate:  [79-120] 95 (07/14 0900) Resp:  [18-28] 24 (07/14 0900) BP: (133-159)/(55-76) 143/58 mmHg (07/14 0800) SpO2:  [91 %-98 %] 93 % (07/14 0900) Weight:  [261 lb 1.6 oz (118.434 kg)] 261 lb 1.6 oz (118.434 kg) (07/14 0401) Last BM Date: 05/27/16  Intake/Output from previous day: 07/13 0701 - 07/14 0700 In: 41 [P.O.:180; IV Piggyback:350] Out: [id number] [Urine:3650; Stool:3] Intake/Output this shift: Total I/O In: -  Out: 1 [Stool:1]  Medications Current Facility-Administered Medications  Medication Dose Route Frequency Provider Last Rate Last Dose  . acetaminophen (TYLENOL) tablet 650 mg  650 mg Oral Q6H PRN Belkys A Regalado, MD      . antiseptic oral rinse (CPC / CETYLPYRIDINIUM CHLORIDE 0.05%) solution 7 mL  7 mL Mouth Rinse BID Lily Kocher, MD   7 mL at 05/27/16 1000  . aspirin tablet 325 mg  325 mg Oral QPM Belkys A Regalado, MD   325 mg at 05/26/16 1741  . enoxaparin (LOVENOX) injection 40 mg  40 mg Subcutaneous Q24H Belkys A Regalado, MD   40 mg at 05/26/16 1740  . furosemide (LASIX) injection 40 mg  40 mg Intravenous BID Tawni Millers, MD   40 mg at 05/27/16 [id number]  . insulin aspart (novoLOG) injection 0-15 Units  0-15 Units Subcutaneous TID WC Elmarie Shiley, MD   2 Units  at 05/27/16 [id number]  . metoprolol tartrate (LOPRESSOR) tablet 12.5 mg  12.5 mg Oral BID Lily Kocher, MD   12.5 mg at 05/27/16 0958  . ondansetron (ZOFRAN) injection 4 mg  4 mg Intravenous Q6H PRN Belkys A Regalado, MD   4 mg at 05/27/16 0829  . piperacillin-tazobactam (ZOSYN) IVPB 3.375 g  3.375 g Intravenous Q8H Donald Prose Runyon, RPH   3.375 g at 05/27/16 0958  . vancomycin (VANCOCIN) 1,250 mg in sodium chloride 0.9 % 250 mL IVPB  1,250 mg Intravenous Q24H Donald Prose Runyon, RPH   1,250 mg at 05/27/16 [id number]    PE: Blood pressure 120/50, pulse 110, temperature 99.2 F (37.3 C), temperature source Oral, resp. rate 26, height 5\' 5"  (1.651 m), weight 264 lb 1.8 oz (119.8 kg), SpO2 100 %.  General: Pleasant, NAD, obese,pale, and edematous  Psych: Normal affect. Neuro: Alert and oriented X 3. Moves all extremities spontaneously. HEENT: Normal Neck: Supple without bruits or JVD. Lungs: Resp regular and unlabored, decreased BS at the bases  Heart: RRR no s3, s4, or murmurs. Abdomen: obese Soft, non-tender, non-distended, BS + x 4.  Extremities: upper extremities are edematous, bilateral LEs wrapped  Lab Results:   Recent Labs  05/25/16 0059 05/26/16 0334 05/27/16 0334  WBC 14.4* 11.9* 9.6  HGB 10.4* 10.8* 10.6*  HCT 31.0* 32.7* 32.1*  PLT 136* 158 153   BMET  Recent Labs  05/25/16  [id number] 05/26/16 0334 05/27/16 0334  NA 138 137 139  K 3.9 3.8 3.5  CL 107 102 98*  CO2 25 27 35*  GLUCOSE 107* 129* 153*  BUN 25* 19 16  CREATININE 1.15 1.09 1.03  CALCIUM 7.6* 8.0* 8.2*   PT/INR  Recent Labs  05/24/16 1445  LABPROT 16.5*  INR 1.38   Cardiac Panel (last 3 results)  Recent Labs  05/25/16 0655 05/25/16 1246 05/25/16 1958  TROPONINI 0.45* 0.48* 1.04*    Studies/Results: 2D Echo 05/25/16 Study Conclusions  - Left ventricle: The cavity size was normal. There was moderate  focal basal hypertrophy of the septum. Systolic function was  vigorous. The estimated  ejection fraction was in the range of 65%  to 70%. Wall motion was normal; there were no regional wall  motion abnormalities. Features are consistent with a pseudonormal  left ventricular filling pattern, with concomitant abnormal  relaxation and increased filling pressure (grade 2 diastolic  dysfunction). Doppler parameters are consistent with elevated  ventricular end-diastolic filling pressure. - Aortic root: The aortic root was normal in size. - Mitral valve: Calcified annulus. Mildly thickened leaflets . - Left atrium: The atrium was mildly dilated. - Right atrium: The atrium was normal in size. - Tricuspid valve: There was mild regurgitation. - Pulmonary arteries: Systolic pressure was within the normal  range. - Inferior vena cava: The vessel was normal in size. - Pericardium, extracardiac: There was no pericardial effusion.  Impressions:  - limited study quality secondary to tachycardia during the  acquisition.   Assessment/Plan  Principal Problem:   Acute respiratory failure with hypoxia (HCC) Active Problems:   Sepsis (HCC)   Acute diastolic (congestive) heart failure (HCC)   Cellulitis of right lower extremity   Dyspnea   Type 2 diabetes mellitus with hyperglycemia (HCC)   Essential hypertension   CAD (coronary artery disease)   Morbid obesity ([place])   2d echo shows left ventricular diastolic dysfunction-grade 2   RBBB   Sleep apnea by history- never followed through with sleep study  1. Sepsis: 2/2 to bacteremia. Management Per IM.   2. Bacteremia: 2/2 bilateral LE cellulitis. Blood cultures + for gram positive cocci, streptococcus species. Antibiotics per IM. No significant valvular abnormalities nor vegetations noted on 2D echo, however he may still need a TEE to better assess cardiac valves to fully r/o endocarditis. We will need to adequately diurese him first.  3. Acute Diastolic CHF: patient developed acute respiratory hypoxic failure, CXR w/o  notable pulmonary edema, however he has significant peripheral edema/ anasarca. BNP only 214.  2D Echo 05/25/16 demonstrated vigorous LV systolic function with an EF of 65-70% and grade 2DD. He has responded to IV Lasix. -2.5L out so far. Renal function, K and BP all stable. Continue IV diuresis.   4. Elevated Troponin: 0.11>0.41>>0.45>>0.48>>1.04. This may be secondary to demand ischemia in the setting of sepsis/ bacteremia. He may need ischemic evaluation when more stable.   5. Sleep apnea- unlikely he will be able to lay flat at any point.     Plan: Continue IV diuretics. Will discuss with MD-  ? Set up TEE for Monday.  Kerin Ransom PA-C 05/27/2016 11:03 AM

## 2016-05-28 DIAGNOSIS — I10 Essential (primary) hypertension: Secondary | ICD-10-CM

## 2016-05-28 LAB — CBC WITH DIFFERENTIAL/PLATELET
BASOS ABS: 0 10*3/uL (ref 0.0–0.1)
BASOS PCT: 1 %
Eosinophils Absolute: 0.1 10*3/uL (ref 0.0–0.7)
Eosinophils Relative: 1 %
HCT: 32.6 % — ABNORMAL LOW (ref 39.0–52.0)
Hemoglobin: 10.9 g/dL — ABNORMAL LOW (ref 13.0–17.0)
LYMPHS PCT: 13 %
Lymphs Abs: 1.1 10*3/uL (ref 0.7–4.0)
MCH: 31.9 pg (ref 26.0–34.0)
MCHC: 33.4 g/dL (ref 30.0–36.0)
MCV: 95.3 fL (ref 78.0–100.0)
MONO ABS: 0.7 10*3/uL (ref 0.1–1.0)
Monocytes Relative: 8 %
NEUTROS ABS: 6.5 10*3/uL (ref 1.7–7.7)
NEUTROS PCT: 77 %
PLATELETS: 170 10*3/uL (ref 150–400)
RBC: 3.42 MIL/uL — AB (ref 4.22–5.81)
RDW: 13.8 % (ref 11.5–15.5)
WBC: 8.3 10*3/uL (ref 4.0–10.5)

## 2016-05-28 LAB — GLUCOSE, CAPILLARY
GLUCOSE-CAPILLARY: 120 mg/dL — AB (ref 65–99)
GLUCOSE-CAPILLARY: 111 mg/dL — AB (ref 65–99)
Glucose-Capillary: 155 mg/dL — ABNORMAL HIGH (ref 65–99)
Glucose-Capillary: 143 mg/dL — ABNORMAL HIGH (ref 65–99)

## 2016-05-28 LAB — BASIC METABOLIC PANEL
Anion gap: 8 (ref 5–15)
BUN: 16 mg/dL (ref 6–20)
CALCIUM: 8.4 mg/dL — AB (ref 8.9–10.3)
CO2: 36 mmol/L — ABNORMAL HIGH (ref 22–32)
CREATININE: 0.89 mg/dL (ref 0.61–1.24)
Chloride: 94 mmol/L — ABNORMAL LOW (ref 101–111)
Glucose, Bld: 115 mg/dL — ABNORMAL HIGH (ref 65–99)
Potassium: 3.5 mmol/L (ref 3.5–5.1)
SODIUM: 138 mmol/L (ref 135–145)

## 2016-05-28 MED ORDER — CARVEDILOL 6.25 MG PO TABS
6.2500 mg | ORAL_TABLET | Freq: Two times a day (BID) | ORAL | Status: DC
  Administered 2016-05-28 – 2016-06-04 (×13): 6.25 mg via ORAL
  Filled 2016-05-28 (×13): qty 1

## 2016-05-28 MED ORDER — ASPIRIN 81 MG PO CHEW
81.0000 mg | CHEWABLE_TABLET | Freq: Every day | ORAL | Status: DC
  Administered 2016-05-28 – 2016-06-04 (×7): 81 mg via ORAL
  Filled 2016-05-28 (×7): qty 1

## 2016-05-28 MED ORDER — LISINOPRIL 10 MG PO TABS
5.0000 mg | ORAL_TABLET | Freq: Every day | ORAL | Status: DC
  Administered 2016-05-28 – 2016-06-04 (×7): 5 mg via ORAL
  Filled 2016-05-28: qty 1
  Filled 2016-05-28 (×2): qty 2
  Filled 2016-05-28: qty 1
  Filled 2016-05-28: qty 2
  Filled 2016-05-28 (×2): qty 1

## 2016-05-28 MED ORDER — FUROSEMIDE 10 MG/ML IJ SOLN
40.0000 mg | Freq: Two times a day (BID) | INTRAMUSCULAR | Status: AC
  Administered 2016-05-28 (×2): 40 mg via INTRAVENOUS
  Filled 2016-05-28 (×2): qty 4

## 2016-05-28 NOTE — Progress Notes (Signed)
PROGRESS NOTE    Theodore [name]  [id number] DOB: 1946-05-25 DOA: 05/24/2016 PCP: [name], FNP    Brief Narrative: 70 yo gentleman admitted to the stepdown unit for management of sepsis secondary to bilateral lower extremity cellulitis. Lactic acid 4, WBC 20 at time of admission. Procalcitonin 2.68. Positive for grup B strept bacteremia. Volume overloaded, on diuresis with good response.  Assessment & Plan:   Principal Problem:   Acute respiratory failure with hypoxia (HCC) Active Problems:   Type 2 diabetes mellitus with hyperglycemia (HCC)   Essential hypertension   RBBB   Sleep apnea by history- never followed through with sleep study   CAD (coronary artery disease)   Morbid obesity ([place])   Sepsis ([place])   Acute diastolic (congestive) heart failure (HCC)   Cellulitis of right lower extremity   Dyspnea   2d echo shows left ventricular diastolic dysfunction-grade 2   1. Cardiovascular. Sepsis. Due to bilateral lower extremity cellulitis, blood cultures positive for streptococcus agalactiae. Antibiotic to penicillin 7/15.  TTE with normal LV function with no reported vegetation. Noted diastolic heart failure probably chronic. Continue metoprolol. May need TEE when more euvolemic, will continue diuresis with furosemide. Cardiology following.  2. Pulmonary. Pulmonary edema. Still with O2 requirement, continue diuresis with furosemide, good urine output, continue oxymetry monitor and oxygen supplementation per [place].   3. Nephrology. Renal function with stable cr, follow renal panel in AM will cont furosemide bid for now.   4, Skin. Bilateral cellulitis, will continue local wound care, pain control.   Patient at moderate risk for worsening sepsis.    DVT prophylaxis: Lovenox Code Status: FULL CODE Family Communication: I spoke with patient's family at the bedside today and all questions were addressed. Disposition Plan: Likely to home at d/c in 3-5 days. Transfer to  telemetry floor today. Discussed with bedside RN this AM as well.   Consultants:   Cardiology  Procedures:  Antimicrobials:  Vancomycin/Zosyn change to PCN G 7/15. Zosyn           #3  Subjective: Patient feels tired, no appetite. Having trouble sleeping at night since he's used to sleeping in a recliner. Says he did feel febrile last night around midnight when a fever was documented. Denies chest pain, back pain, nausea.  Objective: Filed Vitals:   05/28/16 0000 05/28/16 0200 05/28/16 0400 05/28/16 0600  BP: 157/71 175/79 176/80 174/91  Pulse: 87 96 95 96  Temp: 101.1 F (38.4 C)  98.2 F (36.8 C)   TempSrc: Oral  Core (Comment)   Resp: 21 18 28 21   Height:      Weight:      SpO2: 95% 90% 94% 94%    Intake/Output Summary (Last 24 hours) at 05/28/16 0826 Last data filed at 05/28/16 0300  Gross per 24 hour  Intake    650 ml  Output   2200 ml  Net  -1550 ml   Filed Weights   05/24/16 1500 05/26/16 0600 05/27/16 0401  Weight: 119.8 kg (264 lb 1.8 oz) 118.8 kg (261 lb 14.5 oz) 118.434 kg (261 lb 1.6 oz)    Examination:  General exam: deconditioned and ill looking appearing E ENT: conjunctival pallor with dry oral mucosa. Respiratory system: Decreased breath sounds at bases. No wheezing, rales or rhonchi. Anterior auscultation on supine position.  Cardiovascular system: S1 & S2 heard, RRR. No JVD, murmurs, rubs, gallops or clicks.  Lower extremities with compression bandages, upper thigh with 1+ pitting edema, upper ext, positive non  pitting edema.  Gastrointestinal system: Abdomen protuberant but nondistended, soft and nontender. No organomegaly or masses felt. Normal bowel sounds heard. Central nervous system: Somnolent. No focal neurological deficits. Extremities: Symmetric 5 x 5 power. Skin: No rashes, lesions or ulcers      Data Reviewed: I have personally reviewed following labs and imaging studies  CBC:  Recent Labs Lab 05/24/16 1031 05/24/16 1042  05/25/16 0059 05/26/16 0334 05/27/16 0334 05/28/16 0328  WBC 20.9*  --  14.4* 11.9* 9.6 8.3  NEUTROABS 17.9*  --   --  10.1* 8.0* 6.5  HGB 12.1* 12.9* 10.4* 10.8* 10.6* 10.9*  HCT 35.7* 38.0* 31.0* 32.7* 32.1* 32.6*  MCV 94.2  --  95.1 95.9 96.1 95.3  PLT 176  --  136* 158 153 [id number]   Basic Metabolic Panel:  Recent Labs Lab 05/24/16 1031 05/24/16 1042 05/25/16 0059 05/26/16 0334 05/27/16 0334 05/28/16 0328  NA 137 137 138 137 139 138  K 3.8 3.9 3.9 3.8 3.5 3.5  CL 101 99* 107 102 98* 94*  CO2 24  --  25 27 35* 36*  GLUCOSE 158* 153* 107* 129* 153* 115*  BUN 28* 25* 25* 19 16 16   CREATININE 1.21 1.20 1.15 1.09 1.03 0.89  CALCIUM 8.7*  --  7.6* 8.0* 8.2* 8.4*   GFR: Estimated Creatinine Clearance: 90.8 mL/min (by C-G formula based on Cr of 0.89). Liver Function Tests:  Recent Labs Lab 05/24/16 1031 05/25/16 0059  AST 34 26  ALT 30 22  ALKPHOS 58 39  BILITOT 0.8 0.5  PROT 6.8 5.4*  ALBUMIN 3.6 2.8*   No results for input(s): LIPASE, AMYLASE in the last 168 hours. No results for input(s): AMMONIA in the last 168 hours. Coagulation Profile:  Recent Labs Lab 05/24/16 1031 05/24/16 1445  INR 1.26 1.38   Cardiac Enzymes:  Recent Labs Lab 05/24/16 1855 05/25/16 0059 05/25/16 0655 05/25/16 1246 05/25/16 1958  TROPONINI 0.11* 0.41* 0.45* 0.48* 1.04*   BNP (last 3 results) No results for input(s): PROBNP in the last 8760 hours. HbA1C: No results for input(s): HGBA1C in the last 72 hours. CBG:  Recent Labs Lab 05/26/16 1702 05/26/16 2129 05/27/16 0800 05/27/16 1151 05/27/16 2300  GLUCAP 152* 140* 150* 148* 123*   Lipid Profile: No results for input(s): CHOL, HDL, LDLCALC, TRIG, CHOLHDL, LDLDIRECT in the last 72 hours. Thyroid Function Tests: No results for input(s): TSH, T4TOTAL, FREET4, T3FREE, THYROIDAB in the last 72 hours. Anemia Panel: No results for input(s): VITAMINB12, FOLATE, FERRITIN, TIBC, IRON, RETICCTPCT in the last 72  hours. Sepsis Labs:  Recent Labs Lab 05/24/16 1041 05/24/16 1346 05/24/16 1445 05/24/16 1855  PROCALCITON  --   --  2.68  --   LATICACIDVEN 4.35* 3.27* 2.5* 2.6*    Recent Results (from the past 240 hour(s))  Blood Culture (routine x 2)     Status: Abnormal   Collection Time: 05/24/16 10:31 AM  Result Value Ref Range Status   Specimen Description BLOOD LEFT ANTECUBITAL  Final   Special Requests BOTTLES DRAWN AEROBIC AND ANAEROBIC 5ML  Final   Culture  Setup Time   Final    GRAM POSITIVE COCCI IN CLUSTERS IN PAIRS IN CHAINS IN BOTH AEROBIC AND ANAEROBIC BOTTLES CRITICAL RESULT CALLED TO, READ BACK BY AND VERIFIED WITH: B GREEN PHARMD 2335 05/24/16 A BROWNING    Culture (A)  Final    GROUP B STREP(S.AGALACTIAE)ISOLATED SUSCEPTIBILITIES PERFORMED ON PREVIOUS CULTURE WITHIN THE LAST 5 DAYS. Performed at [facility]  Report Status 05/27/2016 FINAL  Final  Blood Culture (routine x 2)     Status: Abnormal   Collection Time: 05/24/16 10:42 AM  Result Value Ref Range Status   Specimen Description BLOOD LEFT WRIST  Final   Special Requests BOTTLES DRAWN AEROBIC AND ANAEROBIC 5ML  Final   Culture  Setup Time   Final    GRAM POSITIVE COCCI IN CLUSTERS IN PAIRS IN CHAINS IN BOTH AEROBIC AND ANAEROBIC BOTTLES CRITICAL RESULT CALLED TO, READ BACK BY AND VERIFIED WITH: B GREEN PHARMD 2335 05/24/16 A BROWNING Performed at [place] (A)  Final   Report Status 05/27/2016 FINAL  Final   Organism ID, Bacteria STREPTOCOCCUS AGALACTIAE  Final      Susceptibility   Streptococcus agalactiae - MIC*    CLINDAMYCIN >=1 RESISTANT Resistant     AMPICILLIN <=0.25 SENSITIVE Sensitive     ERYTHROMYCIN >=8 RESISTANT Resistant     VANCOMYCIN 0.5 SENSITIVE Sensitive     CEFTRIAXONE <=0.12 SENSITIVE Sensitive     LEVOFLOXACIN 1 SENSITIVE Sensitive     * STREPTOCOCCUS AGALACTIAE  Blood Culture ID Panel (Reflexed)     Status: Abnormal   Collection  Time: 05/24/16 10:42 AM  Result Value Ref Range Status   Enterococcus species NOT DETECTED NOT DETECTED Final   Vancomycin resistance NOT DETECTED NOT DETECTED Final   Listeria monocytogenes NOT DETECTED NOT DETECTED Final   Staphylococcus species NOT DETECTED NOT DETECTED Final   Staphylococcus aureus NOT DETECTED NOT DETECTED Final   Methicillin resistance NOT DETECTED NOT DETECTED Final   Streptococcus species DETECTED (A) NOT DETECTED Final    Comment: CRITICAL RESULT CALLED TO, READ BACK BY AND VERIFIED WITH: B GREEN PHARMD 2335 05/24/16 A BROWNING    Streptococcus agalactiae DETECTED (A) NOT DETECTED Final    Comment: CRITICAL RESULT CALLED TO, READ BACK BY AND VERIFIED WITH: B GREEN PHARMD 2335 05/24/16 A BROWNING    Streptococcus pneumoniae NOT DETECTED NOT DETECTED Final   Streptococcus pyogenes NOT DETECTED NOT DETECTED Final   Acinetobacter baumannii NOT DETECTED NOT DETECTED Final   Enterobacteriaceae species NOT DETECTED NOT DETECTED Final   Enterobacter cloacae complex NOT DETECTED NOT DETECTED Final   Escherichia coli NOT DETECTED NOT DETECTED Final   Klebsiella oxytoca NOT DETECTED NOT DETECTED Final   Klebsiella pneumoniae NOT DETECTED NOT DETECTED Final   Proteus species NOT DETECTED NOT DETECTED Final   Serratia marcescens NOT DETECTED NOT DETECTED Final   Carbapenem resistance NOT DETECTED NOT DETECTED Final   Haemophilus influenzae NOT DETECTED NOT DETECTED Final   Neisseria meningitidis NOT DETECTED NOT DETECTED Final   Pseudomonas aeruginosa NOT DETECTED NOT DETECTED Final   Candida albicans NOT DETECTED NOT DETECTED Final   Candida glabrata NOT DETECTED NOT DETECTED Final   Candida krusei NOT DETECTED NOT DETECTED Final   Candida parapsilosis NOT DETECTED NOT DETECTED Final   Candida tropicalis NOT DETECTED NOT DETECTED Final    Comment: Performed at [facility]  Urine culture     Status: Abnormal   Collection Time: 05/24/16 12:25 PM  Result Value  Ref Range Status   Specimen Description URINE, CATHETERIZED  Final   Special Requests NONE  Final   Culture (A)  Final    <10,000 COLONIES/mL INSIGNIFICANT GROWTH Performed at [facility]    Report Status 05/25/2016 FINAL  Final  MRSA PCR Screening     Status: None   Collection Time: 05/24/16  2:10 PM  Result Value Ref Range Status   MRSA by PCR NEGATIVE NEGATIVE Final    Comment:        The GeneXpert MRSA Assay (FDA approved for NASAL specimens only), is one component of a comprehensive MRSA colonization surveillance program. It is not intended to diagnose MRSA infection nor to guide or monitor treatment for MRSA infections.   Culture, blood (Routine X 2) w Reflex to ID Panel     Status: None (Preliminary result)   Collection Time: 05/25/16 12:46 PM  Result Value Ref Range Status   Specimen Description BLOOD RIGHT HAND  Final   Special Requests BOTTLES DRAWN AEROBIC AND ANAEROBIC 5CC  Final   Culture   Final    NO GROWTH 2 DAYS Performed at [facility]    Report Status PENDING  Incomplete  Culture, blood (Routine X 2) w Reflex to ID Panel     Status: None (Preliminary result)   Collection Time: 05/25/16 12:46 PM  Result Value Ref Range Status   Specimen Description BLOOD LEFT HAND  Final   Special Requests BOTTLES DRAWN AEROBIC AND ANAEROBIC 5CC  Final   Culture   Final    NO GROWTH 2 DAYS Performed at [facility]    Report Status PENDING  Incomplete         Radiology Studies: Dg Chest 1 View  05/27/2016  CLINICAL DATA:  70 y/o M; shortness of breath and cough for a few days. History of smoking, hypertension, and coronary artery disease. No history of asthma or COPD. EXAM: CHEST 1 VIEW COMPARISON:  Chest radiograph dated 05/26/2016 and 12/29/2005. FINDINGS: Stable Left-greater-than-right basilar opacities probably representing atelectasis and small effusions. Stable pulmonary vascular congestion. No consolidation or pneumothorax. Left mid  lung zone nodularity and sclerotic foci of the proximal humeri and right acromion again seen, not present in 2007. IMPRESSION: Stable basilar opacities probably representing atelectasis small effusions. Pulmonary vascular congestion. Densities projecting over left mid lung zone and sclerotic foci in the bones suggestive of sclerotic bony metastatic disease. Consider CT for further evaluation if clinically indicated. Electronically Signed   By: Kristine Garbe M.D.   On: 05/27/2016 09:51        Scheduled Meds: . antiseptic oral rinse  7 mL Mouth Rinse BID  . aspirin  325 mg Oral QPM  . enoxaparin (LOVENOX) injection  40 mg Subcutaneous Q24H  . furosemide  40 mg Oral Daily  . insulin aspart  0-15 Units Subcutaneous TID WC  . metoprolol tartrate  12.5 mg Oral BID  . pencillin G potassium IV  4 Million Units Intravenous Q4H   Continuous Infusions:    LOS: 4 days    23 minutes    Mir Marry Guan, MD [facility] Pager [phone]  If 7PM-7AM, please contact night-coverage [email or web address] Password [facility] 05/28/2016, 8:26 AM

## 2016-05-28 NOTE — Progress Notes (Addendum)
SUBJECTIVE: Remains volume overloaded.  + SOB overnight.  At this time, he denies chest pain or any new concerns.  Marland Kitchen antiseptic oral rinse  7 mL Mouth Rinse BID  . aspirin  325 mg Oral QPM  . enoxaparin (LOVENOX) injection  40 mg Subcutaneous Q24H  . furosemide  40 mg Intravenous BID  . insulin aspart  0-15 Units Subcutaneous TID WC  . metoprolol tartrate  12.5 mg Oral BID  . pencillin G potassium IV  4 Million Units Intravenous Q4H      OBJECTIVE: Physical Exam: Filed Vitals:   05/28/16 0000 05/28/16 0200 05/28/16 0400 05/28/16 0600  BP: 157/71 175/79 176/80 174/91  Pulse: 87 96 95 96  Temp: 101.1 F (38.4 C)  98.2 F (36.8 C)   TempSrc: Oral  Core (Comment)   Resp: 21 18 28 21   Height:      Weight:      SpO2: 95% 90% 94% 94%    Intake/Output Summary (Last 24 hours) at 05/28/16 0834 Last data filed at 05/28/16 0300  Gross per 24 hour  Intake    650 ml  Output   2200 ml  Net  -1550 ml    Telemetry reveals sinus rhythm  GEN- The patient is ill appearing, alert and oriented x 3 today.   Head- normocephalic, atraumatic Eyes-  Sclera clear, conjunctiva pink Ears- hearing intact Oropharynx- clear Neck- supple, + JVD Lungs- decreased BS at bases, normal work of breathing Heart- Regular rate and rhythm  GI- soft, NT, ND, + BS Extremities- no clubbing, cyanosis, + dependant edema Skin- no rash or lesion Psych- euthymic mood, full affect Neuro- strength and sensation are intact  LABS: Basic Metabolic Panel:  Recent Labs  05/27/16 0334 05/28/16 0328  NA 139 138  K 3.5 3.5  CL 98* 94*  CO2 35* 36*  GLUCOSE 153* 115*  BUN 16 16  CREATININE 1.03 0.89  CALCIUM 8.2* 8.4*   CBC:  Recent Labs  05/27/16 0334 05/28/16 0328  WBC 9.6 8.3  NEUTROABS 8.0* 6.5  HGB 10.6* 10.9*  HCT 32.1* 32.6*  MCV 96.1 95.3  PLT 153 170   Cardiac Enzymes:  Recent Labs  05/25/16 1246 05/25/16 1958  TROPONINI 0.48* 1.04*    ASSESSMENT AND PLAN:  Principal  Problem:   Acute respiratory failure with hypoxia (HCC) Active Problems:   Type 2 diabetes mellitus with hyperglycemia (HCC)   Essential hypertension   RBBB   Sleep apnea by history- never followed through with sleep study   CAD (coronary artery disease)   Morbid obesity (HCC)   Sepsis ([place])   Acute diastolic (congestive) heart failure (HCC)   Cellulitis of right lower extremity   Dyspnea   2d echo shows left ventricular diastolic dysfunction-grade 2  1. Sepsis/ bacteremia: 2/2 to bacteremia. Management Per IM.  TEE early next week as schedule allows (per Dr Debara Pickett, schedule already filled Monday and Tuesday)  2. Acute Diastolic CHF: patient remains volume overloaded on exam today.  Would continue IV diuresis over the weekend.  Strict Is and Os and daily weights.  Will add ace inhibitor.  3. Elevated Troponin:  Likely due to demand ischemia in the setting of sepsis/ bacteremia.  Will follow.  Consider myoview once clinically improved (possibly electively after discharge)  4. HTN BP is quite elevated Will switch metoprolol to coreg Given diabetes will also add ace inhibitor which should be titrated while here.  Cardiology to see as needed over the weekend Please call  with questions  Thompson Grayer, MD 05/28/2016 8:34 AM

## 2016-05-29 ENCOUNTER — Inpatient Hospital Stay (HOSPITAL_COMMUNITY): Payer: Medicare Other

## 2016-05-29 LAB — BLOOD GAS, ARTERIAL
Acid-Base Excess: 10.4 mmol/L — ABNORMAL HIGH (ref 0.0–2.0)
BICARBONATE: 37.1 meq/L — AB (ref 20.0–24.0)
Drawn by: 232811
O2 Content: 6 L/min
O2 Saturation: 94.3 %
PH ART: 7.392 (ref 7.350–7.450)
Patient temperature: 37
TCO2: 33.8 mmol/L (ref 0–100)
pCO2 arterial: 62.3 mmHg (ref 35.0–45.0)
pO2, Arterial: 76.5 mmHg — ABNORMAL LOW (ref 80.0–100.0)

## 2016-05-29 LAB — CBC
HCT: 32 % — ABNORMAL LOW (ref 39.0–52.0)
HEMOGLOBIN: 10.8 g/dL — AB (ref 13.0–17.0)
MCH: 31.7 pg (ref 26.0–34.0)
MCHC: 33.8 g/dL (ref 30.0–36.0)
MCV: 93.8 fL (ref 78.0–100.0)
Platelets: 184 10*3/uL (ref 150–400)
RBC: 3.41 MIL/uL — AB (ref 4.22–5.81)
RDW: 13.5 % (ref 11.5–15.5)
WBC: 9.5 10*3/uL (ref 4.0–10.5)

## 2016-05-29 LAB — BASIC METABOLIC PANEL
ANION GAP: 11 (ref 5–15)
BUN: 20 mg/dL (ref 6–20)
CHLORIDE: 92 mmol/L — AB (ref 101–111)
CO2: 37 mmol/L — ABNORMAL HIGH (ref 22–32)
Calcium: 8.5 mg/dL — ABNORMAL LOW (ref 8.9–10.3)
Creatinine, Ser: 0.96 mg/dL (ref 0.61–1.24)
Glucose, Bld: 173 mg/dL — ABNORMAL HIGH (ref 65–99)
POTASSIUM: 3.7 mmol/L (ref 3.5–5.1)
SODIUM: 140 mmol/L (ref 135–145)

## 2016-05-29 LAB — AMMONIA: Ammonia: 23 umol/L (ref 9–35)

## 2016-05-29 LAB — TROPONIN I
Troponin I: 0.31 ng/mL (ref <0.03)
Troponin I: 0.3 ng/mL (ref <0.03)

## 2016-05-29 LAB — GLUCOSE, CAPILLARY
GLUCOSE-CAPILLARY: 165 mg/dL — AB (ref 65–99)
GLUCOSE-CAPILLARY: 149 mg/dL — AB (ref 65–99)

## 2016-05-29 MED ORDER — IOPAMIDOL (ISOVUE-370) INJECTION 76%
100.0000 mL | Freq: Once | INTRAVENOUS | Status: AC | PRN
Start: 2016-05-29 — End: 2016-05-29
  Administered 2016-05-29: 100 mL via INTRAVENOUS

## 2016-05-29 MED ORDER — METOPROLOL TARTRATE 5 MG/5ML IV SOLN
2.5000 mg | Freq: Once | INTRAVENOUS | Status: AC
  Administered 2016-05-29: 2.5 mg via INTRAVENOUS
  Filled 2016-05-29: qty 5

## 2016-05-29 NOTE — Significant Event (Signed)
Rapid Response Event Note  Overview: Time Called: [id number] Arrival Time: 0100 Event Type: Respiratory, Unknown  Initial Focused Assessment:Patient very ashen in color and diaphoretic. Not answering questions or following commands. He was groaning/moaning at times and thrashing. His sats were 93% on 6L 02 (it had been increased from 2L) No IV access upon arrival. IV team arrived and an IV was started. Many attempts to get an EKG. Patient continued to be agitated and nonverbal. Very difficult to assess v/s. Within 30 minutes of arrival he became calmer and slight color improvement. Gradually he began to answer questions and follow commands. His color was more pale than ashen and sats were 97% on 6L. Dr. Alcario Drought was at bedside at [place]. Chart reviewed. Patient moved to 1230. He was able to state where he was upon transfer,less diaphoretic. He did not remember any of the previous events.   Interventions:Increased 02, EKG, ABG, MD Notified and came to room. CBG=165.   Plan of Care (if not transferred):  Event Summary: Name of Physician Notified: Dr. Alcario Drought at [place]    at    Outcome: Transferred (Comment) (to sd room 1230 )  Event End Time: [place], [place]

## 2016-05-29 NOTE — Progress Notes (Signed)
Gean Maidens called and updated that Theodore [name] had been transferred to 1230. He spoke with her briefly. He is slow to respond verbally but oriented to place and person.

## 2016-05-29 NOTE — Progress Notes (Signed)
PROGRESS NOTE    Theodore [name]  [id number] DOB: [birth year] DOA: 05/24/2016 PCP: [name], FNP   Brief Narrative: 70 yo gentleman admitted to the stepdown unit for management of sepsis secondary to bilateral lower extremity cellulitis. Lactic acid 4, WBC 20 at time of admission. Procalcitonin 2.68. Positive for grup B strep bacteremia. Volume overloaded, on diuresis with good response.  Assessment & Plan:   Principal Problem:   Acute respiratory failure with hypoxia (HCC) Active Problems:   Type 2 diabetes mellitus with hyperglycemia (HCC)   Essential hypertension   RBBB   Sleep apnea by history- never followed through with sleep study   CAD (coronary artery disease)   Morbid obesity ([place])   Sepsis ([place])   Acute diastolic (congestive) heart failure (HCC)   Cellulitis of right lower extremity   Dyspnea   2d echo shows left ventricular diastolic dysfunction-grade 2  1. Cardiovascular. Sepsis Due to bilateral lower extremity cellulitis, blood cultures positive for streptococcus agalactiae. Antibiotic to penicillin 7/15.  TTE with normal LV function with no reported vegetation. Noted diastolic heart failure probably chronic. Continue metoprolol. May need TEE when more euvolemic, will continue diuresis with furosemide. Cardiology following.  2. Pulmonary. Pulmonary edema Still with O2 requirement, continue diuresis with furosemide, good urine output, continue oxymetry monitor and oxygen supplementation per [place].   3. Nephrology. Renal function with stable cr, follow renal panel in AM will cont furosemide bid for now.   4. Skin. Bilateral cellulitis, will continue local wound care, pain control.   5. Neuro. Metabolic encephalopathy sudden mental status change last night, transferred to ICU. He's slightly confused but improving already, unclear etiology of his confusion. Will check ammonia now. Blood gas with CO2 of 60. Continue supportive care for now. Given trouble speaking  this AM will check Head CT to rule out CVA.  Patient at moderate risk for worsening sepsis.    DVT prophylaxis: Lovenox Code Status: FULL CODE Family Communication: I spoke with patient's wife at the bedside in ICU today and all questions were addressed. Disposition Plan: Likely to home at d/c in 3-5 days. Monitor in ICU today. Discussed with bedside RN this AM as well.   Consultants:   Cardiology  Procedures:  Antimicrobials:  Vancomycin/Zosyn change to PCN G 7/15. Zosyn           #3  Subjective: He is talking a little bit this AM. Per wife earlier today he was not speaking words at all. He is slow to answer questions.  Objective: Filed Vitals:   05/29/16 0900 05/29/16 1000 05/29/16 1100 05/29/16 1200  BP: 137/57 124/102 154/73 160/75  Pulse: 94 88 94 88  Temp:      TempSrc:      Resp: 24 21 18 23   Height:      Weight:      SpO2: 96% 94% 97% 95%    Intake/Output Summary (Last 24 hours) at 05/29/16 1312 Last data filed at 05/29/16 0900  Gross per 24 hour  Intake   1060 ml  Output   2150 ml  Net  -1090 ml   Filed Weights   05/26/16 0600 05/27/16 0401 05/28/16 1236  Weight: 118.8 kg (261 lb 14.5 oz) 118.434 kg (261 lb 1.6 oz) 115.894 kg (255 lb 8 oz)    Examination:  General exam: deconditioned and ill looking appearing, speaking clearly but confused about date, time, location. Knows where he is and recognizes his wife. E ENT: conjunctival pallor with dry oral mucosa. Respiratory  system: Decreased breath sounds at bases. No wheezing, rales or rhonchi. Anterior auscultation on supine position.  Cardiovascular system: S1 & S2 heard, RRR. No JVD, murmurs, rubs, gallops or clicks.  Lower extremities with compression bandages, upper thigh with 1+ pitting edema, upper ext, positive non pitting edema.  Gastrointestinal system: Abdomen protuberant but nondistended, soft and nontender. No organomegaly or masses felt. Normal bowel sounds heard. Central nervous system:  Somnolent. No focal neurological deficits. Extremities: Symmetric 5 x 5 power. Skin: No rashes, lesions or ulcers      Data Reviewed: I have personally reviewed following labs and imaging studies  CBC:  Recent Labs Lab 05/24/16 1031  05/25/16 0059 05/26/16 0334 05/27/16 0334 05/28/16 0328 05/29/16 0208  WBC 20.9*  --  14.4* 11.9* 9.6 8.3 9.5  NEUTROABS 17.9*  --   --  10.1* 8.0* 6.5  --   HGB 12.1*  < > 10.4* 10.8* 10.6* 10.9* 10.8*  HCT 35.7*  < > 31.0* 32.7* 32.1* 32.6* 32.0*  MCV 94.2  --  95.1 95.9 96.1 95.3 93.8  PLT 176  --  136* 158 153 170 184  < > = values in this interval not displayed. Basic Metabolic Panel:  Recent Labs Lab 05/25/16 0059 05/26/16 0334 05/27/16 0334 05/28/16 0328 05/29/16 0208  NA 138 137 139 138 140  K 3.9 3.8 3.5 3.5 3.7  CL 107 102 98* 94* 92*  CO2 25 27 35* 36* 37*  GLUCOSE 107* 129* 153* 115* 173*  BUN 25* 19 16 16 20   CREATININE 1.15 1.09 1.03 0.89 0.96  CALCIUM 7.6* 8.0* 8.2* 8.4* 8.5*   GFR: Estimated Creatinine Clearance: 83.2 mL/min (by C-G formula based on Cr of 0.96). Liver Function Tests:  Recent Labs Lab 05/24/16 1031 05/25/16 0059  AST 34 26  ALT 30 22  ALKPHOS 58 39  BILITOT 0.8 0.5  PROT 6.8 5.4*  ALBUMIN 3.6 2.8*   No results for input(s): LIPASE, AMYLASE in the last 168 hours. No results for input(s): AMMONIA in the last 168 hours. Coagulation Profile:  Recent Labs Lab 05/24/16 1031 05/24/16 1445  INR 1.26 1.38   Cardiac Enzymes:  Recent Labs Lab 05/25/16 0655 05/25/16 1246 05/25/16 1958 05/29/16 0208 05/29/16 0849  TROPONINI 0.45* 0.48* 1.04* 0.31* 0.30*   BNP (last 3 results) No results for input(s): PROBNP in the last 8760 hours. HbA1C: No results for input(s): HGBA1C in the last 72 hours. CBG:  Recent Labs Lab 05/28/16 0836 05/28/16 1204 05/28/16 1652 05/28/16 2211 05/29/16 0120  GLUCAP 155* 120* 111* 143* 165*   Lipid Profile: No results for input(s): CHOL, HDL, LDLCALC,  TRIG, CHOLHDL, LDLDIRECT in the last 72 hours. Thyroid Function Tests: No results for input(s): TSH, T4TOTAL, FREET4, T3FREE, THYROIDAB in the last 72 hours. Anemia Panel: No results for input(s): VITAMINB12, FOLATE, FERRITIN, TIBC, IRON, RETICCTPCT in the last 72 hours. Sepsis Labs:  Recent Labs Lab 05/24/16 1041 05/24/16 1346 05/24/16 1445 05/24/16 1855  PROCALCITON  --   --  2.68  --   LATICACIDVEN 4.35* 3.27* 2.5* 2.6*    Recent Results (from the past 240 hour(s))  Blood Culture (routine x 2)     Status: Abnormal   Collection Time: 05/24/16 10:31 AM  Result Value Ref Range Status   Specimen Description BLOOD LEFT ANTECUBITAL  Final   Special Requests BOTTLES DRAWN AEROBIC AND ANAEROBIC 5ML  Final   Culture  Setup Time   Final    GRAM POSITIVE COCCI IN CLUSTERS IN PAIRS  IN CHAINS IN BOTH AEROBIC AND ANAEROBIC BOTTLES CRITICAL RESULT CALLED TO, READ BACK BY AND VERIFIED WITH: B GREEN PHARMD 2335 05/24/16 A BROWNING    Culture (A)  Final    GROUP B STREP(S.AGALACTIAE)ISOLATED SUSCEPTIBILITIES PERFORMED ON PREVIOUS CULTURE WITHIN THE LAST 5 DAYS. Performed at [facility]    Report Status 05/27/2016 FINAL  Final  Blood Culture (routine x 2)     Status: Abnormal   Collection Time: 05/24/16 10:42 AM  Result Value Ref Range Status   Specimen Description BLOOD LEFT WRIST  Final   Special Requests BOTTLES DRAWN AEROBIC AND ANAEROBIC 5ML  Final   Culture  Setup Time   Final    GRAM POSITIVE COCCI IN CLUSTERS IN PAIRS IN CHAINS IN BOTH AEROBIC AND ANAEROBIC BOTTLES CRITICAL RESULT CALLED TO, READ BACK BY AND VERIFIED WITH: B GREEN PHARMD 2335 05/24/16 A BROWNING Performed at [place] (A)  Final   Report Status 05/27/2016 FINAL  Final   Organism ID, Bacteria STREPTOCOCCUS AGALACTIAE  Final      Susceptibility   Streptococcus agalactiae - MIC*    CLINDAMYCIN >=1 RESISTANT Resistant     AMPICILLIN <=0.25 SENSITIVE Sensitive      ERYTHROMYCIN >=8 RESISTANT Resistant     VANCOMYCIN 0.5 SENSITIVE Sensitive     CEFTRIAXONE <=0.12 SENSITIVE Sensitive     LEVOFLOXACIN 1 SENSITIVE Sensitive     * STREPTOCOCCUS AGALACTIAE  Blood Culture ID Panel (Reflexed)     Status: Abnormal   Collection Time: 05/24/16 10:42 AM  Result Value Ref Range Status   Enterococcus species NOT DETECTED NOT DETECTED Final   Vancomycin resistance NOT DETECTED NOT DETECTED Final   Listeria monocytogenes NOT DETECTED NOT DETECTED Final   Staphylococcus species NOT DETECTED NOT DETECTED Final   Staphylococcus aureus NOT DETECTED NOT DETECTED Final   Methicillin resistance NOT DETECTED NOT DETECTED Final   Streptococcus species DETECTED (A) NOT DETECTED Final    Comment: CRITICAL RESULT CALLED TO, READ BACK BY AND VERIFIED WITH: B GREEN PHARMD 2335 05/24/16 A BROWNING    Streptococcus agalactiae DETECTED (A) NOT DETECTED Final    Comment: CRITICAL RESULT CALLED TO, READ BACK BY AND VERIFIED WITH: B GREEN PHARMD 2335 05/24/16 A BROWNING    Streptococcus pneumoniae NOT DETECTED NOT DETECTED Final   Streptococcus pyogenes NOT DETECTED NOT DETECTED Final   Acinetobacter baumannii NOT DETECTED NOT DETECTED Final   Enterobacteriaceae species NOT DETECTED NOT DETECTED Final   Enterobacter cloacae complex NOT DETECTED NOT DETECTED Final   Escherichia coli NOT DETECTED NOT DETECTED Final   Klebsiella oxytoca NOT DETECTED NOT DETECTED Final   Klebsiella pneumoniae NOT DETECTED NOT DETECTED Final   Proteus species NOT DETECTED NOT DETECTED Final   Serratia marcescens NOT DETECTED NOT DETECTED Final   Carbapenem resistance NOT DETECTED NOT DETECTED Final   Haemophilus influenzae NOT DETECTED NOT DETECTED Final   Neisseria meningitidis NOT DETECTED NOT DETECTED Final   Pseudomonas aeruginosa NOT DETECTED NOT DETECTED Final   Candida albicans NOT DETECTED NOT DETECTED Final   Candida glabrata NOT DETECTED NOT DETECTED Final   Candida krusei NOT  DETECTED NOT DETECTED Final   Candida parapsilosis NOT DETECTED NOT DETECTED Final   Candida tropicalis NOT DETECTED NOT DETECTED Final    Comment: Performed at [facility]  Urine culture     Status: Abnormal   Collection Time: 05/24/16 12:25 PM  Result Value Ref Range Status   Specimen Description URINE,  CATHETERIZED  Final   Special Requests NONE  Final   Culture (A)  Final    <10,000 COLONIES/mL INSIGNIFICANT GROWTH Performed at [facility]    Report Status 05/25/2016 FINAL  Final  MRSA PCR Screening     Status: None   Collection Time: 05/24/16  2:10 PM  Result Value Ref Range Status   MRSA by PCR NEGATIVE NEGATIVE Final    Comment:        The GeneXpert MRSA Assay (FDA approved for NASAL specimens only), is one component of a comprehensive MRSA colonization surveillance program. It is not intended to diagnose MRSA infection nor to guide or monitor treatment for MRSA infections.   Culture, blood (Routine X 2) w Reflex to ID Panel     Status: None (Preliminary result)   Collection Time: 05/25/16 12:46 PM  Result Value Ref Range Status   Specimen Description BLOOD RIGHT HAND  Final   Special Requests BOTTLES DRAWN AEROBIC AND ANAEROBIC 5CC  Final   Culture   Final    NO GROWTH 3 DAYS Performed at [facility]    Report Status PENDING  Incomplete  Culture, blood (Routine X 2) w Reflex to ID Panel     Status: None (Preliminary result)   Collection Time: 05/25/16 12:46 PM  Result Value Ref Range Status   Specimen Description BLOOD LEFT HAND  Final   Special Requests BOTTLES DRAWN AEROBIC AND ANAEROBIC 5CC  Final   Culture   Final    NO GROWTH 3 DAYS Performed at [facility]    Report Status PENDING  Incomplete         Radiology Studies: Ct Angio Chest Pe W Or Wo Contrast  05/29/2016  CLINICAL DATA:  70 year old male with hypoxia. EXAM: CT ANGIOGRAPHY CHEST WITH CONTRAST TECHNIQUE: Multidetector CT imaging of the chest was performed  using the standard protocol during bolus administration of intravenous contrast. Multiplanar CT image reconstructions and MIPs were obtained to evaluate the vascular anatomy. CONTRAST:  100 cc Isovue 370 COMPARISON:  Chest radiograph dated 05/27/2016 FINDINGS: Small bilateral pleural effusion with associated partial consolidative changes of the right lower lobes which may represent atelectasis versus pneumonia. There is emphysematous changes of the lungs with interstitial thickening primarily involving the upper lobes likely all chronic. There is no pneumothorax. The central airways are patent. There is atherosclerotic calcification of the thoracic aorta. No dissection or aneurysm. The origins of the great vessels of the aortic arch appear patent. Evaluation of the pulmonary arteries is limited due to respiratory motion artifact and suboptimal visualization of the peripheral branches. No definite pulmonary artery embolus identified. There is mild cardiomegaly. No pericardial effusion. There is coronary vascular calcification. No hilar or mediastinal adenopathy. Esophagus is grossly unremarkable. No thyroid nodules identified. There is no axillary adenopathy. The chest wall soft tissues appear unremarkable. There is osteopenia with degenerative changes of the spine. There are innumerable osseous sclerotic lesions most concerning for metastatic disease, likely related to prostate cancer. An 8 mm enhancing focus in the dome of the liver (series 4, image 79) is not well characterized but may represent a flash filling hemangioma or portal venous shunting. MRI without and with contrast is recommended for further characterization. Review of the MIP images confirms the above findings. IMPRESSION: No CT evidence of pulmonary embolism. Small bilateral pleural effusions with partial consolidative changes of the lower lobes, atelectasis versus infiltrate. Emphysema with chronic interstitial changes, predominantly in the upper  lobes. Extensive osseous metastatic disease.  Small enhancing focus at the dome of the liver, incompletely characterized. MRI is recommended for further evaluation. Electronically Signed   By: Anner Crete M.D.   On: 05/29/2016 02:47        Scheduled Meds: . antiseptic oral rinse  7 mL Mouth Rinse BID  . aspirin  81 mg Oral Daily  . carvedilol  6.25 mg Oral BID WC  . enoxaparin (LOVENOX) injection  40 mg Subcutaneous Q24H  . insulin aspart  0-15 Units Subcutaneous TID WC  . lisinopril  5 mg Oral Daily  . pencillin G potassium IV  4 Million Units Intravenous Q4H   Continuous Infusions:    LOS: 5 days    23 minutes    Mir Marry Guan, MD [facility] Pager [phone]  If 7PM-7AM, please contact night-coverage [email or web address] Password [email or web address] 05/29/2016, 1:12 PM

## 2016-05-29 NOTE — Progress Notes (Addendum)
Pt found to be ashen and have AMS by RN. O2 checked, 75% on 2L. Oxygen increased to 6 L with sats coming up to 91%. Rapid response was called. Vitals taken, EKG obtained, Resp paged, labs drawn. MD paged. Pt continued to be ashen in color and was diaphoretic. Pt who was previously A&O x 4 was now not communicating or following commands. Pt agitated, thrashing his arms, and had occassional moans/groans. MD assessed, new orders given, Pt sent to stepdown.

## 2016-05-29 NOTE — Progress Notes (Signed)
   SUBJECTIVE: Confused at diaphoretic overnight.  Moved to ICU.  SOB seems stable.   At this time, he denies chest pain or any new concerns.  Marland Kitchen antiseptic oral rinse  7 mL Mouth Rinse BID  . aspirin  81 mg Oral Daily  . carvedilol  6.25 mg Oral BID WC  . enoxaparin (LOVENOX) injection  40 mg Subcutaneous Q24H  . insulin aspart  0-15 Units Subcutaneous TID WC  . lisinopril  5 mg Oral Daily  . pencillin G potassium IV  4 Million Units Intravenous Q4H      OBJECTIVE: Physical Exam: Filed Vitals:   05/29/16 0500 05/29/16 0600 05/29/16 0700 05/29/16 0800  BP: 114/85 139/85 122/80 152/69  Pulse: 93 91 110 95  Temp:    98.2 F (36.8 C)  TempSrc:    Oral  Resp: 23 24 26 25   Height:      Weight:      SpO2: 97% 96% 92% 93%    Intake/Output Summary (Last 24 hours) at 05/29/16 0910 Last data filed at 05/29/16 0840  Gross per 24 hour  Intake   1050 ml  Output   3350 ml  Net  -2300 ml    Telemetry reveals sinus rhythm  GEN- The patient is ill appearing, alert but confused this am Head- normocephalic, atraumatic Eyes-  Sclera clear, conjunctiva pink Ears- hearing intact Oropharynx- clear Neck- supple, + JVD Lungs- decreased BS at bases, normal work of breathing Heart- Regular rate and rhythm  GI- soft, NT, ND, + BS Extremities- no clubbing, cyanosis, + dependant edema, legs are wrapped Skin- no rash or lesion Psych- euthymic mood, full affect Neuro- strength and sensation are intact  LABS: Basic Metabolic Panel:  Recent Labs  05/28/16 0328 05/29/16 0208  NA 138 140  K 3.5 3.7  CL 94* 92*  CO2 36* 37*  GLUCOSE 115* 173*  BUN 16 20  CREATININE 0.89 0.96  CALCIUM 8.4* 8.5*   CBC:  Recent Labs  05/27/16 0334 05/28/16 0328 05/29/16 0208  WBC 9.6 8.3 9.5  NEUTROABS 8.0* 6.5  --   HGB 10.6* 10.9* 10.8*  HCT 32.1* 32.6* 32.0*  MCV 96.1 95.3 93.8  PLT 153 170 184   Cardiac Enzymes:  Recent Labs  05/29/16 0208  TROPONINI 0.31*    ASSESSMENT AND PLAN:    1. Sepsis/ bacteremia: 2/2 to bacteremia. Management Per IM.  TEE early next week as schedule allows (per Dr Debara Pickett, schedule already filled Monday and Tuesday)  2. Acute Diastolic CHF: patient remains volume overloaded on exam today.  Would continue IV diuresis over the weekend.  Strict Is and Os and daily weights.  Ace inhibitor added yesterday.  Titrate as able.  3. Elevated Troponin:  Likely due to demand ischemia in the setting of sepsis/ bacteremia.  Will follow.  Consider myoview once clinically improved (possibly electively after discharge)  4. HTN Continue to titrate ace inhibitor/ beta blocker as BP allows  Thompson Grayer, MD 05/29/2016 9:10 AM

## 2016-05-29 NOTE — Significant Event (Signed)
Called to floor by nursing for patient with AMS and agitation.  Apparently patient had desaturated on 2L [place] down to the 70s.  They were able to rescue him and his O2 requirement is now up to 6L (this is a change I am told).  He is currently being worked up for bacteremia with sepsis due to S. Agalactiae detected in 2/2 BCx on the 11th.  Repeat BCx on the 12 were no growth.  His ABx is Pen G.  His mental status has improved since his initial agitation, he is now answering questions, MAE, has non-focal neurologic exam, tongue protrusion midline, smile is equal without facial droop etc, following commands, still a little drowsy.  ABG shows what appears to be a well compensated respiratory acidosis with Ph of 7.39.  His legs have BLE swelling, edema, and erythema.  This is believed to be cellulitis related to the Strep.  Given the new O2 requirement (and he was tachycardic up to the 110s at that time), and persistent confusion (patient oriented to self only) I will: 1) transfer to SDU 2) order CTA PE study to r/o embolism 3) re-check troponin (although he denies chest pain or pain anywhere at this time) 4) Repeat EKG does not appear to show significant new pathology (no obvious Stemi or heart block). 5) Ordering am labs for now (CBC, BMP)

## 2016-05-30 LAB — COMPREHENSIVE METABOLIC PANEL
ALBUMIN: 2.9 g/dL — AB (ref 3.5–5.0)
ALT: 40 U/L (ref 17–63)
AST: 37 U/L (ref 15–41)
Alkaline Phosphatase: 46 U/L (ref 38–126)
Anion gap: 7 (ref 5–15)
BUN: 19 mg/dL (ref 6–20)
CHLORIDE: 92 mmol/L — AB (ref 101–111)
CO2: 39 mmol/L — AB (ref 22–32)
CREATININE: 0.87 mg/dL (ref 0.61–1.24)
Calcium: 8.6 mg/dL — ABNORMAL LOW (ref 8.9–10.3)
GFR calc Af Amer: >60 mL/min (ref >60)
GLUCOSE: 136 mg/dL — AB (ref 65–99)
POTASSIUM: 3.4 mmol/L — AB (ref 3.5–5.1)
SODIUM: 138 mmol/L (ref 135–145)
Total Bilirubin: 0.8 mg/dL (ref 0.3–1.2)
Total Protein: 6.5 g/dL (ref 6.5–8.1)

## 2016-05-30 LAB — CBC
HCT: 32.1 % — ABNORMAL LOW (ref 39.0–52.0)
Hemoglobin: 10.8 g/dL — ABNORMAL LOW (ref 13.0–17.0)
MCH: 31.6 pg (ref 26.0–34.0)
MCHC: 33.6 g/dL (ref 30.0–36.0)
MCV: 93.9 fL (ref 78.0–100.0)
PLATELETS: 237 10*3/uL (ref 150–400)
RBC: 3.42 MIL/uL — AB (ref 4.22–5.81)
RDW: 13.6 % (ref 11.5–15.5)
WBC: 10.8 10*3/uL — AB (ref 4.0–10.5)

## 2016-05-30 LAB — CULTURE, BLOOD (ROUTINE X 2)
CULTURE: NO GROWTH
Culture: NO GROWTH

## 2016-05-30 LAB — GLUCOSE, CAPILLARY
GLUCOSE-CAPILLARY: 112 mg/dL — AB (ref 65–99)
GLUCOSE-CAPILLARY: 167 mg/dL — AB (ref 65–99)
GLUCOSE-CAPILLARY: 143 mg/dL — AB (ref 65–99)
GLUCOSE-CAPILLARY: 122 mg/dL — AB (ref 65–99)
GLUCOSE-CAPILLARY: 118 mg/dL — AB (ref 65–99)
Glucose-Capillary: 116 mg/dL — ABNORMAL HIGH (ref 65–99)
Glucose-Capillary: 122 mg/dL — ABNORMAL HIGH (ref 65–99)
Glucose-Capillary: 157 mg/dL — ABNORMAL HIGH (ref 65–99)
Glucose-Capillary: 117 mg/dL — ABNORMAL HIGH (ref 65–99)

## 2016-05-30 MED ORDER — PRO-STAT SUGAR FREE PO LIQD
30.0000 mL | Freq: Two times a day (BID) | ORAL | Status: DC
  Administered 2016-05-30 – 2016-06-04 (×8): 30 mL via ORAL
  Filled 2016-05-30 (×8): qty 30

## 2016-05-30 MED ORDER — ENSURE ENLIVE PO LIQD: 237.0000 mL | Freq: Two times a day (BID) | ORAL | Status: DC

## 2016-05-30 MED ORDER — LORAZEPAM 2 MG/ML IJ SOLN: 0.5000 mg | Freq: Once | INTRAMUSCULAR | Status: DC

## 2016-05-30 MED ORDER — FUROSEMIDE 10 MG/ML IJ SOLN
40.0000 mg | Freq: Two times a day (BID) | INTRAMUSCULAR | Status: DC
  Administered 2016-05-30 – 2016-06-02 (×7): 40 mg via INTRAVENOUS
  Filled 2016-05-30 (×7): qty 4

## 2016-05-30 MED ORDER — POTASSIUM CHLORIDE CRYS ER 20 MEQ PO TBCR
40.0000 meq | EXTENDED_RELEASE_TABLET | Freq: Every day | ORAL | Status: DC
  Administered 2016-05-30 – 2016-06-04 (×6): 40 meq via ORAL
  Filled 2016-05-30 (×6): qty 2

## 2016-05-30 MED ORDER — SODIUM CHLORIDE 0.9 % IV SOLN: Freq: Once | INTRAVENOUS | Status: DC

## 2016-05-30 NOTE — Progress Notes (Signed)
Patient Profile: 70 y.o. male with medical history significant of metastatic prostate cancer, DM, CAD, PVD who presents with AMS, fever/ sepsis 2/2 bilateral LE cellulitis. He now has + positive blood cultures growing gram positive cocci, streptococcus species. Cardiology consulted for CHF, + troponin and evaluation for TEE.   Interval history: patient was transferred back to ICU for hypoxia + AMS. Head CT 7/16 unremarkable. Chest CT 7/16 negative for PE. Small bilateral pleural effusion with associated partial consolidative changes of the right lower lobes which may represent atelectasis versus pneumonia.  Subjective: He had a "rough couple of days" over the weekend. He denies CP. Still with orthopnea.   Objective: Vital signs in last 24 hours: Temp:  [98.5 F (36.9 C)-99.7 F (37.6 C)] 98.5 F (36.9 C) (07/17 0400) Pulse Rate:  [79-100] 85 (07/17 0600) Resp:  [18-28] 26 (07/17 0600) BP: (124-184)/(54-102) 183/70 mmHg (07/17 0400) SpO2:  [85 %-100 %] 100 % (07/17 0600) Last BM Date: 05/28/16  Intake/Output from previous day: 07/16 0701 - 07/17 0700 In: 1710 [IV Piggyback:1500] Out: 825 [Urine:825] Intake/Output this shift:    Medications Current Facility-Administered Medications  Medication Dose Route Frequency Provider Last Rate Last Dose  . acetaminophen (TYLENOL) tablet 650 mg  650 mg Oral Q6H PRN Belkys A Regalado, MD   650 mg at 05/27/16 2130  . antiseptic oral rinse (CPC / CETYLPYRIDINIUM CHLORIDE 0.05%) solution 7 mL  7 mL Mouth Rinse BID Lily Kocher, MD   7 mL at 05/29/16 2200  . aspirin chewable tablet 81 mg  81 mg Oral Daily Thompson Grayer, MD   81 mg at 05/28/16 0956  . carvedilol (COREG) tablet 6.25 mg  6.25 mg Oral BID WC Thompson Grayer, MD   6.25 mg at 05/30/16 0800  . enoxaparin (LOVENOX) injection 40 mg  40 mg Subcutaneous Q24H Belkys A Regalado, MD   40 mg at 05/29/16 1734  . furosemide (LASIX) injection 40 mg  40 mg Intravenous BID Mir Marry Guan, MD    40 mg at 05/30/16 0800  . insulin aspart (novoLOG) injection 0-15 Units  0-15 Units Subcutaneous TID WC Belkys A Regalado, MD   2 Units at 05/29/16 1734  . lisinopril (PRINIVIL,ZESTRIL) tablet 5 mg  5 mg Oral Daily Thompson Grayer, MD   5 mg at 05/28/16 0956  . LORazepam (ATIVAN) injection 0.5 mg  0.5 mg Intravenous Once Hewitt Shorts [place], PA-C   0.5 mg at 05/30/16 0230  . ondansetron (ZOFRAN) injection 4 mg  4 mg Intravenous Q6H PRN Belkys A Regalado, MD   4 mg at 05/27/16 0829  . penicillin G potassium 4 Million Units in dextrose 5 % 250 mL IVPB  4 Million Units Intravenous Q4H Tawni Millers, MD   4 Million Units at 05/30/16 [phone]  . potassium chloride SA (K-DUR,KLOR-CON) CR tablet 40 mEq  40 mEq Oral Daily Mir Marry Guan, MD        PE: Blood pressure 120/50, pulse 110, temperature 99.2 F (37.3 C), temperature source Oral, resp. rate 26, height 5\' 5"  (1.651 m), weight 264 lb 1.8 oz (119.8 kg), SpO2 100 %.  General: Pleasant, NAD, obese and edematous  Psych: Normal affect. Neuro: Alert and oriented X 3. Moves all extremities spontaneously. HEENT: Normal Neck: Supple without bruits or JVD. Lungs: Resp regular and unlabored, decreased BS at the bases  Heart: RRR no s3, s4, or murmurs. Abdomen: Soft, non-tender, non-distended, BS + x 4.  Extremities: upper extremities are edematous, bilateral LEs wrapped.  DP/PT/Radials 2+ and equal bilaterally.  Lab Results:   Recent Labs  05/28/16 0328 05/29/16 0208 05/30/16 0321  WBC 8.3 9.5 10.8*  HGB 10.9* 10.8* 10.8*  HCT 32.6* 32.0* 32.1*  PLT 170 184 237   BMET  Recent Labs  05/28/16 0328 05/29/16 0208 05/30/16 0321  NA 138 140 138  K 3.5 3.7 3.4*  CL 94* 92* 92*  CO2 36* 37* 39*  GLUCOSE 115* 173* 136*  BUN 16 20 19   CREATININE 0.89 0.96 0.87  CALCIUM 8.4* 8.5* 8.6*   PT/INR No results for input(s): LABPROT, INR in the last 72 hours. Cardiac Panel (last 3 results)  Recent Labs  05/29/16 0208  05/29/16 0849  TROPONINI 0.31* 0.30*    Studies/Results: 2D Echo 05/25/16 Study Conclusions  - Left ventricle: The cavity size was normal. There was moderate  focal basal hypertrophy of the septum. Systolic function was  vigorous. The estimated ejection fraction was in the range of 65%  to 70%. Wall motion was normal; there were no regional wall  motion abnormalities. Features are consistent with a pseudonormal  left ventricular filling pattern, with concomitant abnormal  relaxation and increased filling pressure (grade 2 diastolic  dysfunction). Doppler parameters are consistent with elevated  ventricular end-diastolic filling pressure. - Aortic root: The aortic root was normal in size. - Mitral valve: Calcified annulus. Mildly thickened leaflets . - Left atrium: The atrium was mildly dilated. - Right atrium: The atrium was normal in size. - Tricuspid valve: There was mild regurgitation. - Pulmonary arteries: Systolic pressure was within the normal  range. - Inferior vena cava: The vessel was normal in size. - Pericardium, extracardiac: There was no pericardial effusion.  Impressions:  - limited study quality secondary to tachycardia during the  acquisition.   Assessment/Plan  Principal Problem:   Acute respiratory failure with hypoxia (HCC) Active Problems:   Type 2 diabetes mellitus with hyperglycemia (HCC)   Essential hypertension   RBBB   Sleep apnea by history- never followed through with sleep study   CAD (coronary artery disease)   Morbid obesity ([place])   Sepsis ([place])   Acute diastolic (congestive) heart failure (HCC)   Cellulitis of right lower extremity   Dyspnea   2d echo shows left ventricular diastolic dysfunction-grade 2   1. Sepsis: 2/2 to bacteremia. Management Per IM.   2. Bacteremia: 2/2 bilateral LE cellulitis. Blood cultures + for gram positive cocci, streptococcus species. Antibiotics per IM. No significant valvular abnormalities  nor vegetations noted on 2D echo, however we will plan for a TEE this week to better assess cardiac valves to fully r/o endocarditis. We will need to adequately diurese him first as he will need to be able to tolerate supine position for test (has had orthopnea). The TEE schedule is also full for today and tomorrow. He have him scheduled for 7/19 @11  am. Hopefully he will be able to lay flat by then for procedure.   3. Acute Diastolic CHF: patient developed acute respiratory hypoxic failure, however STAT CXR w/o notable pulmonary edema. However he has significant peripheral edema/ anasarca. BNP only 214.  2D Echo this admit demonstrated vigorous LV systolic function with an EF of 65-70%. Grade 2DD was noted. Continue IV Lasix. I/Os net negative 5.4L. Renal function stable. Give supplemental K for hypokalemia and monitor. Monitor I/Os, BP, renal function and electrolytes.   4. Elevated Troponin: 0.11>0.41>>0.45>>0.48>>1.04. This may be secondary to demand ischemia in the setting of sepsis/ bacteremia. 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 He denies any recent CP. He will need repeat ischemic eval at some point. Can consider a myoview once clinically improved (possibly electively after discharge).   5. Hypokalemia: K is 3.4 today, in the setting of IV diuresis. Supplemental K has been ordered. Repeat BMP in the am.    LOS: 6 days    Brittainy M. Rosita Fire, PA-C 05/30/2016 8:07 AM   History and all data above reviewed.  Patient examined.  I agree with the findings as above.  He feels better today than he was feeling.  No acute distress although his breathing would not allow him to lie flat at this point.  The patient exam reveals COR:RRR  ,  Lungs: Decreased breath sounds  ,  Abd: Distended, Ext Diffuse edema.    .  All available labs, radiology testing, previous records reviewed. Agree with documented assessment and plan. Bacteremia:  TEE Wed  tentatively.  Acute diastolic HF.  Continue IV diuresis.  Significant right greater than left volume overload.    Anderia Lorenzo  10:30 AM  05/30/2016

## 2016-05-30 NOTE — Progress Notes (Signed)
Initial Nutrition Assessment  DOCUMENTATION CODES:   Morbid obesity  INTERVENTION:  - Will order Ensure Enlive BID, each supplement provides 350 kcal and 20 grams of protein - Will order 30 mL Prostat BID, each supplement provides 100 kcal and 15 grams of protein. - Encourage PO intakes of meals and supplements. - RD will continue to monitor for additional needs.  NUTRITION DIAGNOSIS:   Inadequate oral intake related to poor appetite as evidenced by per patient/family report, meal completion < 50%.  GOAL:   Patient will meet greater than or equal to 90% of their needs  MONITOR:   PO intake, Supplement acceptance, Weight trends, Labs, Skin, I & O's  REASON FOR ASSESSMENT:   Low Braden  ASSESSMENT:   70 y.o. male with medical history significant of metastatic prostate cancer, DM, CAD, PVD who presents with AMS, fever. Per report patient has been confuse,sleepy for last day. His confusion and lethargic has improved after fluids and treatment for fever in the ED. He report Bilateral lower extremities redness, edema and drainage for last 3 weeks. He saw his PCP 3 weeks ago and was prescribe doxycycline. He finished course and saw PCP 1 week ago and was recommended to do locar care,rapping of the legs. He relates that redness and drainage has persisted for last 3 weeks.he develops fever 3 days ago. He also relates dyspnea,worse when he lying down flat,for years.   Pt seen for low Braden. BMI indicates morbid obesity. No intakes documented since admission. Pt reports he ate <50% of breakfast this AM which consisted of [place] toast, bacon, banana, and fruit cup. Pt states that he cannot recall other meals other than breakfast on day of admission at which time he ate approximately the same amount. Pt denies abdominal pain or nausea at this time. Pt denies any chewing or swallowing difficulties at baseline.   Pt is slightly confused during discussion; unable to obtain PTA information at this  time. Pt is also HOH. Physical assessment indicates no muscle or fat wasting, mild edema to upper body and moderate edema to lower body. Pt denies weight loss or weight gain PTA. Per chart review, weight stable x1 month PTA and pt has gained ~8 lbs since August 2016.  Pt not meeting needs at this time. Will order supplements as outlined above and continue to monitor for need for adjustment/additional needs.  Medications reviewed; sliding scale Novolog, 40 mg IV Lasix BID, 40 mEq oral KCl/day. Labs reviewed; CBGs: 143-165 mg/dL this AM, K: 3.4 mmol/L, Ca: 8.6 mg/dL,    Diet Order:  Diet Carb Modified Fluid consistency:: Thin; Room service appropriate?: Yes Diet NPO time specified Except for: Sips with Meds  Skin:  Wound (see comment) (BLE cellulitis)  Last BM:  7/17  Height:   Ht Readings from Last 1 Encounters:  05/28/16 5\' 5"  (1.651 m)    Weight:   Wt Readings from Last 1 Encounters:  05/28/16 255 lb 8 oz (115.894 kg)    Ideal Body Weight:  61.82 kg (kg)  BMI:  Body mass index is 42.52 kg/(m^2).  Estimated Nutritional Needs:   Kcal:  2050-2250  Protein:  115-125 grams  Fluid:  2 L/day  EDUCATION NEEDS:   No education needs identified at this time     Jarome Matin, MS, RD, LDN Inpatient Clinical Dietitian Pager # [phone] After hours/weekend pager # [phone]

## 2016-05-30 NOTE — Progress Notes (Signed)
PROGRESS NOTE    Theodore [name]  [id number] DOB: 01/27/1946 DOA: 05/24/2016 PCP: [name], FNP   Brief Narrative: 70 yo gentleman admitted to the stepdown unit for management of sepsis secondary to bilateral lower extremity cellulitis. Lactic acid 4, WBC 20 at time of admission. Procalcitonin 2.68. Positive for grup B strep bacteremia, will need TEE this week. Volume overloaded, on diuresis with good response with cardiology following. Transfer back to stepdown 7/15 PM for hypoxia and AMS. Now improving.  Assessment & Plan:   Principal Problem:   Acute respiratory failure with hypoxia (HCC) Active Problems:   Type 2 diabetes mellitus with hyperglycemia (HCC)   Essential hypertension   RBBB   Sleep apnea by history- never followed through with sleep study   CAD (coronary artery disease)   Morbid obesity ([place])   Sepsis ([place])   Acute diastolic (congestive) heart failure (HCC)   Cellulitis of right lower extremity   Dyspnea   2d echo shows left ventricular diastolic dysfunction-grade 2  1. Cardiovascular. Sepsis Due to bilateral lower extremity cellulitis, blood cultures positive for streptococcus agalactiae. Antibiotic to penicillin 7/15.  TTE with normal LV function with no reported vegetation. Noted diastolic heart failure probably chronic. Continue metoprolol. Needs TEE when more euvolemic, will continue diuresis with furosemide. Cardiology following.  2. Pulmonary. Pulmonary edema Still with O2 requirement (down from 6L to 5L today), continue diuresis with furosemide, good urine output, continue oxymetry monitor and oxygen supplementation per [place].   3. Nephrology. Renal function with stable cr, follow renal panel in AM will cont furosemide bid for now.   4. Skin. Bilateral cellulitis, will continue local wound care, pain control.   5. Neuro. Metabolic encephalopathy sudden mental status change 7/15 PM, transferred to ICU. He's less confused and improving already,  unclear etiology of his confusion. Ammonia normal. Blood gas with CO2 of 60. Continue supportive care for now. Head CT with no acute findings on 7/16.  Patient at moderate risk for worsening sepsis.    DVT prophylaxis: Lovenox Code Status: FULL CODE Family Communication: I spoke with patient's wife at the bedside in ICU today and all questions were addressed. Disposition Plan: Likely to home at d/c in 3-5 days. Monitor in ICU today. Discussed with bedside RN this AM as well.   Consultants:   Cardiology  Procedures: Head CT 7/16 was without acute change.  Antimicrobials:  Vancomycin/Zosyn change to PCN G 7/15.   Subjective: Speaking much more clearly this AM. Denies pain, SOB, cough, nausea. Wife and daughter at bedside this AM and all questions answered.  Objective: Filed Vitals:   05/30/16 0400 05/30/16 0500 05/30/16 0600 05/30/16 0800  BP: 183/70   183/85  Pulse: 79 82 85 79  Temp: 98.5 F (36.9 C)     TempSrc: Oral     Resp: 21 25 26 22   Height:      Weight:      SpO2: 100% 100% 100% 100%    Intake/Output Summary (Last 24 hours) at 05/30/16 0922 Last data filed at 05/30/16 0804  Gross per 24 hour  Intake   1680 ml  Output    825 ml  Net    855 ml   Filed Weights   05/26/16 0600 05/27/16 0401 05/28/16 1236  Weight: 118.8 kg (261 lb 14.5 oz) 118.434 kg (261 lb 1.6 oz) 115.894 kg (255 lb 8 oz)    Examination:  General exam: deconditioned and ill looking appearing, speaking clearly today. Knows where he is and  recognizes his wife. E ENT: conjunctival pallor with dry oral mucosa. Respiratory system: Decreased breath sounds at bases. No wheezing, rales or rhonchi. Anterior auscultation on supine position.  Cardiovascular system: S1 & S2 heard, RRR. No JVD, murmurs, rubs, gallops or clicks.  Lower extremities with compression bandages, upper thigh with 1+ pitting edema, upper ext, positive non pitting edema.  Gastrointestinal system: Abdomen protuberant but  nondistended, soft and nontender. No organomegaly or masses felt. Normal bowel sounds heard. Central nervous system: Somnolent. No focal neurological deficits. Extremities: Symmetric 5 x 5 power. Skin: No rashes, lesions or ulcers      Data Reviewed: I have personally reviewed following labs and imaging studies  CBC:  Recent Labs Lab 05/24/16 1031  05/26/16 0334 05/27/16 0334 05/28/16 0328 05/29/16 0208 05/30/16 0321  WBC 20.9*  < > 11.9* 9.6 8.3 9.5 10.8*  NEUTROABS 17.9*  --  10.1* 8.0* 6.5  --   --   HGB 12.1*  < > 10.8* 10.6* 10.9* 10.8* 10.8*  HCT 35.7*  < > 32.7* 32.1* 32.6* 32.0* 32.1*  MCV 94.2  < > 95.9 96.1 95.3 93.8 93.9  PLT 176  < > 158 153 170 184 237  < > = values in this interval not displayed. Basic Metabolic Panel:  Recent Labs Lab 05/26/16 0334 05/27/16 0334 05/28/16 0328 05/29/16 0208 05/30/16 0321  NA 137 139 138 140 138  K 3.8 3.5 3.5 3.7 3.4*  CL 102 98* 94* 92* 92*  CO2 27 35* 36* 37* 39*  GLUCOSE 129* 153* 115* 173* 136*  BUN 19 16 16 20 19   CREATININE 1.09 1.03 0.89 0.96 0.87  CALCIUM 8.0* 8.2* 8.4* 8.5* 8.6*   GFR: Estimated Creatinine Clearance: 91.8 mL/min (by C-G formula based on Cr of 0.87). Liver Function Tests:  Recent Labs Lab 05/24/16 1031 05/25/16 0059 05/30/16 0321  AST 34 26 37  ALT 30 22 40  ALKPHOS 58 39 46  BILITOT 0.8 0.5 0.8  PROT 6.8 5.4* 6.5  ALBUMIN 3.6 2.8* 2.9*   No results for input(s): LIPASE, AMYLASE in the last 168 hours.  Recent Labs Lab 05/29/16 1350  AMMONIA 23   Coagulation Profile:  Recent Labs Lab 05/24/16 1031 05/24/16 1445  INR 1.26 1.38   Cardiac Enzymes:  Recent Labs Lab 05/25/16 0655 05/25/16 1246 05/25/16 1958 05/29/16 0208 05/29/16 0849  TROPONINI 0.45* 0.48* 1.04* 0.31* 0.30*   BNP (last 3 results) No results for input(s): PROBNP in the last 8760 hours. HbA1C: No results for input(s): HGBA1C in the last 72 hours. CBG:  Recent Labs Lab 05/28/16 2211  05/29/16 0120 05/29/16 0345 05/30/16 0147 05/30/16 0817  GLUCAP 143* 165* 149* 143* 157*   Lipid Profile: No results for input(s): CHOL, HDL, LDLCALC, TRIG, CHOLHDL, LDLDIRECT in the last 72 hours. Thyroid Function Tests: No results for input(s): TSH, T4TOTAL, FREET4, T3FREE, THYROIDAB in the last 72 hours. Anemia Panel: No results for input(s): VITAMINB12, FOLATE, FERRITIN, TIBC, IRON, RETICCTPCT in the last 72 hours. Sepsis Labs:  Recent Labs Lab 05/24/16 1041 05/24/16 1346 05/24/16 1445 05/24/16 1855  PROCALCITON  --   --  2.68  --   LATICACIDVEN 4.35* 3.27* 2.5* 2.6*    Recent Results (from the past 240 hour(s))  Blood Culture (routine x 2)     Status: Abnormal   Collection Time: 05/24/16 10:31 AM  Result Value Ref Range Status   Specimen Description BLOOD LEFT ANTECUBITAL  Final   Special Requests BOTTLES DRAWN AEROBIC AND ANAEROBIC 5ML  Final   Culture  Setup Time   Final    GRAM POSITIVE COCCI IN CLUSTERS IN PAIRS IN CHAINS IN BOTH AEROBIC AND ANAEROBIC BOTTLES CRITICAL RESULT CALLED TO, READ BACK BY AND VERIFIED WITH: B GREEN PHARMD 2335 05/24/16 A BROWNING    Culture (A)  Final    GROUP B STREP(S.AGALACTIAE)ISOLATED SUSCEPTIBILITIES PERFORMED ON PREVIOUS CULTURE WITHIN THE LAST 5 DAYS. Performed at [facility]    Report Status 05/27/2016 FINAL  Final  Blood Culture (routine x 2)     Status: Abnormal   Collection Time: 05/24/16 10:42 AM  Result Value Ref Range Status   Specimen Description BLOOD LEFT WRIST  Final   Special Requests BOTTLES DRAWN AEROBIC AND ANAEROBIC 5ML  Final   Culture  Setup Time   Final    GRAM POSITIVE COCCI IN CLUSTERS IN PAIRS IN CHAINS IN BOTH AEROBIC AND ANAEROBIC BOTTLES CRITICAL RESULT CALLED TO, READ BACK BY AND VERIFIED WITH: B GREEN PHARMD 2335 05/24/16 A BROWNING Performed at [place] (A)  Final   Report Status 05/27/2016 FINAL  Final   Organism ID, Bacteria  STREPTOCOCCUS AGALACTIAE  Final      Susceptibility   Streptococcus agalactiae - MIC*    CLINDAMYCIN >=1 RESISTANT Resistant     AMPICILLIN <=0.25 SENSITIVE Sensitive     ERYTHROMYCIN >=8 RESISTANT Resistant     VANCOMYCIN 0.5 SENSITIVE Sensitive     CEFTRIAXONE <=0.12 SENSITIVE Sensitive     LEVOFLOXACIN 1 SENSITIVE Sensitive     * STREPTOCOCCUS AGALACTIAE  Blood Culture ID Panel (Reflexed)     Status: Abnormal   Collection Time: 05/24/16 10:42 AM  Result Value Ref Range Status   Enterococcus species NOT DETECTED NOT DETECTED Final   Vancomycin resistance NOT DETECTED NOT DETECTED Final   Listeria monocytogenes NOT DETECTED NOT DETECTED Final   Staphylococcus species NOT DETECTED NOT DETECTED Final   Staphylococcus aureus NOT DETECTED NOT DETECTED Final   Methicillin resistance NOT DETECTED NOT DETECTED Final   Streptococcus species DETECTED (A) NOT DETECTED Final    Comment: CRITICAL RESULT CALLED TO, READ BACK BY AND VERIFIED WITH: B GREEN PHARMD 2335 05/24/16 A BROWNING    Streptococcus agalactiae DETECTED (A) NOT DETECTED Final    Comment: CRITICAL RESULT CALLED TO, READ BACK BY AND VERIFIED WITH: B GREEN PHARMD 2335 05/24/16 A BROWNING    Streptococcus pneumoniae NOT DETECTED NOT DETECTED Final   Streptococcus pyogenes NOT DETECTED NOT DETECTED Final   Acinetobacter baumannii NOT DETECTED NOT DETECTED Final   Enterobacteriaceae species NOT DETECTED NOT DETECTED Final   Enterobacter cloacae complex NOT DETECTED NOT DETECTED Final   Escherichia coli NOT DETECTED NOT DETECTED Final   Klebsiella oxytoca NOT DETECTED NOT DETECTED Final   Klebsiella pneumoniae NOT DETECTED NOT DETECTED Final   Proteus species NOT DETECTED NOT DETECTED Final   Serratia marcescens NOT DETECTED NOT DETECTED Final   Carbapenem resistance NOT DETECTED NOT DETECTED Final   Haemophilus influenzae NOT DETECTED NOT DETECTED Final   Neisseria meningitidis NOT DETECTED NOT DETECTED Final   Pseudomonas  aeruginosa NOT DETECTED NOT DETECTED Final   Candida albicans NOT DETECTED NOT DETECTED Final   Candida glabrata NOT DETECTED NOT DETECTED Final   Candida krusei NOT DETECTED NOT DETECTED Final   Candida parapsilosis NOT DETECTED NOT DETECTED Final   Candida tropicalis NOT DETECTED NOT DETECTED Final    Comment: Performed at [facility]  Urine culture  Status: Abnormal   Collection Time: 05/24/16 12:25 PM  Result Value Ref Range Status   Specimen Description URINE, CATHETERIZED  Final   Special Requests NONE  Final   Culture (A)  Final    <10,000 COLONIES/mL INSIGNIFICANT GROWTH Performed at [facility]    Report Status 05/25/2016 FINAL  Final  MRSA PCR Screening     Status: None   Collection Time: 05/24/16  2:10 PM  Result Value Ref Range Status   MRSA by PCR NEGATIVE NEGATIVE Final    Comment:        The GeneXpert MRSA Assay (FDA approved for NASAL specimens only), is one component of a comprehensive MRSA colonization surveillance program. It is not intended to diagnose MRSA infection nor to guide or monitor treatment for MRSA infections.   Culture, blood (Routine X 2) w Reflex to ID Panel     Status: None (Preliminary result)   Collection Time: 05/25/16 12:46 PM  Result Value Ref Range Status   Specimen Description BLOOD RIGHT HAND  Final   Special Requests BOTTLES DRAWN AEROBIC AND ANAEROBIC 5CC  Final   Culture   Final    NO GROWTH 4 DAYS Performed at [facility]    Report Status PENDING  Incomplete  Culture, blood (Routine X 2) w Reflex to ID Panel     Status: None (Preliminary result)   Collection Time: 05/25/16 12:46 PM  Result Value Ref Range Status   Specimen Description BLOOD LEFT HAND  Final   Special Requests BOTTLES DRAWN AEROBIC AND ANAEROBIC 5CC  Final   Culture   Final    NO GROWTH 4 DAYS Performed at [facility]    Report Status PENDING  Incomplete         Radiology Studies: Ct Head Wo  Contrast  05/29/2016  CLINICAL DATA:  Mental status changes. EXAM: CT HEAD WITHOUT CONTRAST TECHNIQUE: Contiguous axial images were obtained from the base of the skull through the vertex without intravenous contrast. COMPARISON:  None. FINDINGS: There is no evidence for acute hemorrhage, hydrocephalus, mass lesion, or abnormal extra-axial fluid collection. No definite CT evidence for acute infarction. Diffuse loss of parenchymal volume is consistent with atrophy. The visualized paranasal sinuses and mastoid air cells are clear. IMPRESSION: No acute intracranial abnormality. Electronically Signed   By: Misty Stanley M.D.   On: 05/29/2016 15:27   Ct Angio Chest Pe W Or Wo Contrast  05/29/2016  CLINICAL DATA:  70 year old male with hypoxia. EXAM: CT ANGIOGRAPHY CHEST WITH CONTRAST TECHNIQUE: Multidetector CT imaging of the chest was performed using the standard protocol during bolus administration of intravenous contrast. Multiplanar CT image reconstructions and MIPs were obtained to evaluate the vascular anatomy. CONTRAST:  100 cc Isovue 370 COMPARISON:  Chest radiograph dated 05/27/2016 FINDINGS: Small bilateral pleural effusion with associated partial consolidative changes of the right lower lobes which may represent atelectasis versus pneumonia. There is emphysematous changes of the lungs with interstitial thickening primarily involving the upper lobes likely all chronic. There is no pneumothorax. The central airways are patent. There is atherosclerotic calcification of the thoracic aorta. No dissection or aneurysm. The origins of the great vessels of the aortic arch appear patent. Evaluation of the pulmonary arteries is limited due to respiratory motion artifact and suboptimal visualization of the peripheral branches. No definite pulmonary artery embolus identified. There is mild cardiomegaly. No pericardial effusion. There is coronary vascular calcification. No hilar or mediastinal adenopathy. Esophagus is  grossly unremarkable. No thyroid nodules identified.  There is no axillary adenopathy. The chest wall soft tissues appear unremarkable. There is osteopenia with degenerative changes of the spine. There are innumerable osseous sclerotic lesions most concerning for metastatic disease, likely related to prostate cancer. An 8 mm enhancing focus in the dome of the liver (series 4, image 79) is not well characterized but may represent a flash filling hemangioma or portal venous shunting. MRI without and with contrast is recommended for further characterization. Review of the MIP images confirms the above findings. IMPRESSION: No CT evidence of pulmonary embolism. Small bilateral pleural effusions with partial consolidative changes of the lower lobes, atelectasis versus infiltrate. Emphysema with chronic interstitial changes, predominantly in the upper lobes. Extensive osseous metastatic disease. Small enhancing focus at the dome of the liver, incompletely characterized. MRI is recommended for further evaluation. Electronically Signed   By: Anner Crete M.D.   On: 05/29/2016 02:47        Scheduled Meds: . antiseptic oral rinse  7 mL Mouth Rinse BID  . aspirin  81 mg Oral Daily  . carvedilol  6.25 mg Oral BID WC  . enoxaparin (LOVENOX) injection  40 mg Subcutaneous Q24H  . furosemide  40 mg Intravenous BID  . insulin aspart  0-15 Units Subcutaneous TID WC  . lisinopril  5 mg Oral Daily  . LORazepam  0.5 mg Intravenous Once  . pencillin G potassium IV  4 Million Units Intravenous Q4H  . potassium chloride  40 mEq Oral Daily   Continuous Infusions:    LOS: 6 days    26 minutes    Mir Marry Guan, MD [facility] Pager [phone]  If 7PM-7AM, please contact night-coverage [email or web address] Password [email or web address] 05/30/2016, 9:22 AM

## 2016-05-30 NOTE — Progress Notes (Signed)
Patient wife at bedside and asked about implanted devices in patient. Wife states that he has penile implant in 1994 and urinary spinchter implant in 2010. Wife said she was never told by surgeon that any of the devices contain metal, but she can "feel and tell they are made of plastic". Patient doesn't have a medical card with device information. Information relayed to MRI technician and because device information cant be verified at this time, MRI will have to be placed on hold. NP on call for [facility] notified by night shift nurse.

## 2016-05-31 ENCOUNTER — Other Ambulatory Visit (HOSPITAL_COMMUNITY): Payer: Medicare Other

## 2016-05-31 DIAGNOSIS — R7881 Bacteremia: Secondary | ICD-10-CM | POA: Insufficient documentation

## 2016-05-31 LAB — CBC
HEMATOCRIT: 32.8 % — AB (ref 39.0–52.0)
HEMOGLOBIN: 11.2 g/dL — AB (ref 13.0–17.0)
MCH: 32 pg (ref 26.0–34.0)
MCHC: 34.1 g/dL (ref 30.0–36.0)
MCV: 93.7 fL (ref 78.0–100.0)
Platelets: 279 10*3/uL (ref 150–400)
RBC: 3.5 MIL/uL — AB (ref 4.22–5.81)
RDW: 13.6 % (ref 11.5–15.5)
WBC: 10.8 10*3/uL — ABNORMAL HIGH (ref 4.0–10.5)

## 2016-05-31 LAB — GLUCOSE, CAPILLARY
GLUCOSE-CAPILLARY: 106 mg/dL — AB (ref 65–99)
Glucose-Capillary: 125 mg/dL — ABNORMAL HIGH (ref 65–99)
Glucose-Capillary: 87 mg/dL (ref 65–99)
Glucose-Capillary: 107 mg/dL — ABNORMAL HIGH (ref 65–99)

## 2016-05-31 LAB — BASIC METABOLIC PANEL
Anion gap: 7 (ref 5–15)
BUN: 19 mg/dL (ref 6–20)
CHLORIDE: 91 mmol/L — AB (ref 101–111)
CO2: 39 mmol/L — AB (ref 22–32)
CREATININE: 0.92 mg/dL (ref 0.61–1.24)
Calcium: 8.8 mg/dL — ABNORMAL LOW (ref 8.9–10.3)
GFR calc Af Amer: >60 mL/min (ref >60)
GFR calc non Af Amer: >60 mL/min (ref >60)
GLUCOSE: 112 mg/dL — AB (ref 65–99)
POTASSIUM: 3.7 mmol/L (ref 3.5–5.1)
SODIUM: 137 mmol/L (ref 135–145)

## 2016-05-31 LAB — C DIFFICILE QUICK SCREEN W PCR REFLEX
C DIFFICILE (CDIFF) INTERP: NOT DETECTED
C DIFFICILE (CDIFF) TOXIN: NEGATIVE
C DIFFICLE (CDIFF) ANTIGEN: NEGATIVE

## 2016-05-31 NOTE — Progress Notes (Signed)
Patient Profile: 70 y.o. male with medical history significant of metastatic prostate cancer, DM, CAD, PVD who presents with AMS, fever/ sepsis 2/2 bilateral LE cellulitis. He now has + positive blood cultures growing gram positive cocci, streptococcus species. Cardiology consulted for CHF, + troponin and evaluation for TEE.   Interval history: patient was transferred back to ICU for hypoxia + AMS. Head CT 7/16 unremarkable. Chest CT 7/16 negative for PE. Small bilateral pleural effusion with associated partial consolidative changes of the right lower lobes which may represent atelectasis versus pneumonia.  Subjective: He notes significant improvement, compared to when he was first admitted. He feels better. Still on 3L [place]. He is concerned that he may not be able to lay supine for TEE which is tentatively scheduled for tomorrow.   Objective: Vital signs in last 24 hours: Temp:  [97.6 F (36.4 C)-98.8 F (37.1 C)] 98.3 F (36.8 C) (07/18 0800) Pulse Rate:  [73-87] 79 (07/18 0815) Resp:  [11-32] 21 (07/18 0800) BP: (119-192)/(53-112) 160/110 mmHg (07/18 0815) SpO2:  [95 %-100 %] 97 % (07/18 0800) Last BM Date: 05/30/16  Intake/Output from previous day: 07/17 0701 - 07/18 0700 In: 1500 [IV Piggyback:1500] Out: 3860 [Urine:3860] Intake/Output this shift: Total I/O In: 250 [IV Piggyback:250] Out: 750 [Urine:750]  Medications Current Facility-Administered Medications  Medication Dose Route Frequency Provider Last Rate Last Dose  . acetaminophen (TYLENOL) tablet 650 mg  650 mg Oral Q6H PRN Belkys A Regalado, MD   650 mg at 05/27/16 2130  . antiseptic oral rinse (CPC / CETYLPYRIDINIUM CHLORIDE 0.05%) solution 7 mL  7 mL Mouth Rinse BID Lily Kocher, MD   7 mL at 05/30/16 2215  . aspirin chewable tablet 81 mg  81 mg Oral Daily Thompson Grayer, MD   81 mg at 05/30/16 0952  . carvedilol (COREG) tablet 6.25 mg  6.25 mg Oral BID WC Thompson Grayer, MD   6.25 mg at 05/31/16 0815  . enoxaparin  (LOVENOX) injection 40 mg  40 mg Subcutaneous Q24H Belkys A Regalado, MD   40 mg at 05/30/16 1704  . feeding supplement (ENSURE ENLIVE) (ENSURE ENLIVE) liquid 237 mL  237 mL Oral BID BM Mir Marry Guan, MD   237 mL at 05/30/16 1400  . feeding supplement (PRO-STAT SUGAR FREE 64) liquid 30 mL  30 mL Oral BID Mir Marry Guan, MD   30 mL at 05/30/16 2214  . furosemide (LASIX) injection 40 mg  40 mg Intravenous BID Mir Marry Guan, MD   40 mg at 05/31/16 0815  . insulin aspart (novoLOG) injection 0-15 Units  0-15 Units Subcutaneous TID WC Belkys A Regalado, MD   2 Units at 05/30/16 1705  . lisinopril (PRINIVIL,ZESTRIL) tablet 5 mg  5 mg Oral Daily Thompson Grayer, MD   5 mg at 05/30/16 0953  . LORazepam (ATIVAN) injection 0.5 mg  0.5 mg Intravenous Once Hewitt Shorts [place], PA-C   0.5 mg at 05/30/16 0230  . ondansetron (ZOFRAN) injection 4 mg  4 mg Intravenous Q6H PRN Belkys A Regalado, MD   4 mg at 05/27/16 0829  . penicillin G potassium 4 Million Units in dextrose 5 % 250 mL IVPB  4 Million Units Intravenous Q4H Tawni Millers, MD   4 Million Units at 05/31/16 0815  . potassium chloride SA (K-DUR,KLOR-CON) CR tablet 40 mEq  40 mEq Oral Daily Mir Marry Guan, MD   40 mEq at 05/30/16 0954    PE: Blood pressure 120/50, pulse 110, temperature 99.2 F (  37.3 C), temperature source Oral, resp. rate 26, height 5\' 5"  (1.651 m), weight 264 lb 1.8 oz (119.8 kg), SpO2 100 %.  General: Pleasant, NAD, obese  Psych: Normal affect. Neuro: Alert and oriented X 3. Moves all extremities spontaneously. HEENT: Normal Neck: Supple without bruits or JVD. Lungs: Resp regular and unlabored, decreased BS at the bases  Heart: RRR no s3, s4, or murmurs. Abdomen: Soft, non-tender, non-distended, BS + x 4.  Extremities: trace bilateral LEE DP/PT/Radials 2+ and equal bilaterally.  Lab Results:   Recent Labs  05/29/16 0208 05/30/16 0321 05/31/16 0331  WBC 9.5 10.8* 10.8*    HGB 10.8* 10.8* 11.2*  HCT 32.0* 32.1* 32.8*  PLT 184 237 279   BMET  Recent Labs  05/29/16 0208 05/30/16 0321 05/31/16 0331  NA 140 138 137  K 3.7 3.4* 3.7  CL 92* 92* 91*  CO2 37* 39* 39*  GLUCOSE 173* 136* 112*  BUN 20 19 19   CREATININE 0.96 0.87 0.92  CALCIUM 8.5* 8.6* 8.8*   PT/INR No results for input(s): LABPROT, INR in the last 72 hours. Cardiac Panel (last 3 results)  Recent Labs  05/29/16 0208 05/29/16 0849  TROPONINI 0.31* 0.30*    Studies/Results: 2D Echo 05/25/16 Study Conclusions  - Left ventricle: The cavity size was normal. There was moderate  focal basal hypertrophy of the septum. Systolic function was  vigorous. The estimated ejection fraction was in the range of 65%  to 70%. Wall motion was normal; there were no regional wall  motion abnormalities. Features are consistent with a pseudonormal  left ventricular filling pattern, with concomitant abnormal  relaxation and increased filling pressure (grade 2 diastolic  dysfunction). Doppler parameters are consistent with elevated  ventricular end-diastolic filling pressure. - Aortic root: The aortic root was normal in size. - Mitral valve: Calcified annulus. Mildly thickened leaflets . - Left atrium: The atrium was mildly dilated. - Right atrium: The atrium was normal in size. - Tricuspid valve: There was mild regurgitation. - Pulmonary arteries: Systolic pressure was within the normal  range. - Inferior vena cava: The vessel was normal in size. - Pericardium, extracardiac: There was no pericardial effusion.  Impressions:  - limited study quality secondary to tachycardia during the  acquisition.   Assessment/Plan  Principal Problem:   Acute respiratory failure with hypoxia (HCC) Active Problems:   Type 2 diabetes mellitus with hyperglycemia (HCC)   Essential hypertension   RBBB   Sleep apnea by history- never followed through with sleep study   CAD (coronary artery  disease)   Morbid obesity ([place])   Sepsis ([place])   Acute diastolic (congestive) heart failure (HCC)   Cellulitis of right lower extremity   Dyspnea   2d echo shows left ventricular diastolic dysfunction-grade 2   1. Sepsis: 2/2 to bacteremia. Management Per IM.   2. Bacteremia: 2/2 bilateral LE cellulitis. Blood cultures + for gram positive cocci, streptococcus species. Antibiotics per IM. No significant valvular abnormalities nor vegetations noted on 2D echo, however we will plan for a TEE this week to better assess cardiac valves to fully r/o endocarditis. We have him tenatively scheduled for TEE 7/19 @11  am. He has diuresed well but still issues with mild orthopnea and remains on 3L [place]. We may need to push back TEE until we diurese further. MD to assess and will held determine timing of procedure.    3. Acute Diastolic CHF:  2D Echo this admit demonstrated vigorous LV systolic function with an EF of 65-70%.  Grade 2DD was noted. He had profound anasarca on admit and has responded well to IV Lasix. Total UOP was 3.8L yesterday.  I/Os net negative 8.6L since admit.   Still on 3L [place] and with mild residual orthopnea. Continue IV Lasix. Renal function stable. Monitor I/Os, BP, renal function and electrolytes.  4. Elevated Troponin: 0.11>0.41>>0.45>>0.48>>1.04. This may be secondary to demand ischemia in the setting of sepsis/ bacteremia. 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 He denies any recent CP. He will need repeat ischemic eval at some point. Can consider a myoview once clinically improved (possibly electively after discharge).   5. Hypokalemia:in the setting of IV diuresis. This resolved after supplementation. K 3.7 today. Continue to monitor.    LOS: 7 days    Brittainy M. Rosita Fire, PA-C 05/31/2016 11:14 AM  History and all data above reviewed.  Patient examined.  I agree with the findings as above. No chest pain.  No SOB.   The  patient exam reveals COR:RRR  ,  Lungs: Clear  ,  Abd: Positive bowel sounds, no rebound no guarding, Ext Mild diffuse edema  .  All available labs, radiology testing, previous records reviewed. Agree with documented assessment and plan. Bacteremia:  Plan TEE tomorrow.  Dyspnea:  Diastolic HF.  Continue IV diuresis.    Jeneen Rinks Suvi Archuletta  11:53 AM  05/31/2016

## 2016-05-31 NOTE — Care Management Note (Signed)
Case Management Note  Patient Details  Name: Theodore [name] MRN: [id number] Date of Birth: 1946-04-07  Subjective/Objective:69 y/o m transfer from SDU. CHF,LE cellulitis, resp failure. For TEE, on 02,cardio following.From home. Would recc PT cons when appropriate.                    Action/Plan:d/c plan home.   Expected Discharge Date:   (UNKNOWN)               Expected Discharge Plan:  Home/Self Care  In-House Referral:     Discharge planning Services  CM Consult  Post Acute Care Choice:    Choice offered to:     DME Arranged:    DME Agency:     HH Arranged:    HH Agency:     Status of Service:  In process, will continue to follow  If discussed at Long Length of Stay Meetings, dates discussed:    Additional Comments:  Dessa Phi, RN 05/31/2016, 2:23 PM

## 2016-05-31 NOTE — Progress Notes (Signed)
PROGRESS NOTE    Theodore [name]  [id number] DOB: [birth year] DOA: 05/24/2016 PCP: [name], FNP   Brief Narrative: 70 yo gentleman admitted to the stepdown unit for management of sepsis secondary to bilateral lower extremity cellulitis and acute diastolic heart failure. Lactic acid 4, WBC 20 at time of admission. Procalcitonin 2.68. Positive for grup B strep bacteremia, will need TEE this week. Volume overloaded, on diuresis with good response with cardiology following. Transfer back to stepdown 7/15 PM for hypoxia and AMS, now improving with reduced oxygen requirement and responding well to IV diuresis.   Assessment & Plan:   Principal Problem:   Acute respiratory failure with hypoxia (HCC) Active Problems:   Type 2 diabetes mellitus with hyperglycemia (HCC)   Essential hypertension   RBBB   Sleep apnea by history- never followed through with sleep study   CAD (coronary artery disease)   Morbid obesity ([place])   Sepsis ([place])   Acute diastolic (congestive) heart failure (HCC)   Cellulitis of right lower extremity   Dyspnea   2d echo shows left ventricular diastolic dysfunction-grade 2  1. Cardiovascular. Sepsis Due to bilateral lower extremity cellulitis, blood cultures positive for streptococcus agalactiae. Antibiotic to penicillin 7/15.  TTE with normal LV function with no reported vegetation. Noted diastolic heart failure probably chronic. Continue metoprolol. Cardiology following, plans TEE tomorrow at [facility].  2. Pulmonary. Pulmonary edema Still with O2 requirement (down from 6L to 3L today), continue diuresis with furosemide, good urine output, continue oxymetry monitor and oxygen supplementation per [place].  3. Nephrology. Renal function with stable Cr, follow renal panel in AM will cont furosemide bid for now with K supplementation.   4. Skin. Bilateral cellulitis, will continue local wound care, pain control.   5. Neuro. Metabolic encephalopathy sudden mental status  change 7/15 PM, transferred to ICU. He's less confused and much improved today, unclear etiology of his confusion. Ammonia normal. Blood gas with CO2 of 60. Continue supportive care for now. Head CT with no acute findings on 7/16.  6. GI. Liver abnormality seen on CT of the chest, may be hemangioma or filling defect. Planned MRI liver in patient but cannot confirm MRI safety of his penile implant. Will plan for study as outpatient; discussed with patient this AM.  Patient at moderate risk for worsening sepsis.    DVT prophylaxis: Lovenox Code Status: FULL CODE Family Communication: Wife not present today, patient alert and oriented. Disposition Plan: Likely to home at d/c in 3-5 days. Out of step down today. Discussed with bedside RN this AM as well.   Consultants:   Cardiology  Procedures: Head CT 7/16 was without acute change.  Antimicrobials:  Vancomycin/Zosyn change to PCN G 7/15.   Subjective: Speaking much more clearly this AM. Denies pain, SOB, cough, nausea.  Objective: Filed Vitals:   05/31/16 0411 05/31/16 0602 05/31/16 0800 05/31/16 0815  BP: 148/65 119/53 160/110 160/110  Pulse: 87 73 75 79  Temp:   98.3 F (36.8 C)   TempSrc:   Oral   Resp: 29 21 21    Height:      Weight:      SpO2: 96% 97% 97%     Intake/Output Summary (Last 24 hours) at 05/31/16 0837 Last data filed at 05/31/16 0410  Gross per 24 hour  Intake   1250 ml  Output   3860 ml  Net  -2610 ml   Filed Weights   05/26/16 0600 05/27/16 0401 05/28/16 1236  Weight: 118.8 kg (261  lb 14.5 oz) 118.434 kg (261 lb 1.6 oz) 115.894 kg (255 lb 8 oz)    Examination:  General exam: deconditioned and ill looking appearing, speaking clearly today. Alert and oriented, carrying on normal conversation. E ENT: conjunctival pallor with dry oral mucosa. Respiratory system: Decreased breath sounds at bases. No wheezing, rales or rhonchi. Anterior auscultation on supine position.  Cardiovascular system: S1 & S2  heard, RRR. No JVD, murmurs, rubs, gallops or clicks.  Lower extremities with compression bandages, upper thigh with 1+ pitting edema, upper ext, positive non pitting edema.  Gastrointestinal system: Abdomen protuberant but nondistended, soft and nontender. No organomegaly or masses felt. Normal bowel sounds heard. Central nervous system: Somnolent. No focal neurological deficits. Extremities: Symmetric 5 x 5 power. Skin: No rashes, lesions or ulcers      Data Reviewed: I have personally reviewed following labs and imaging studies  CBC:  Recent Labs Lab 05/24/16 1031  05/26/16 0334 05/27/16 0334 05/28/16 0328 05/29/16 0208 05/30/16 0321 05/31/16 0331  WBC 20.9*  < > 11.9* 9.6 8.3 9.5 10.8* 10.8*  NEUTROABS 17.9*  --  10.1* 8.0* 6.5  --   --   --   HGB 12.1*  < > 10.8* 10.6* 10.9* 10.8* 10.8* 11.2*  HCT 35.7*  < > 32.7* 32.1* 32.6* 32.0* 32.1* 32.8*  MCV 94.2  < > 95.9 96.1 95.3 93.8 93.9 93.7  PLT 176  < > 158 153 170 184 237 279  < > = values in this interval not displayed. Basic Metabolic Panel:  Recent Labs Lab 05/27/16 0334 05/28/16 0328 05/29/16 0208 05/30/16 0321 05/31/16 0331  NA 139 138 140 138 137  K 3.5 3.5 3.7 3.4* 3.7  CL 98* 94* 92* 92* 91*  CO2 35* 36* 37* 39* 39*  GLUCOSE 153* 115* 173* 136* 112*  BUN 16 16 20 19 19   CREATININE 1.03 0.89 0.96 0.87 0.92  CALCIUM 8.2* 8.4* 8.5* 8.6* 8.8*   GFR: Estimated Creatinine Clearance: 86.8 mL/min (by C-G formula based on Cr of 0.92). Liver Function Tests:  Recent Labs Lab 05/24/16 1031 05/25/16 0059 05/30/16 0321  AST 34 26 37  ALT 30 22 40  ALKPHOS 58 39 46  BILITOT 0.8 0.5 0.8  PROT 6.8 5.4* 6.5  ALBUMIN 3.6 2.8* 2.9*   No results for input(s): LIPASE, AMYLASE in the last 168 hours.  Recent Labs Lab 05/29/16 1350  AMMONIA 23   Coagulation Profile:  Recent Labs Lab 05/24/16 1031 05/24/16 1445  INR 1.26 1.38   Cardiac Enzymes:  Recent Labs Lab 05/25/16 0655 05/25/16 1246  05/25/16 1958 05/29/16 0208 05/29/16 0849  TROPONINI 0.45* 0.48* 1.04* 0.31* 0.30*   BNP (last 3 results) No results for input(s): PROBNP in the last 8760 hours. HbA1C: No results for input(s): HGBA1C in the last 72 hours. CBG:  Recent Labs Lab 05/30/16 0817 05/30/16 1159 05/30/16 1641 05/30/16 2205 05/31/16 0740  GLUCAP 157* 117* 122* 118* 106*   Lipid Profile: No results for input(s): CHOL, HDL, LDLCALC, TRIG, CHOLHDL, LDLDIRECT in the last 72 hours. Thyroid Function Tests: No results for input(s): TSH, T4TOTAL, FREET4, T3FREE, THYROIDAB in the last 72 hours. Anemia Panel: No results for input(s): VITAMINB12, FOLATE, FERRITIN, TIBC, IRON, RETICCTPCT in the last 72 hours. Sepsis Labs:  Recent Labs Lab 05/24/16 1041 05/24/16 1346 05/24/16 1445 05/24/16 1855  PROCALCITON  --   --  2.68  --   LATICACIDVEN 4.35* 3.27* 2.5* 2.6*    Recent Results (from the past 240 hour(s))  Blood Culture (routine x 2)     Status: Abnormal   Collection Time: 05/24/16 10:31 AM  Result Value Ref Range Status   Specimen Description BLOOD LEFT ANTECUBITAL  Final   Special Requests BOTTLES DRAWN AEROBIC AND ANAEROBIC 5ML  Final   Culture  Setup Time   Final    GRAM POSITIVE COCCI IN CLUSTERS IN PAIRS IN CHAINS IN BOTH AEROBIC AND ANAEROBIC BOTTLES CRITICAL RESULT CALLED TO, READ BACK BY AND VERIFIED WITH: B GREEN PHARMD 2335 05/24/16 A BROWNING    Culture (A)  Final    GROUP B STREP(S.AGALACTIAE)ISOLATED SUSCEPTIBILITIES PERFORMED ON PREVIOUS CULTURE WITHIN THE LAST 5 DAYS. Performed at [facility]    Report Status 05/27/2016 FINAL  Final  Blood Culture (routine x 2)     Status: Abnormal   Collection Time: 05/24/16 10:42 AM  Result Value Ref Range Status   Specimen Description BLOOD LEFT WRIST  Final   Special Requests BOTTLES DRAWN AEROBIC AND ANAEROBIC 5ML  Final   Culture  Setup Time   Final    GRAM POSITIVE COCCI IN CLUSTERS IN PAIRS IN CHAINS IN BOTH AEROBIC AND  ANAEROBIC BOTTLES CRITICAL RESULT CALLED TO, READ BACK BY AND VERIFIED WITH: B GREEN PHARMD 2335 05/24/16 A BROWNING Performed at [place] (A)  Final   Report Status 05/27/2016 FINAL  Final   Organism ID, Bacteria STREPTOCOCCUS AGALACTIAE  Final      Susceptibility   Streptococcus agalactiae - MIC*    CLINDAMYCIN >=1 RESISTANT Resistant     AMPICILLIN <=0.25 SENSITIVE Sensitive     ERYTHROMYCIN >=8 RESISTANT Resistant     VANCOMYCIN 0.5 SENSITIVE Sensitive     CEFTRIAXONE <=0.12 SENSITIVE Sensitive     LEVOFLOXACIN 1 SENSITIVE Sensitive     * STREPTOCOCCUS AGALACTIAE  Blood Culture ID Panel (Reflexed)     Status: Abnormal   Collection Time: 05/24/16 10:42 AM  Result Value Ref Range Status   Enterococcus species NOT DETECTED NOT DETECTED Final   Vancomycin resistance NOT DETECTED NOT DETECTED Final   Listeria monocytogenes NOT DETECTED NOT DETECTED Final   Staphylococcus species NOT DETECTED NOT DETECTED Final   Staphylococcus aureus NOT DETECTED NOT DETECTED Final   Methicillin resistance NOT DETECTED NOT DETECTED Final   Streptococcus species DETECTED (A) NOT DETECTED Final    Comment: CRITICAL RESULT CALLED TO, READ BACK BY AND VERIFIED WITH: B GREEN PHARMD 2335 05/24/16 A BROWNING    Streptococcus agalactiae DETECTED (A) NOT DETECTED Final    Comment: CRITICAL RESULT CALLED TO, READ BACK BY AND VERIFIED WITH: B GREEN PHARMD 2335 05/24/16 A BROWNING    Streptococcus pneumoniae NOT DETECTED NOT DETECTED Final   Streptococcus pyogenes NOT DETECTED NOT DETECTED Final   Acinetobacter baumannii NOT DETECTED NOT DETECTED Final   Enterobacteriaceae species NOT DETECTED NOT DETECTED Final   Enterobacter cloacae complex NOT DETECTED NOT DETECTED Final   Escherichia coli NOT DETECTED NOT DETECTED Final   Klebsiella oxytoca NOT DETECTED NOT DETECTED Final   Klebsiella pneumoniae NOT DETECTED NOT DETECTED Final   Proteus species NOT DETECTED  NOT DETECTED Final   Serratia marcescens NOT DETECTED NOT DETECTED Final   Carbapenem resistance NOT DETECTED NOT DETECTED Final   Haemophilus influenzae NOT DETECTED NOT DETECTED Final   Neisseria meningitidis NOT DETECTED NOT DETECTED Final   Pseudomonas aeruginosa NOT DETECTED NOT DETECTED Final   Candida albicans NOT DETECTED NOT DETECTED Final   Candida glabrata NOT DETECTED NOT DETECTED  Final   Candida krusei NOT DETECTED NOT DETECTED Final   Candida parapsilosis NOT DETECTED NOT DETECTED Final   Candida tropicalis NOT DETECTED NOT DETECTED Final    Comment: Performed at [facility]  Urine culture     Status: Abnormal   Collection Time: 05/24/16 12:25 PM  Result Value Ref Range Status   Specimen Description URINE, CATHETERIZED  Final   Special Requests NONE  Final   Culture (A)  Final    <10,000 COLONIES/mL INSIGNIFICANT GROWTH Performed at [facility]    Report Status 05/25/2016 FINAL  Final  MRSA PCR Screening     Status: None   Collection Time: 05/24/16  2:10 PM  Result Value Ref Range Status   MRSA by PCR NEGATIVE NEGATIVE Final    Comment:        The GeneXpert MRSA Assay (FDA approved for NASAL specimens only), is one component of a comprehensive MRSA colonization surveillance program. It is not intended to diagnose MRSA infection nor to guide or monitor treatment for MRSA infections.   Culture, blood (Routine X 2) w Reflex to ID Panel     Status: None   Collection Time: 05/25/16 12:46 PM  Result Value Ref Range Status   Specimen Description BLOOD RIGHT HAND  Final   Special Requests BOTTLES DRAWN AEROBIC AND ANAEROBIC 5CC  Final   Culture   Final    NO GROWTH 5 DAYS Performed at [facility]    Report Status 05/30/2016 FINAL  Final  Culture, blood (Routine X 2) w Reflex to ID Panel     Status: None   Collection Time: 05/25/16 12:46 PM  Result Value Ref Range Status   Specimen Description BLOOD LEFT HAND  Final   Special Requests  BOTTLES DRAWN AEROBIC AND ANAEROBIC 5CC  Final   Culture   Final    NO GROWTH 5 DAYS Performed at [facility]    Report Status 05/30/2016 FINAL  Final         Radiology Studies: Ct Head Wo Contrast  05/29/2016  CLINICAL DATA:  Mental status changes. EXAM: CT HEAD WITHOUT CONTRAST TECHNIQUE: Contiguous axial images were obtained from the base of the skull through the vertex without intravenous contrast. COMPARISON:  None. FINDINGS: There is no evidence for acute hemorrhage, hydrocephalus, mass lesion, or abnormal extra-axial fluid collection. No definite CT evidence for acute infarction. Diffuse loss of parenchymal volume is consistent with atrophy. The visualized paranasal sinuses and mastoid air cells are clear. IMPRESSION: No acute intracranial abnormality. Electronically Signed   By: Misty Stanley M.D.   On: 05/29/2016 15:27        Scheduled Meds: . antiseptic oral rinse  7 mL Mouth Rinse BID  . aspirin  81 mg Oral Daily  . carvedilol  6.25 mg Oral BID WC  . enoxaparin (LOVENOX) injection  40 mg Subcutaneous Q24H  . feeding supplement (ENSURE ENLIVE)  237 mL Oral BID BM  . feeding supplement (PRO-STAT SUGAR FREE 64)  30 mL Oral BID  . furosemide  40 mg Intravenous BID  . insulin aspart  0-15 Units Subcutaneous TID WC  . lisinopril  5 mg Oral Daily  . LORazepam  0.5 mg Intravenous Once  . pencillin G potassium IV  4 Million Units Intravenous Q4H  . potassium chloride  40 mEq Oral Daily   Continuous Infusions:    LOS: 7 days    21 minutes    Sierra Spargo Marry Guan, MD [facility] Pager  [phone]  If 7PM-7AM, please contact night-coverage [email or web address] Password [facility] 05/31/2016, 8:37 AM

## 2016-05-31 NOTE — Progress Notes (Signed)
Pt scheduled for TEE 7/19 at 1100 am at [facility]. [facility] was arranged for pick up at 0930.

## 2016-06-01 ENCOUNTER — Ambulatory Visit (HOSPITAL_COMMUNITY): Admission: RE | Admit: 2016-06-01 | Payer: Medicare Other | Source: Ambulatory Visit | Admitting: Cardiovascular Disease

## 2016-06-01 ENCOUNTER — Inpatient Hospital Stay (HOSPITAL_COMMUNITY): Payer: Medicare Other

## 2016-06-01 ENCOUNTER — Encounter (HOSPITAL_COMMUNITY): Payer: Self-pay | Admitting: *Deleted

## 2016-06-01 ENCOUNTER — Encounter (HOSPITAL_COMMUNITY)
Admission: EM | Disposition: A | Discharge status: Home Health | Payer: Self-pay | Source: Home / Self Care | Attending: Internal Medicine

## 2016-06-01 DIAGNOSIS — R7881 Bacteremia: Secondary | ICD-10-CM

## 2016-06-01 HISTORY — PX: TEE WITHOUT CARDIOVERSION: SHX5443

## 2016-06-01 LAB — CBC
HEMATOCRIT: 34.7 % — AB (ref 39.0–52.0)
HEMOGLOBIN: 11.7 g/dL — AB (ref 13.0–17.0)
MCH: 31.5 pg (ref 26.0–34.0)
MCHC: 33.7 g/dL (ref 30.0–36.0)
MCV: 93.3 fL (ref 78.0–100.0)
Platelets: 284 10*3/uL (ref 150–400)
RBC: 3.72 MIL/uL — AB (ref 4.22–5.81)
RDW: 13.8 % (ref 11.5–15.5)
WBC: 8.9 10*3/uL (ref 4.0–10.5)

## 2016-06-01 LAB — BASIC METABOLIC PANEL
Anion gap: 11 (ref 5–15)
BUN: 20 mg/dL (ref 6–20)
CHLORIDE: 90 mmol/L — AB (ref 101–111)
CO2: 34 mmol/L — AB (ref 22–32)
CREATININE: 0.91 mg/dL (ref 0.61–1.24)
Calcium: 8.9 mg/dL (ref 8.9–10.3)
GFR calc Af Amer: >60 mL/min (ref >60)
GFR calc non Af Amer: >60 mL/min (ref >60)
GLUCOSE: 102 mg/dL — AB (ref 65–99)
POTASSIUM: 3.9 mmol/L (ref 3.5–5.1)
Sodium: 135 mmol/L (ref 135–145)

## 2016-06-01 LAB — GLUCOSE, CAPILLARY
Glucose-Capillary: 121 mg/dL — ABNORMAL HIGH (ref 65–99)
Glucose-Capillary: 122 mg/dL — ABNORMAL HIGH (ref 65–99)
Glucose-Capillary: 144 mg/dL — ABNORMAL HIGH (ref 65–99)

## 2016-06-01 SURGERY — ECHOCARDIOGRAM, TRANSESOPHAGEAL
Anesthesia: Moderate Sedation

## 2016-06-01 MED ORDER — MIDAZOLAM HCL 5 MG/ML IJ SOLN
INTRAMUSCULAR | Status: AC
  Filled 2016-06-01: qty 2

## 2016-06-01 MED ORDER — FENTANYL CITRATE (PF) 100 MCG/2ML IJ SOLN
INTRAMUSCULAR | Status: DC | PRN
  Administered 2016-06-01 (×2): 25 ug via INTRAVENOUS

## 2016-06-01 MED ORDER — SODIUM CHLORIDE 0.9 % IV SOLN: INTRAVENOUS | Status: DC

## 2016-06-01 MED ORDER — FENTANYL CITRATE (PF) 100 MCG/2ML IJ SOLN
INTRAMUSCULAR | Status: AC
  Filled 2016-06-01: qty 2

## 2016-06-01 MED ORDER — MIDAZOLAM HCL 10 MG/2ML IJ SOLN
INTRAMUSCULAR | Status: DC | PRN
  Administered 2016-06-01 (×2): 1 mg via INTRAVENOUS
  Administered 2016-06-01: 2 mg via INTRAVENOUS

## 2016-06-01 MED ORDER — BUTAMBEN-TETRACAINE-BENZOCAINE 2-2-14 % EX AERO
INHALATION_SPRAY | CUTANEOUS | Status: DC | PRN
  Administered 2016-06-01: 2 via TOPICAL

## 2016-06-01 NOTE — Progress Notes (Signed)
PT Cancellation Note  Patient Details Name: Theodore [name] MRN: [id number] DOB: 05-04-1946   Cancelled Treatment:    Reason Eval/Treat Not Completed: Patient at procedure or test/unavailable (Pt at [facility] for TEE. Will follow. )   Philomena Doheny 06/01/2016, 10:28 AM [phone]

## 2016-06-01 NOTE — Op Note (Signed)
INDICATIONS: infective endocarditis  PROCEDURE:   Informed consent was obtained prior to the procedure. The risks, benefits and alternatives for the procedure were discussed and the patient comprehended these risks.  Risks include, but are not limited to, cough, sore throat, vomiting, nausea, somnolence, esophageal and stomach trauma or perforation, bleeding, low blood pressure, aspiration, pneumonia, infection, trauma to the teeth and death.    After a procedural time-out, the oropharynx was anesthetized with 20% benzocaine spray.   During this procedure the patient was administered a total of Versed 4 mg and Fentanyl 50 mcg to achieve and maintain moderate conscious sedation.  The patient's heart rate, blood pressure, and oxygen saturationweare monitored continuously during the procedure. The period of conscious sedation was 20 minutes, of which I was present face-to-face 100% of this time.  The transesophageal probe was inserted in the esophagus and stomach without difficulty and multiple views were obtained.  The patient was kept under observation until the patient left the procedure room.  The patient left the procedure room in stable condition.   Agitated microbubble saline contrast was not administered.  COMPLICATIONS:    There were no immediate complications.  FINDINGS:  No vegetations seen. Normal LV function.  RECOMMENDATIONS:     No evidence of  Endocarditis.  Time Spent Directly with the Patient:  40 minutes   Theodore [name] 06/01/2016, 11:04 AM

## 2016-06-01 NOTE — Progress Notes (Signed)
  Echocardiogram Echocardiogram Transesophageal has been performed.  Tresa Res 06/01/2016, 1:01 PM

## 2016-06-01 NOTE — Care Management Important Message (Signed)
Important Message  Patient Details  Name: MAKAHI BELLOWS MRN: [id number] Date of Birth: 18-Nov-1945   Medicare Important Message Given:  Yes    Camillo Flaming 06/01/2016, 10:10 AMImportant Message  Patient Details  Name: DESIREE ESTABROOKS MRN: [id number] Date of Birth: Mar 31, 1946   Medicare Important Message Given:  Yes    Camillo Flaming 06/01/2016, 10:10 AM

## 2016-06-01 NOTE — Plan of Care (Signed)
Problem: Education: Goal: Knowledge of [place] Education information/materials will improve Outcome: Completed/Met Date Met:  06/01/16 .  Problem: Health Behavior/Discharge Planning: Goal: Ability to manage health-related needs will improve Outcome: Progressing Continue with education regarding condition and test results.

## 2016-06-01 NOTE — Progress Notes (Signed)
PROGRESS NOTE    Theodore [name]  [id number] DOB: 04-04-1946 DOA: 05/24/2016 PCP: [name], FNP   Brief Narrative: 70 yo gentleman admitted to the stepdown unit for management of sepsis secondary to bilateral lower extremity cellulitis and acute diastolic heart failure. Lactic acid 4, WBC 20 at time of admission. Procalcitonin 2.68. Positive for grup B strep bacteremia, will need TEE this week. Volume overloaded, on diuresis with good response with cardiology following. Transfer back to stepdown 7/15 PM for hypoxia and AMS, now improving with reduced oxygen requirement and responding well to IV diuresis. He underwent TEE today 7/19 with no vegetations and normal LV function.   Assessment & Plan:   Principal Problem:   Acute respiratory failure with hypoxia (HCC) Active Problems:   Type 2 diabetes mellitus with hyperglycemia (HCC)   Essential hypertension   RBBB   Sleep apnea by history- never followed through with sleep study   CAD (coronary artery disease)   Morbid obesity ([place])   Sepsis ([place])   Acute diastolic (congestive) heart failure (HCC)   Cellulitis of right lower extremity   Dyspnea   2d echo shows left ventricular diastolic dysfunction-grade 2   Bacteremia  1. Cardiovascular. Sepsis Due to bilateral lower extremity cellulitis, blood cultures positive for streptococcus agalactiae. Antibiotic to penicillin 7/15.  TTE with normal LV function with no reported vegetation. Noted diastolic heart failure probably chronic. Continue metoprolol. Cardiology following, s/p TEE 7/19 at [facility].  2. Pulmonary. Pulmonary edema Still with O2 requirement which is improving, continue diuresis with furosemide, good urine output, continue oxymetry monitor and oxygen supplementation per [place].  3. Nephrology. Renal function with stable Cr, follow renal panel in AM will cont furosemide bid for now with K supplementation.   4. Skin. Bilateral cellulitis, will continue local wound care, pain  control.   5. Neuro. Metabolic encephalopathy sudden mental status change 7/15 PM, transferred to ICU. He's less confused and much improved today, unclear etiology of his confusion. Ammonia normal. Blood gas with CO2 of 60. Continue supportive care for now. Head CT with no acute findings on 7/16.  6. GI. Liver abnormality seen on CT of the chest, may be hemangioma or filling defect. Planned MRI liver in patient but cannot confirm MRI safety of his penile implant. Will plan for study as outpatient; discussed with patient this AM.  Patient at moderate risk for worsening sepsis.   DVT prophylaxis: Lovenox Code Status: Full Code  Family Communication: Wife at the bedside today, patient alert and oriented.  Disposition Plan: Likely to home at d/c in 2-4 days. Discussed with bedside RN this AM as well.   Consultants:   Cardiology  Procedures: Head CT 7/16 was without acute change.  Antimicrobials:  Vancomycin/Zosyn change to PCN G 7/15.  Subjective: He is awake and alert, seen this AM prior to his TEE. He has no chest pain, shortness of breath or nausea.  Objective: Filed Vitals:   06/01/16 1125 06/01/16 1133 06/01/16 1140 06/01/16 1306  BP: 161/68 145/73 129/66 153/73  Pulse: 78 83 79 72  Temp:  97.6 F (36.4 C)  97.1 F (36.2 C)  TempSrc:  Oral  Oral  Resp: 26 23 18  99  Height:      Weight:      SpO2: 97% 87% 95% 2%    Intake/Output Summary (Last 24 hours) at 06/01/16 1440 Last data filed at 06/01/16 0939  Gross per 24 hour  Intake      0 ml  Output  3700 ml  Net  -3700 ml   Filed Weights   05/26/16 0600 05/27/16 0401 05/28/16 1236  Weight: 118.8 kg (261 lb 14.5 oz) 118.434 kg (261 lb 1.6 oz) 115.894 kg (255 lb 8 oz)    Examination:  General exam: deconditioned and well appearing, speaking clearly today. Alert and oriented, carrying on normal conversation. E ENT: conjunctival pallor with dry oral mucosa. Respiratory system: Decreased breath sounds at bases. No  wheezing, rales or rhonchi. Anterior auscultation on supine position.  Cardiovascular system: S1 & S2 heard, RRR. No JVD, murmurs, rubs, gallops or clicks.  Lower extremities with compression bandages, upper thigh with 1+ pitting edema, upper ext, positive non pitting edema.  Gastrointestinal system: Abdomen protuberant but nondistended, soft and nontender. No organomegaly or masses felt. Normal bowel sounds heard. Central nervous system: Somnolent. No focal neurological deficits. Extremities: Symmetric 5 x 5 power. Skin: No rashes, lesions or ulcers      Data Reviewed: I have personally reviewed following labs and imaging studies  CBC:  Recent Labs Lab 05/26/16 0334 05/27/16 0334 05/28/16 0328 05/29/16 0208 05/30/16 0321 05/31/16 0331 06/01/16 0529  WBC 11.9* 9.6 8.3 9.5 10.8* 10.8* 8.9  NEUTROABS 10.1* 8.0* 6.5  --   --   --   --   HGB 10.8* 10.6* 10.9* 10.8* 10.8* 11.2* 11.7*  HCT 32.7* 32.1* 32.6* 32.0* 32.1* 32.8* 34.7*  MCV 95.9 96.1 95.3 93.8 93.9 93.7 93.3  PLT 158 153 170 184 237 279 [id number]   Basic Metabolic Panel:  Recent Labs Lab 05/28/16 0328 05/29/16 0208 05/30/16 0321 05/31/16 0331 06/01/16 0529  NA 138 140 138 137 135  K 3.5 3.7 3.4* 3.7 3.9  CL 94* 92* 92* 91* 90*  CO2 36* 37* 39* 39* 34*  GLUCOSE 115* 173* 136* 112* 102*  BUN 16 20 19 19 20   CREATININE 0.89 0.96 0.87 0.92 0.91  CALCIUM 8.4* 8.5* 8.6* 8.8* 8.9   GFR: Estimated Creatinine Clearance: 87.7 mL/min (by C-G formula based on Cr of 0.91). Liver Function Tests:  Recent Labs Lab 05/30/16 0321  AST 37  ALT 40  ALKPHOS 46  BILITOT 0.8  PROT 6.5  ALBUMIN 2.9*   No results for input(s): LIPASE, AMYLASE in the last 168 hours.  Recent Labs Lab 05/29/16 1350  AMMONIA 23   Coagulation Profile: No results for input(s): INR, PROTIME in the last 168 hours. Cardiac Enzymes:  Recent Labs Lab 05/25/16 1958 05/29/16 0208 05/29/16 0849  TROPONINI 1.04* 0.31* 0.30*   BNP (last 3  results) No results for input(s): PROBNP in the last 8760 hours. HbA1C: No results for input(s): HGBA1C in the last 72 hours. CBG:  Recent Labs Lab 05/31/16 1148 05/31/16 1722 05/31/16 2211 06/01/16 0747 06/01/16 1257  GLUCAP 125* 87 107* 121* 122*   Lipid Profile: No results for input(s): CHOL, HDL, LDLCALC, TRIG, CHOLHDL, LDLDIRECT in the last 72 hours. Thyroid Function Tests: No results for input(s): TSH, T4TOTAL, FREET4, T3FREE, THYROIDAB in the last 72 hours. Anemia Panel: No results for input(s): VITAMINB12, FOLATE, FERRITIN, TIBC, IRON, RETICCTPCT in the last 72 hours. Sepsis Labs: No results for input(s): PROCALCITON, LATICACIDVEN in the last 168 hours.  Recent Results (from the past 240 hour(s))  Blood Culture (routine x 2)     Status: Abnormal   Collection Time: 05/24/16 10:31 AM  Result Value Ref Range Status   Specimen Description BLOOD LEFT ANTECUBITAL  Final   Special Requests BOTTLES DRAWN AEROBIC AND ANAEROBIC 5ML  Final  Culture  Setup Time   Final    GRAM POSITIVE COCCI IN CLUSTERS IN PAIRS IN CHAINS IN BOTH AEROBIC AND ANAEROBIC BOTTLES CRITICAL RESULT CALLED TO, READ BACK BY AND VERIFIED WITH: B GREEN PHARMD 2335 05/24/16 A BROWNING    Culture (A)  Final    GROUP B STREP(S.AGALACTIAE)ISOLATED SUSCEPTIBILITIES PERFORMED ON PREVIOUS CULTURE WITHIN THE LAST 5 DAYS. Performed at [facility]    Report Status 05/27/2016 FINAL  Final  Blood Culture (routine x 2)     Status: Abnormal   Collection Time: 05/24/16 10:42 AM  Result Value Ref Range Status   Specimen Description BLOOD LEFT WRIST  Final   Special Requests BOTTLES DRAWN AEROBIC AND ANAEROBIC 5ML  Final   Culture  Setup Time   Final    GRAM POSITIVE COCCI IN CLUSTERS IN PAIRS IN CHAINS IN BOTH AEROBIC AND ANAEROBIC BOTTLES CRITICAL RESULT CALLED TO, READ BACK BY AND VERIFIED WITH: B GREEN PHARMD 2335 05/24/16 A BROWNING Performed at [facility]  (A)  Final   Report Status 05/27/2016 FINAL  Final   Organism ID, Bacteria STREPTOCOCCUS AGALACTIAE  Final      Susceptibility   Streptococcus agalactiae - MIC*    CLINDAMYCIN >=1 RESISTANT Resistant     AMPICILLIN <=0.25 SENSITIVE Sensitive     ERYTHROMYCIN >=8 RESISTANT Resistant     VANCOMYCIN 0.5 SENSITIVE Sensitive     CEFTRIAXONE <=0.12 SENSITIVE Sensitive     LEVOFLOXACIN 1 SENSITIVE Sensitive     * STREPTOCOCCUS AGALACTIAE  Blood Culture ID Panel (Reflexed)     Status: Abnormal   Collection Time: 05/24/16 10:42 AM  Result Value Ref Range Status   Enterococcus species NOT DETECTED NOT DETECTED Final   Vancomycin resistance NOT DETECTED NOT DETECTED Final   Listeria monocytogenes NOT DETECTED NOT DETECTED Final   Staphylococcus species NOT DETECTED NOT DETECTED Final   Staphylococcus aureus NOT DETECTED NOT DETECTED Final   Methicillin resistance NOT DETECTED NOT DETECTED Final   Streptococcus species DETECTED (A) NOT DETECTED Final    Comment: CRITICAL RESULT CALLED TO, READ BACK BY AND VERIFIED WITH: B GREEN PHARMD 2335 05/24/16 A BROWNING    Streptococcus agalactiae DETECTED (A) NOT DETECTED Final    Comment: CRITICAL RESULT CALLED TO, READ BACK BY AND VERIFIED WITH: B GREEN PHARMD 2335 05/24/16 A BROWNING    Streptococcus pneumoniae NOT DETECTED NOT DETECTED Final   Streptococcus pyogenes NOT DETECTED NOT DETECTED Final   Acinetobacter baumannii NOT DETECTED NOT DETECTED Final   Enterobacteriaceae species NOT DETECTED NOT DETECTED Final   Enterobacter cloacae complex NOT DETECTED NOT DETECTED Final   Escherichia coli NOT DETECTED NOT DETECTED Final   Klebsiella oxytoca NOT DETECTED NOT DETECTED Final   Klebsiella pneumoniae NOT DETECTED NOT DETECTED Final   Proteus species NOT DETECTED NOT DETECTED Final   Serratia marcescens NOT DETECTED NOT DETECTED Final   Carbapenem resistance NOT DETECTED NOT DETECTED Final   Haemophilus influenzae NOT DETECTED NOT DETECTED Final     Neisseria meningitidis NOT DETECTED NOT DETECTED Final   Pseudomonas aeruginosa NOT DETECTED NOT DETECTED Final   Candida albicans NOT DETECTED NOT DETECTED Final   Candida glabrata NOT DETECTED NOT DETECTED Final   Candida krusei NOT DETECTED NOT DETECTED Final   Candida parapsilosis NOT DETECTED NOT DETECTED Final   Candida tropicalis NOT DETECTED NOT DETECTED Final    Comment: Performed at [facility]  Urine culture     Status: Abnormal  Collection Time: 05/24/16 12:25 PM  Result Value Ref Range Status   Specimen Description URINE, CATHETERIZED  Final   Special Requests NONE  Final   Culture (A)  Final    <10,000 COLONIES/mL INSIGNIFICANT GROWTH Performed at [facility]    Report Status 05/25/2016 FINAL  Final  MRSA PCR Screening     Status: None   Collection Time: 05/24/16  2:10 PM  Result Value Ref Range Status   MRSA by PCR NEGATIVE NEGATIVE Final    Comment:        The GeneXpert MRSA Assay (FDA approved for NASAL specimens only), is one component of a comprehensive MRSA colonization surveillance program. It is not intended to diagnose MRSA infection nor to guide or monitor treatment for MRSA infections.   Culture, blood (Routine X 2) w Reflex to ID Panel     Status: None   Collection Time: 05/25/16 12:46 PM  Result Value Ref Range Status   Specimen Description BLOOD RIGHT HAND  Final   Special Requests BOTTLES DRAWN AEROBIC AND ANAEROBIC 5CC  Final   Culture   Final    NO GROWTH 5 DAYS Performed at [facility]    Report Status 05/30/2016 FINAL  Final  Culture, blood (Routine X 2) w Reflex to ID Panel     Status: None   Collection Time: 05/25/16 12:46 PM  Result Value Ref Range Status   Specimen Description BLOOD LEFT HAND  Final   Special Requests BOTTLES DRAWN AEROBIC AND ANAEROBIC 5CC  Final   Culture   Final    NO GROWTH 5 DAYS Performed at [facility]    Report Status 05/30/2016 FINAL  Final  C difficile quick scan w  PCR reflex     Status: None   Collection Time: 05/31/16 11:40 AM  Result Value Ref Range Status   C Diff antigen NEGATIVE NEGATIVE Final   C Diff toxin NEGATIVE NEGATIVE Final   C Diff interpretation No C. difficile detected.  Final         Radiology Studies: No results found.      Scheduled Meds: . antiseptic oral rinse  7 mL Mouth Rinse BID  . aspirin  81 mg Oral Daily  . carvedilol  6.25 mg Oral BID WC  . enoxaparin (LOVENOX) injection  40 mg Subcutaneous Q24H  . feeding supplement (ENSURE ENLIVE)  237 mL Oral BID BM  . feeding supplement (PRO-STAT SUGAR FREE 64)  30 mL Oral BID  . furosemide  40 mg Intravenous BID  . insulin aspart  0-15 Units Subcutaneous TID WC  . lisinopril  5 mg Oral Daily  . LORazepam  0.5 mg Intravenous Once  . pencillin G potassium IV  4 Million Units Intravenous Q4H  . potassium chloride  40 mEq Oral Daily   Continuous Infusions:    LOS: 8 days   24 minutes  Theodore [name] Marry Guan, MD [facility] Pager [phone]  If 7PM-7AM, please contact night-coverage [email or web address] Password [email or web address] 06/01/2016, 2:40 PM

## 2016-06-01 NOTE — Progress Notes (Signed)
Patient Profile: 70 y.o. male with medical history significant of metastatic prostate cancer, DM, CAD, PVD who presents with AMS, fever/ sepsis 2/2 bilateral LE cellulitis. He now has + positive blood cultures growing gram positive cocci, streptococcus species. Cardiology consulted for CHF, + troponin and evaluation for TEE.    Subjective: Breathing continues to improve.   Objective: Vital signs in last 24 hours: Temp:  [98.1 F (36.7 C)-98.4 F (36.9 C)] 98.4 F (36.9 C) (07/19 0645) Pulse Rate:  [77-82] 77 (07/19 0826) Resp:  [18-20] 20 (07/19 0645) BP: (133-163)/(59-80) 163/76 mmHg (07/19 0826) SpO2:  [92 %-97 %] 92 % (07/19 0645) Last BM Date: 05/31/16  Intake/Output from previous day: 07/18 0701 - 07/19 0700 In: 500 [IV Piggyback:500] Out: [id number] [Urine:4550] Intake/Output this shift: Total I/O In: -  Out: 700 [Urine:700]  Medications Current Facility-Administered Medications  Medication Dose Route Frequency Provider Last Rate Last Dose  . acetaminophen (TYLENOL) tablet 650 mg  650 mg Oral Q6H PRN Belkys A Regalado, MD   650 mg at 05/27/16 2130  . antiseptic oral rinse (CPC / CETYLPYRIDINIUM CHLORIDE 0.05%) solution 7 mL  7 mL Mouth Rinse BID Lily Kocher, MD   7 mL at 06/01/16 0825  . aspirin chewable tablet 81 mg  81 mg Oral Daily Thompson Grayer, MD   81 mg at 05/31/16 1100  . carvedilol (COREG) tablet 6.25 mg  6.25 mg Oral BID WC Thompson Grayer, MD   6.25 mg at 06/01/16 0826  . enoxaparin (LOVENOX) injection 40 mg  40 mg Subcutaneous Q24H Belkys A Regalado, MD   40 mg at 05/31/16 1752  . feeding supplement (ENSURE ENLIVE) (ENSURE ENLIVE) liquid 237 mL  237 mL Oral BID BM Mir Marry Guan, MD   237 mL at 05/30/16 1400  . feeding supplement (PRO-STAT SUGAR FREE 64) liquid 30 mL  30 mL Oral BID Mir Marry Guan, MD   30 mL at 05/31/16 2219  . furosemide (LASIX) injection 40 mg  40 mg Intravenous BID Mir Marry Guan, MD   40 mg at 06/01/16 0826  .  insulin aspart (novoLOG) injection 0-15 Units  0-15 Units Subcutaneous TID WC Belkys A Regalado, MD   2 Units at 05/31/16 1239  . lisinopril (PRINIVIL,ZESTRIL) tablet 5 mg  5 mg Oral Daily Thompson Grayer, MD   5 mg at 05/31/16 1100  . LORazepam (ATIVAN) injection 0.5 mg  0.5 mg Intravenous Once Hewitt Shorts [place], PA-C   0.5 mg at 05/30/16 0230  . ondansetron (ZOFRAN) injection 4 mg  4 mg Intravenous Q6H PRN Belkys A Regalado, MD   4 mg at 05/27/16 0829  . penicillin G potassium 4 Million Units in dextrose 5 % 250 mL IVPB  4 Million Units Intravenous Q4H Tawni Millers, MD   4 Million Units at 06/01/16 0914  . potassium chloride SA (K-DUR,KLOR-CON) CR tablet 40 mEq  40 mEq Oral Daily Mir Marry Guan, MD   40 mEq at 06/01/16 0825    PE: Blood pressure 120/50, pulse 110, temperature 99.2 F (37.3 C), temperature source Oral, resp. rate 26, height 5\' 5"  (1.651 m), weight 264 lb 1.8 oz (119.8 kg), SpO2 100 %.  General: Pleasant, NAD, obese  Psych: Normal affect. Neuro: Alert and oriented X 3. Moves all extremities spontaneously. HEENT: Normal Neck: Supple without bruits or JVD. Lungs: Resp regular and unlabored, decreased BS at the bases  Heart: RRR no s3, s4, or murmurs. Abdomen: Soft, non-tender, non-distended, BS + x 4.  Extremities: trace bilateral LEE DP/PT/Radials 2+ and equal bilaterally.  Lab Results:   Recent Labs  05/30/16 0321 05/31/16 0331 06/01/16 0529  WBC 10.8* 10.8* 8.9  HGB 10.8* 11.2* 11.7*  HCT 32.1* 32.8* 34.7*  PLT 237 279 284   BMET  Recent Labs  05/30/16 0321 05/31/16 0331 06/01/16 0529  NA 138 137 135  K 3.4* 3.7 3.9  CL 92* 91* 90*  CO2 39* 39* 34*  GLUCOSE 136* 112* 102*  BUN 19 19 20   CREATININE 0.87 0.92 0.91  CALCIUM 8.6* 8.8* 8.9   PT/INR No results for input(s): LABPROT, INR in the last 72 hours. Cardiac Panel (last 3 results) No results for input(s): CKTOTAL, CKMB, TROPONINI, RELINDX in the last 72  hours.  Studies/Results: 2D Echo 05/25/16 Study Conclusions  - Left ventricle: The cavity size was normal. There was moderate  focal basal hypertrophy of the septum. Systolic function was  vigorous. The estimated ejection fraction was in the range of 65%  to 70%. Wall motion was normal; there were no regional wall  motion abnormalities. Features are consistent with a pseudonormal  left ventricular filling pattern, with concomitant abnormal  relaxation and increased filling pressure (grade 2 diastolic  dysfunction). Doppler parameters are consistent with elevated  ventricular end-diastolic filling pressure. - Aortic root: The aortic root was normal in size. - Mitral valve: Calcified annulus. Mildly thickened leaflets . - Left atrium: The atrium was mildly dilated. - Right atrium: The atrium was normal in size. - Tricuspid valve: There was mild regurgitation. - Pulmonary arteries: Systolic pressure was within the normal  range. - Inferior vena cava: The vessel was normal in size. - Pericardium, extracardiac: There was no pericardial effusion.  Impressions:  - limited study quality secondary to tachycardia during the  acquisition.   Assessment/Plan  Principal Problem:   Acute respiratory failure with hypoxia (HCC) Active Problems:   Type 2 diabetes mellitus with hyperglycemia (HCC)   Essential hypertension   RBBB   Sleep apnea by history- never followed through with sleep study   CAD (coronary artery disease)   Morbid obesity ([place])   Sepsis ([place])   Acute diastolic (congestive) heart failure (HCC)   Cellulitis of right lower extremity   Dyspnea   2d echo shows left ventricular diastolic dysfunction-grade 2   Bacteremia   1. Sepsis: 2/2 to bacteremia. Management Per IM.   2. Bacteremia: 2/2 bilateral LE cellulitis. Blood cultures + for gram positive cocci, streptococcus species. Antibiotics per IM. No significant valvular abnormalities nor vegetations noted  on 2D echo, however we will plan for a TEE today to fully r/o endocarditis.   3. Acute Diastolic CHF:  2D Echo this admit demonstrated vigorous LV systolic function with an EF of 65-70%. Grade 2DD was noted. He had profound anasarca on admit and has responded well to IV Lasix. Total UOP was 4.5L yesterday.  I/Os net negative 12.8L since admit.  Continue IV Lasix. Renal function stable. Monitor I/Os, BP, renal function and electrolytes.  4. Elevated Troponin: 0.11>0.41>>0.45>>0.48>>1.04. This may be secondary to demand ischemia in the setting of sepsis/ bacteremia. 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 He denies any recent CP. He will need repeat ischemic eval at some point. Can consider a myoview once clinically improved (possibly electively after discharge).   5. Hypokalemia: in the setting of IV diuresis. This resolved after supplementation. K remains stable at 3.9 today. Continue to monitor.  LOS: 8 days    Brittainy M. Rosita Fire, PA-C 06/01/2016 10:16 AM

## 2016-06-02 ENCOUNTER — Encounter (HOSPITAL_COMMUNITY): Payer: Self-pay | Admitting: Cardiovascular Disease

## 2016-06-02 LAB — BASIC METABOLIC PANEL
Anion gap: 9 (ref 5–15)
BUN: 23 mg/dL — AB (ref 6–20)
CALCIUM: 9 mg/dL (ref 8.9–10.3)
CHLORIDE: 91 mmol/L — AB (ref 101–111)
CO2: 35 mmol/L — AB (ref 22–32)
CREATININE: 0.9 mg/dL (ref 0.61–1.24)
GFR calc non Af Amer: >60 mL/min (ref >60)
Glucose, Bld: 122 mg/dL — ABNORMAL HIGH (ref 65–99)
Potassium: 4 mmol/L (ref 3.5–5.1)
Sodium: 135 mmol/L (ref 135–145)

## 2016-06-02 LAB — CBC
HEMATOCRIT: 35.5 % — AB (ref 39.0–52.0)
Hemoglobin: 11.8 g/dL — ABNORMAL LOW (ref 13.0–17.0)
MCH: 31.4 pg (ref 26.0–34.0)
MCHC: 33.2 g/dL (ref 30.0–36.0)
MCV: 94.4 fL (ref 78.0–100.0)
Platelets: 307 10*3/uL (ref 150–400)
RBC: 3.76 MIL/uL — ABNORMAL LOW (ref 4.22–5.81)
RDW: 13.9 % (ref 11.5–15.5)
WBC: 9.4 10*3/uL (ref 4.0–10.5)

## 2016-06-02 LAB — GLUCOSE, CAPILLARY
GLUCOSE-CAPILLARY: 136 mg/dL — AB (ref 65–99)
Glucose-Capillary: 126 mg/dL — ABNORMAL HIGH (ref 65–99)
Glucose-Capillary: 93 mg/dL (ref 65–99)
Glucose-Capillary: 170 mg/dL — ABNORMAL HIGH (ref 65–99)

## 2016-06-02 MED ORDER — FUROSEMIDE 40 MG PO TABS
40.0000 mg | ORAL_TABLET | Freq: Two times a day (BID) | ORAL | Status: DC
  Administered 2016-06-02 – 2016-06-04 (×4): 40 mg via ORAL
  Filled 2016-06-02 (×4): qty 1

## 2016-06-02 MED ORDER — AMOXICILLIN 250 MG PO CAPS
500.0000 mg | ORAL_CAPSULE | Freq: Three times a day (TID) | ORAL | Status: DC
  Administered 2016-06-02 – 2016-06-04 (×7): 500 mg via ORAL
  Filled 2016-06-02 (×7): qty 2

## 2016-06-02 MED ORDER — LOPERAMIDE HCL 2 MG PO CAPS: 2.0000 mg | ORAL_CAPSULE | Freq: Two times a day (BID) | ORAL | Status: DC | PRN

## 2016-06-02 MED ORDER — ENSURE ENLIVE PO LIQD
237.0000 mL | ORAL | Status: DC
  Administered 2016-06-03 – 2016-06-04 (×2): 237 mL via ORAL

## 2016-06-02 NOTE — Progress Notes (Addendum)
PROGRESS NOTE    Theodore [name]  [id number] DOB: Oct 30, 1946 DOA: 05/24/2016 PCP: [name], FNP   Brief Narrative:  70 year old male with PMH of metastatic prostate cancer, DM, CAD, PVD, presented with altered mental status & fever and was diagnosed with sepsis secondary to bilateral lower extremity cellulitis  70 yo gentleman admitted to the stepdown unit for management of sepsis secondary to bilateral lower extremity cellulitis and acute diastolic heart failure. Lactic acid 4, WBC 20 at time of admission. Procalcitonin 2.68. Positive for grup B strep bacteremia, will need TEE this week. Volume overloaded, on diuresis with good response with cardiology following. Transfer back to stepdown 7/15 PM for hypoxia and AMS, now improving with reduced oxygen requirement and responding well to IV diuresis. He underwent TEE today 7/19 with no vegetations and normal LV function.   Assessment & Plan:   Principal Problem:   Acute respiratory failure with hypoxia (HCC) Active Problems:   Type 2 diabetes mellitus with hyperglycemia (HCC)   Essential hypertension   RBBB   Sleep apnea by history- never followed through with sleep study   CAD (coronary artery disease)   Morbid obesity ([place])   Sepsis ([place])   Acute diastolic (congestive) heart failure (HCC)   Cellulitis of right lower extremity   Dyspnea   2d echo shows left ventricular diastolic dysfunction-grade 2   Bacteremia  Sepsis - Secondary to bilateral lower extremity cellulitis and group A strep/Streptococcus agalactiae bacteremia - Blood cultures 2 on 05/24/16 showed group A strep/Streptococcus agalactiae. - Surveillance blood cultures 27/12/17: Negative/final report. - Has been on antibiotics since admission 05/24/16 (IV Zosyn 7/11 > 7/14, IV vancomycin 7/11 > 7/14, IV penicillin G 7/15 >present. That is day 10 Abx) - TTE without significant valvular abnormalities nor vegetations - TEE without endocarditis. - Discussed with  infectious disease M.D. on call on 7/20 who recommended changing to oral amoxicillin to complete an additional 1 week of antibiotics.  Acute diastolic CHF - TTE: LVEF [id number] percent and grade 2 diastolic dysfunction. TEE: LVEF 55-60 percent. - Cardiology following. Patient had profound anasarca on admission and responded well to IV Lasix. - When medically improved and has been changed to oral Lasix. As per cardiology, he can follow with Dr.Nishan and can be considered for outpatient Lexiscan Myoview. His presentation was likely related to demand ischemia secondary to his infection and known occlusive CAD.  Acute respiratory failure with hypoxia - Likely secondary to acute diastolic CHF -Improved  Essential hypertension - Controlled  DM type II - Controlled on SSI.   Elevated troponin - From demand ischemia in the setting of sepsis/bacteremia. Outpatient follow-up with cardiology for consideration of Lexiscan Myoview as indicated above.  Hypokalemia - Replaced.  Metabolic encephalopathy - Sided and change in mental status noted on 7/15 PM. Patient was transferred to ICU. Etiology unclear. Ammonia normal. CT head without acute findings. Improved and may have resolved. Continue to monitor.  Liver abnormality seen on CT of the chest,  - may be hemangioma or filling defect. Planned MRI liver in patient but cannot confirm MRI safety of his penile implant. Outpatient evaluation as deemed necessary.  Diarrhea - C. difficile testing negative. Likely related to antibiotics - DC IV antibiotics. Change to oral antibiotics. Imodium when necessary.    DVT prophylaxis: Lovenox Code Status: Full Code Family Communication: Discussed with patient. No family at bedside. Disposition Plan: DC in 1-2 days pending PT evaluation.  Consultants:   Cardiology  CCM  Procedures: Head CT 7/16 was  without acute change.  Antimicrobials:  Discontinued IV antibiotics 7/20. Started  amoxicillin.  Subjective: Diarrhea. No abdominal pain. No dyspnea or chest pain.  Objective: Filed Vitals:   06/01/16 1306 06/01/16 1731 06/01/16 2211 06/02/16 0448  BP: 153/73 144/60 121/65 131/74  Pulse: 72 81 73 77  Temp: 97.1 F (36.2 C)  98.1 F (36.7 C) 98.4 F (36.9 C)  TempSrc: Oral  Oral Oral  Resp: 99  18 20  Height:      Weight:      SpO2: 2%  97% 99%    Intake/Output Summary (Last 24 hours) at 06/02/16 1459 Last data filed at 06/02/16 1038  Gross per 24 hour  Intake      0 ml  Output   4450 ml  Net  -4450 ml   Filed Weights   05/26/16 0600 05/27/16 0401 05/28/16 1236  Weight: 118.8 kg (261 lb 14.5 oz) 118.434 kg (261 lb 1.6 oz) 115.894 kg (255 lb 8 oz)    Examination:  General exam: Pleasant elderly male lying comfortably propped up in bed. Respiratory system: Clear to auscultation. No increased work of breathing. Cardiovascular system: S1 & S2 heard, RRR. No JVD, murmurs, rubs, gallops or clicks.  Lower extremities with UNA boots- trace edema dorsal feet. Gastrointestinal system: Abdomen protuberant but nondistended, soft and nontender. No organomegaly or masses felt. Normal bowel sounds heard. Central nervous system: Alert and oriented 2. Extremities: Symmetric 5 x 5 power. Skin: No rashes, lesions or ulcers      Data Reviewed: I have personally reviewed following labs and imaging studies  CBC:  Recent Labs Lab 05/27/16 0334 05/28/16 0328 05/29/16 0208 05/30/16 0321 05/31/16 0331 06/01/16 0529 06/02/16 0525  WBC 9.6 8.3 9.5 10.8* 10.8* 8.9 9.4  NEUTROABS 8.0* 6.5  --   --   --   --   --   HGB 10.6* 10.9* 10.8* 10.8* 11.2* 11.7* 11.8*  HCT 32.1* 32.6* 32.0* 32.1* 32.8* 34.7* 35.5*  MCV 96.1 95.3 93.8 93.9 93.7 93.3 94.4  PLT 153 170 184 237 279 284 [id number]   Basic Metabolic Panel:  Recent Labs Lab 05/29/16 0208 05/30/16 0321 05/31/16 0331 06/01/16 0529 06/02/16 0525  NA 140 138 137 135 135  K 3.7 3.4* 3.7 3.9 4.0  CL 92* 92* 91*  90* 91*  CO2 37* 39* 39* 34* 35*  GLUCOSE 173* 136* 112* 102* 122*  BUN 20 19 19 20  23*  CREATININE 0.96 0.87 0.92 0.91 0.90  CALCIUM 8.5* 8.6* 8.8* 8.9 9.0   GFR: Estimated Creatinine Clearance: 88.7 mL/min (by C-G formula based on Cr of 0.9). Liver Function Tests:  Recent Labs Lab 05/30/16 0321  AST 37  ALT 40  ALKPHOS 46  BILITOT 0.8  PROT 6.5  ALBUMIN 2.9*   No results for input(s): LIPASE, AMYLASE in the last 168 hours.  Recent Labs Lab 05/29/16 1350  AMMONIA 23   Coagulation Profile: No results for input(s): INR, PROTIME in the last 168 hours. Cardiac Enzymes:  Recent Labs Lab 05/29/16 0208 05/29/16 0849  TROPONINI 0.31* 0.30*   BNP (last 3 results) No results for input(s): PROBNP in the last 8760 hours. HbA1C: No results for input(s): HGBA1C in the last 72 hours. CBG:  Recent Labs Lab 06/01/16 0747 06/01/16 1257 06/01/16 1723 06/02/16 0809 06/02/16 1115  GLUCAP 121* 122* 144* 126* 136*   Lipid Profile: No results for input(s): CHOL, HDL, LDLCALC, TRIG, CHOLHDL, LDLDIRECT in the last 72 hours. Thyroid Function Tests: No results for  input(s): TSH, T4TOTAL, FREET4, T3FREE, THYROIDAB in the last 72 hours. Anemia Panel: No results for input(s): VITAMINB12, FOLATE, FERRITIN, TIBC, IRON, RETICCTPCT in the last 72 hours. Sepsis Labs: No results for input(s): PROCALCITON, LATICACIDVEN in the last 168 hours.  Recent Results (from the past 240 hour(s))  Blood Culture (routine x 2)     Status: Abnormal   Collection Time: 05/24/16 10:31 AM  Result Value Ref Range Status   Specimen Description BLOOD LEFT ANTECUBITAL  Final   Special Requests BOTTLES DRAWN AEROBIC AND ANAEROBIC 5ML  Final   Culture  Setup Time   Final    GRAM POSITIVE COCCI IN CLUSTERS IN PAIRS IN CHAINS IN BOTH AEROBIC AND ANAEROBIC BOTTLES CRITICAL RESULT CALLED TO, READ BACK BY AND VERIFIED WITH: B GREEN PHARMD 2335 05/24/16 A BROWNING    Culture (A)  Final    GROUP B  STREP(S.AGALACTIAE)ISOLATED SUSCEPTIBILITIES PERFORMED ON PREVIOUS CULTURE WITHIN THE LAST 5 DAYS. Performed at [facility]    Report Status 05/27/2016 FINAL  Final  Blood Culture (routine x 2)     Status: Abnormal   Collection Time: 05/24/16 10:42 AM  Result Value Ref Range Status   Specimen Description BLOOD LEFT WRIST  Final   Special Requests BOTTLES DRAWN AEROBIC AND ANAEROBIC 5ML  Final   Culture  Setup Time   Final    GRAM POSITIVE COCCI IN CLUSTERS IN PAIRS IN CHAINS IN BOTH AEROBIC AND ANAEROBIC BOTTLES CRITICAL RESULT CALLED TO, READ BACK BY AND VERIFIED WITH: B GREEN PHARMD 2335 05/24/16 A BROWNING Performed at [place] (A)  Final   Report Status 05/27/2016 FINAL  Final   Organism ID, Bacteria STREPTOCOCCUS AGALACTIAE  Final      Susceptibility   Streptococcus agalactiae - MIC*    CLINDAMYCIN >=1 RESISTANT Resistant     AMPICILLIN <=0.25 SENSITIVE Sensitive     ERYTHROMYCIN >=8 RESISTANT Resistant     VANCOMYCIN 0.5 SENSITIVE Sensitive     CEFTRIAXONE <=0.12 SENSITIVE Sensitive     LEVOFLOXACIN 1 SENSITIVE Sensitive     * STREPTOCOCCUS AGALACTIAE  Blood Culture ID Panel (Reflexed)     Status: Abnormal   Collection Time: 05/24/16 10:42 AM  Result Value Ref Range Status   Enterococcus species NOT DETECTED NOT DETECTED Final   Vancomycin resistance NOT DETECTED NOT DETECTED Final   Listeria monocytogenes NOT DETECTED NOT DETECTED Final   Staphylococcus species NOT DETECTED NOT DETECTED Final   Staphylococcus aureus NOT DETECTED NOT DETECTED Final   Methicillin resistance NOT DETECTED NOT DETECTED Final   Streptococcus species DETECTED (A) NOT DETECTED Final    Comment: CRITICAL RESULT CALLED TO, READ BACK BY AND VERIFIED WITH: B GREEN PHARMD 2335 05/24/16 A BROWNING    Streptococcus agalactiae DETECTED (A) NOT DETECTED Final    Comment: CRITICAL RESULT CALLED TO, READ BACK BY AND VERIFIED WITH: B GREEN PHARMD 2335  05/24/16 A BROWNING    Streptococcus pneumoniae NOT DETECTED NOT DETECTED Final   Streptococcus pyogenes NOT DETECTED NOT DETECTED Final   Acinetobacter baumannii NOT DETECTED NOT DETECTED Final   Enterobacteriaceae species NOT DETECTED NOT DETECTED Final   Enterobacter cloacae complex NOT DETECTED NOT DETECTED Final   Escherichia coli NOT DETECTED NOT DETECTED Final   Klebsiella oxytoca NOT DETECTED NOT DETECTED Final   Klebsiella pneumoniae NOT DETECTED NOT DETECTED Final   Proteus species NOT DETECTED NOT DETECTED Final   Serratia marcescens NOT DETECTED NOT DETECTED Final   Carbapenem  resistance NOT DETECTED NOT DETECTED Final   Haemophilus influenzae NOT DETECTED NOT DETECTED Final   Neisseria meningitidis NOT DETECTED NOT DETECTED Final   Pseudomonas aeruginosa NOT DETECTED NOT DETECTED Final   Candida albicans NOT DETECTED NOT DETECTED Final   Candida glabrata NOT DETECTED NOT DETECTED Final   Candida krusei NOT DETECTED NOT DETECTED Final   Candida parapsilosis NOT DETECTED NOT DETECTED Final   Candida tropicalis NOT DETECTED NOT DETECTED Final    Comment: Performed at [facility]  Urine culture     Status: Abnormal   Collection Time: 05/24/16 12:25 PM  Result Value Ref Range Status   Specimen Description URINE, CATHETERIZED  Final   Special Requests NONE  Final   Culture (A)  Final    <10,000 COLONIES/mL INSIGNIFICANT GROWTH Performed at [facility]    Report Status 05/25/2016 FINAL  Final  MRSA PCR Screening     Status: None   Collection Time: 05/24/16  2:10 PM  Result Value Ref Range Status   MRSA by PCR NEGATIVE NEGATIVE Final    Comment:        The GeneXpert MRSA Assay (FDA approved for NASAL specimens only), is one component of a comprehensive MRSA colonization surveillance program. It is not intended to diagnose MRSA infection nor to guide or monitor treatment for MRSA infections.   Culture, blood (Routine X 2) w Reflex to ID Panel      Status: None   Collection Time: 05/25/16 12:46 PM  Result Value Ref Range Status   Specimen Description BLOOD RIGHT HAND  Final   Special Requests BOTTLES DRAWN AEROBIC AND ANAEROBIC 5CC  Final   Culture   Final    NO GROWTH 5 DAYS Performed at [facility]    Report Status 05/30/2016 FINAL  Final  Culture, blood (Routine X 2) w Reflex to ID Panel     Status: None   Collection Time: 05/25/16 12:46 PM  Result Value Ref Range Status   Specimen Description BLOOD LEFT HAND  Final   Special Requests BOTTLES DRAWN AEROBIC AND ANAEROBIC 5CC  Final   Culture   Final    NO GROWTH 5 DAYS Performed at [facility]    Report Status 05/30/2016 FINAL  Final  C difficile quick scan w PCR reflex     Status: None   Collection Time: 05/31/16 11:40 AM  Result Value Ref Range Status   C Diff antigen NEGATIVE NEGATIVE Final   C Diff toxin NEGATIVE NEGATIVE Final   C Diff interpretation No C. difficile detected.  Final         Radiology Studies: No results found.      Scheduled Meds: . antiseptic oral rinse  7 mL Mouth Rinse BID  . aspirin  81 mg Oral Daily  . carvedilol  6.25 mg Oral BID WC  . enoxaparin (LOVENOX) injection  40 mg Subcutaneous Q24H  . [START ON 06/03/2016] feeding supplement (ENSURE ENLIVE)  237 mL Oral Q24H  . feeding supplement (PRO-STAT SUGAR FREE 64)  30 mL Oral BID  . furosemide  40 mg Oral BID  . insulin aspart  0-15 Units Subcutaneous TID WC  . lisinopril  5 mg Oral Daily  . LORazepam  0.5 mg Intravenous Once  . pencillin G potassium IV  4 Million Units Intravenous Q4H  . potassium chloride  40 mEq Oral Daily   Continuous Infusions:    LOS: 9 days   25 minutes  HONGALGI,ANAND, MD, FACP,  FHM. [facility] Pager [phone]  If 7PM-7AM, please contact night-coverage [email or web address] Password [email or web address] 06/02/2016, 3:16 PM

## 2016-06-02 NOTE — Evaluation (Signed)
Physical Therapy Evaluation Patient Details Name: Theodore [name] MRN: [id number] DOB: [birth year] Today's Date: 06/02/2016   History of Present Illness  70 yo male admitted with acute respiratory failure, sepsis, cellulitis. hx of met prostate cancer, dm, cad, pvd, htn, obesity, hf, le cellultis.   Clinical Impression  On eval, pt required Min assist for mobility. He walked ~50 feet with RW. Recommend HHPT follow up and RW use. Pt is unsure if he has a walker at home.     Follow Up Recommendations Home health PT;Supervision/Assistance - 24 hour    Equipment Recommendations  Rolling walker with 5" wheels (if pt doesn't already have)    Recommendations for Other Services       Precautions / Restrictions Precautions Precautions: Fall Restrictions Weight Bearing Restrictions: No      Mobility  Bed Mobility Overal bed mobility: Needs Assistance Bed Mobility: Supine to Sit;Sit to Supine     Supine to sit: Min assist;HOB elevated Sit to supine: Min guard;HOB elevated   General bed mobility comments: assist for trunk to upright. increased time.   Transfers Overall transfer level: Needs assistance Equipment used: Rolling walker (2 wheeled) Transfers: Sit to/from Stand Sit to Stand: Min assist         General transfer comment: assist to rise, stabilize, control descent. vcs safety, hand placement  Ambulation/Gait Ambulation/Gait assistance: Min assist Ambulation Distance (Feet): 50 Feet Assistive device: Rolling walker (2 wheeled) Gait Pattern/deviations: Step-through pattern;Decreased stride length     General Gait Details: noted R LE external rotation. slow gait speed. assist to stabilize intermittently and to maneuver safely with walker.   Stairs            Wheelchair Mobility    Modified Rankin (Stroke Patients Only)       Balance Overall balance assessment: Needs assistance         Standing balance support: Bilateral upper extremity  supported;During functional activity Standing balance-Leahy Scale: Poor                               Pertinent Vitals/Pain Pain Assessment: Faces Faces Pain Scale: Hurts even more Pain Location: bottom Pain Descriptors / Indicators: Sore Pain Intervention(s): Monitored during session;Repositioned;Limited activity within patient's tolerance    Home Living Family/patient expects to be discharged to:: Private residence Living Arrangements: Spouse/significant other;Other relatives Available Help at Discharge: Family Type of Home: House Home Access: Stairs to enter Entrance Stairs-Rails: Right Entrance Stairs-Number of Steps: 4 Home Layout: One level Home Equipment: Cane - single point;Crutches;Bedside commode      Prior Function Level of Independence: Independent with assistive device(s)         Comments: using cane for ambulation.      Hand Dominance        Extremity/Trunk Assessment   Upper Extremity Assessment: Generalized weakness           Lower Extremity Assessment: Generalized weakness      Cervical / Trunk Assessment: Normal  Communication   Communication: No difficulties  Cognition Arousal/Alertness: Awake/alert Behavior During Therapy: WFL for tasks assessed/performed Overall Cognitive Status: Within Functional Limits for tasks assessed                      General Comments      Exercises        Assessment/Plan    PT Assessment Patient needs continued PT services  PT Diagnosis Difficulty walking;Generalized weakness;Acute pain  PT Problem List Decreased strength;Decreased activity tolerance;Decreased balance;Decreased mobility;Decreased knowledge of use of DME;Pain  PT Treatment Interventions DME instruction;Gait training;Functional mobility training;Therapeutic activities;Patient/family education;Balance training;Therapeutic exercise   PT Goals (Current goals can be found in the Care Plan section) Acute Rehab PT  Goals Patient Stated Goal: home soon PT Goal Formulation: With patient Time For Goal Achievement: 06/16/16 Potential to Achieve Goals: Good    Frequency Min 3X/week   Barriers to discharge        Co-evaluation               End of Session Equipment Utilized During Treatment: Gait belt Activity Tolerance: Patient tolerated treatment well Patient left: in bed;with call bell/phone within reach;with bed alarm set           Time: 1420-1440 PT Time Calculation (min) (ACUTE ONLY): 20 min   Charges:   PT Evaluation $PT Eval Low Complexity: 1 Procedure     PT G Codes:        Weston Anna, MPT Pager: [phone]

## 2016-06-02 NOTE — Progress Notes (Signed)
Patient Name: Theodore [name] Date of Encounter: 06/02/2016  Patient Profile: 70 y.o. male with medical history significant of metastatic prostate cancer, DM, CAD, PVD who presents with AMS, fever/ sepsis 2/2 bilateral LE cellulitis. He now has + positive blood cultures growing gram positive cocci, streptococcus species. Cardiology consulted for CHF, + troponin and evaluation for TEE.  SUBJECTIVE  No complains. No chest pain or sob.   CURRENT MEDS . antiseptic oral rinse  7 mL Mouth Rinse BID  . aspirin  81 mg Oral Daily  . carvedilol  6.25 mg Oral BID WC  . enoxaparin (LOVENOX) injection  40 mg Subcutaneous Q24H  . feeding supplement (ENSURE ENLIVE)  237 mL Oral BID BM  . feeding supplement (PRO-STAT SUGAR FREE 64)  30 mL Oral BID  . furosemide  40 mg Intravenous BID  . insulin aspart  0-15 Units Subcutaneous TID WC  . lisinopril  5 mg Oral Daily  . LORazepam  0.5 mg Intravenous Once  . pencillin G potassium IV  4 Million Units Intravenous Q4H  . potassium chloride  40 mEq Oral Daily    OBJECTIVE  Filed Vitals:   06/01/16 1306 06/01/16 1731 06/01/16 2211 06/02/16 0448  BP: 153/73 144/60 121/65 131/74  Pulse: 72 81 73 77  Temp: 97.1 F (36.2 C)  98.1 F (36.7 C) 98.4 F (36.9 C)  TempSrc: Oral  Oral Oral  Resp: 99  18 20  Height:      Weight:      SpO2: 2%  97% 99%    Intake/Output Summary (Last 24 hours) at 06/02/16 0919 Last data filed at 06/02/16 0448  Gross per 24 hour  Intake    250 ml  Output   2550 ml  Net  -2300 ml   Filed Weights   05/26/16 0600 05/27/16 0401 05/28/16 1236  Weight: 261 lb 14.5 oz (118.8 kg) 261 lb 1.6 oz (118.434 kg) 255 lb 8 oz (115.894 kg)    PHYSICAL EXAM  General: Pleasant, obese male in  NAD. Neuro: Alert and oriented X 3. Moves all extremities spontaneously. Psych: Normal affect. HEENT:  Normal  Neck: Supple without bruits or JVD. Lungs:  Resp regular and unlabored, CTA anteriorly.  Heart: RRR no s3, s4, or  murmurs. Abdomen: Soft, non-tender, non-distended, BS + x 4.  Extremities: No clubbing, cyanosis or edema. DP/PT/Radials 2+ and equal bilaterally.  Accessory Clinical Findings  CBC  Recent Labs  06/01/16 0529 06/02/16 0525  WBC 8.9 9.4  HGB 11.7* 11.8*  HCT 34.7* 35.5*  MCV 93.3 94.4  PLT 284 [id number]   Basic Metabolic Panel  Recent Labs  06/01/16 0529 06/02/16 0525  NA 135 135  K 3.9 4.0  CL 90* 91*  CO2 34* 35*  GLUCOSE 102* 122*  BUN 20 23*  CREATININE 0.91 0.90  CALCIUM 8.9 9.0    TELE  Sinus rhythm at rate of 70s, PVCS  Transesophageal Echocardiography 06/01/16 LV EF: 55% - 60%  ------------------------------------------------------------------- Indications: Bacteremia 790.7.  ------------------------------------------------------------------- Study Conclusions  - Left ventricle: There was mild concentric hypertrophy. Systolic  function was normal. The estimated ejection fraction was in the  range of 55% to 60%. Wall motion was normal; there were no  regional wall motion abnormalities. - Aortic valve: No evidence of vegetation. - Mitral valve: No evidence of vegetation. - Left atrium: The atrium was mildly dilated. No evidence of  thrombus in the atrial cavity or appendage. - Right atrium: No evidence of thrombus in the  atrial cavity or  appendage. - Atrial septum: No defect or patent foramen ovale was identified. - Tricuspid valve: No evidence of vegetation. - Pulmonic valve: No evidence of vegetation.  Impressions:  - No evidence of endocarditis.  Radiology/Studies  Dg Chest 1 View  05/27/2016  CLINICAL DATA:  70 y/o M; shortness of breath and cough for a few days. History of smoking, hypertension, and coronary artery disease. No history of asthma or COPD. EXAM: CHEST 1 VIEW COMPARISON:  Chest radiograph dated 05/26/2016 and 12/29/2005. FINDINGS: Stable Left-greater-than-right basilar opacities probably representing atelectasis and  small effusions. Stable pulmonary vascular congestion. No consolidation or pneumothorax. Left mid lung zone nodularity and sclerotic foci of the proximal humeri and right acromion again seen, not present in 2007. IMPRESSION: Stable basilar opacities probably representing atelectasis small effusions. Pulmonary vascular congestion. Densities projecting over left mid lung zone and sclerotic foci in the bones suggestive of sclerotic bony metastatic disease. Consider CT for further evaluation if clinically indicated. Electronically Signed   By: Kristine Garbe M.D.   On: 05/27/2016 09:51   Dg Chest 1 View  05/26/2016  CLINICAL DATA:  Shortness of breath, followup EXAM: CHEST 1 VIEW COMPARISON:  Chest x-ray of 05/25/2016 FINDINGS: The lungs are not as well opacified with more atelectasis at the lung bases. There may be mild pulmonary vascular congestion present. The questioned vague nodularity in the left mid lung may be bony in origin, but again a if necessary CT the chest could be performed to assess for pulmonary nodules. The heart is mildly enlarged and stable. There are degenerative changes throughout the thoracic spine. IMPRESSION: 1. Decreased aeration with increasing bibasilar atelectasis. 2. Possible mild pulmonary vascular congestion. 3. Probable sclerotic bone metastasis. Cannot exclude pulmonary nodules. Consider CT of the chest if warranted. Electronically Signed   By: Ivar Drape M.D.   On: 05/26/2016 08:06   Ct Head Wo Contrast  05/29/2016  CLINICAL DATA:  Mental status changes. EXAM: CT HEAD WITHOUT CONTRAST TECHNIQUE: Contiguous axial images were obtained from the base of the skull through the vertex without intravenous contrast. COMPARISON:  None. FINDINGS: There is no evidence for acute hemorrhage, hydrocephalus, mass lesion, or abnormal extra-axial fluid collection. No definite CT evidence for acute infarction. Diffuse loss of parenchymal volume is consistent with atrophy. The visualized  paranasal sinuses and mastoid air cells are clear. IMPRESSION: No acute intracranial abnormality. Electronically Signed   By: Misty Stanley M.D.   On: 05/29/2016 15:27   Ct Angio Chest Pe W Or Wo Contrast  05/29/2016  CLINICAL DATA:  70 year old male with hypoxia. EXAM: CT ANGIOGRAPHY CHEST WITH CONTRAST TECHNIQUE: Multidetector CT imaging of the chest was performed using the standard protocol during bolus administration of intravenous contrast. Multiplanar CT image reconstructions and MIPs were obtained to evaluate the vascular anatomy. CONTRAST:  100 cc Isovue 370 COMPARISON:  Chest radiograph dated 05/27/2016 FINDINGS: Small bilateral pleural effusion with associated partial consolidative changes of the right lower lobes which may represent atelectasis versus pneumonia. There is emphysematous changes of the lungs with interstitial thickening primarily involving the upper lobes likely all chronic. There is no pneumothorax. The central airways are patent. There is atherosclerotic calcification of the thoracic aorta. No dissection or aneurysm. The origins of the great vessels of the aortic arch appear patent. Evaluation of the pulmonary arteries is limited due to respiratory motion artifact and suboptimal visualization of the peripheral branches. No definite pulmonary artery embolus identified. There is mild cardiomegaly. No pericardial effusion. There is  coronary vascular calcification. No hilar or mediastinal adenopathy. Esophagus is grossly unremarkable. No thyroid nodules identified. There is no axillary adenopathy. The chest wall soft tissues appear unremarkable. There is osteopenia with degenerative changes of the spine. There are innumerable osseous sclerotic lesions most concerning for metastatic disease, likely related to prostate cancer. An 8 mm enhancing focus in the dome of the liver (series 4, image 79) is not well characterized but may represent a flash filling hemangioma or portal venous shunting.  MRI without and with contrast is recommended for further characterization. Review of the MIP images confirms the above findings. IMPRESSION: No CT evidence of pulmonary embolism. Small bilateral pleural effusions with partial consolidative changes of the lower lobes, atelectasis versus infiltrate. Emphysema with chronic interstitial changes, predominantly in the upper lobes. Extensive osseous metastatic disease. Small enhancing focus at the dome of the liver, incompletely characterized. MRI is recommended for further evaluation. Electronically Signed   By: Anner Crete M.D.   On: 05/29/2016 02:47   Dg Chest Port 1 View  05/25/2016  CLINICAL DATA:  Dyspnea EXAM: PORTABLE CHEST 1 VIEW COMPARISON:  05/24/2016 FINDINGS: Cardiac shadow is stable. The lungs are well aerated bilaterally. There again noted sclerotic metastatic lesions consistent with the patient's given clinical history. Some suggestion of nodularity within the left lung is noted. This may be related to overlying bony tissues but a few of the somewhat nodular areas appear to be extrinsic to the bone. Noncontrast CT may be helpful for further evaluation. Increased fullness in the right medial lung base is noted likely related to early infiltrate. IMPRESSION: Changes consistent with sclerotic bone mets. Slight nodularity in the left lung which may be extrinsic to the bony structures. Additionally some increased density in the right medial lung base is noted. CT may be helpful for further evaluation. Electronically Signed   By: Inez Catalina M.D.   On: 05/25/2016 08:56   Dg Chest Port 1 View  05/24/2016  CLINICAL DATA:  Shortness of breath, fever and altered mental status for approximately 1-1/2 days. EXAM: PORTABLE CHEST 1 VIEW COMPARISON:  PA and lateral chest 05/13/2008. CT abdomen and pelvis 06/15/2015 FINDINGS: Innumerable sclerotic lesions are identified about the shoulders and in multiple ribs consistent with metastatic prostate carcinoma, new  since the prior chest films. Bone metastases are seen on the prior CT Lung volumes are somewhat low. No pneumothorax or pleural effusion is seen. No consolidative process is identified. Heart size is normal. IMPRESSION: No acute cardiopulmonary disease. Innumerable sclerotic osseous lesions consistent with metastatic prostate carcinoma. Electronically Signed   By: Inge Rise M.D.   On: 05/24/2016 11:09    ASSESSMENT AND PLAN  1. Sepsis: 2/2 to bacteremia. Management Per IM.   2. Bacteremia: 2/2 bilateral LE cellulitis. Blood cultures + for gram positive cocci, streptococcus species. Antibiotics per IM. No significant valvular abnormalities nor vegetations noted on 2D echo.  TEE without endocarditis.   3. Acute Diastolic CHF: 2D Echo this admit demonstrated vigorous LV systolic function with an EF of 65-70%. Grade 2DD was noted. TEE showed LV EF of 55-60%, no WM abnormality.  He had profound anasarca on admit and has responded well to IV Lasix. Total UOP was 2.05 yesterday. I/Os net negative 13.19L since admit. Close to euvolemic. Consider switch to po lasix. SCr and K stable.   4. Elevated Troponin: 0.11>0.41>>0.45>>0.48>>1.04. Likely secondary to demand ischemia in the setting of sepsis/ bacteremia. 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 He denies any recent CP. Given normal EF without WM abnormality, will do outpatient stress test once recover.   5. Hypokalemia: in the setting of IV diuresis. Resolved.   Signed, Bhagat,Bhavinkumar PA-C Pager [phone]  History and all data above reviewed.  Patient examined.  I agree with the findings as above.  Breathing well.  Eating a Honey Bun when I walked in.   The patient exam reveals COR:RRR  ,  Lungs: Clear  ,  Abd: Positive bowel sounds, no rebound no guarding, Ext Mild edema much improved.    .  All available labs, radiology testing, previous records reviewed. Agree with documented  assessment and plan.  Elevated troponin.  He can follow with Dr. [name] as an out patient and can be considered for out patient Lexiscan Myoview.   This was likely related to demand ischemia with his infection and known occlusive CAD.  Change to PO diuretic.    Tyrone Pautsch  11:00 AM  06/02/2016

## 2016-06-02 NOTE — Progress Notes (Signed)
Nutrition Follow-up  DOCUMENTATION CODES:   Morbid obesity  INTERVENTION:  - Continue Prostat BID and will decrease Ensure Enlive to once/day.  - Continue Heart Healthy diet order. - RD will continue to attempt education related to low Na diet for home.  NUTRITION DIAGNOSIS:   Inadequate oral intake related to poor appetite as evidenced by per patient/family report, meal completion < 50%. -ongoing per pt report.  GOAL:   Patient will meet greater than or equal to 90% of their needs -likely met on average.  MONITOR:   PO intake, Supplement acceptance, Weight trends, Labs, Skin, I & O's  ASSESSMENT:   70 y.o. male with medical history significant of metastatic prostate cancer, DM, CAD, PVD who presents with AMS, fever. Per report patient has been confuse,sleepy for last day. His confusion and lethargic has improved after fluids and treatment for fever in the ED. He report Bilateral lower extremities redness, edema and drainage for last 3 weeks. He saw his PCP 3 weeks ago and was prescribe doxycycline. He finished course and saw PCP 1 week ago and was recommended to do locar care,rapping of the legs. He relates that redness and drainage has persisted for last 3 weeks.he develops fever 3 days ago. He also relates dyspnea,worse when he lying down flat,for years.   7/20 No intakes documented since previous assessment. Diet order changed early this AM from Heart Healthy/Carb Modified to Heart Healthy only. Diet order explained to pt and reasoning for restriction also explained. Pt states that he has always heavily used salt shaker and that he adds salt to food before tasting it. He reports that coworkers and cardiologist have educated him on need to limit salt/sodium in his diet, but overall he has made no changes to eating habits. Attempted to educate pt during discussion but he was not overly receptive to changes encouraged.   He reports that appetite has remained fair, but that he consumed  a Honey Bun and coffee brought in by his wife earlier today. Pt had TEE yesterday which showed no vegetation, no evidence of endocarditis, and normal LV function, per cardiology note.   Per review, pt's weight currently 3 lbs below admission weight with ongoing diuresis.  Medications reviewed; 40 mg oral Lasix BID, sliding scale Novolog, 40 oral mEq KCl/day. Labs reviewed; CBGs: 121-144 mg/dL, Cl: 91 mmol/L, BUN: 23 mg/dL.   7/17 - No intakes documented since admission.  - Pt reports he ate <50% of breakfast this AM. - Pt states that he cannot recall other meals other than breakfast on day of admission at which time he ate approximately the same amount.  - Pt denies abdominal pain or nausea at this time.  - Pt denies any chewing or swallowing difficulties at baseline.  - Pt is slightly confused during discussion; unable to obtain PTA information at this time.  - Physical assessment indicates no muscle or fat wasting, mild edema to upper body and moderate edema to lower body.  - Pt denies weight loss or weight gain PTA. Per chart review, weight stable x1 month PTA and pt has gained ~8 lbs since August 2016.   Diet Order:  Diet Heart Room service appropriate?: Yes; Fluid consistency:: Thin  Skin:  Wound (see comment) (BLE cellulitis)  Last BM:  7/20  Height:   Ht Readings from Last 1 Encounters:  05/28/16 _0  (1.651 m)    Weight:   Wt Readings from Last 1 Encounters:  05/28/16 255 lb 8 oz (115.894 kg)  Ideal Body Weight:  61.82 kg (kg)  BMI:  Body mass index is 42.52 kg/(m^2).  Estimated Nutritional Needs:   Kcal:  2050-2250  Protein:  115-125 grams  Fluid:  2 L/day  EDUCATION NEEDS:   Education needs addressed     Jarome Matin, MS, RD, LDN Inpatient Clinical Dietitian Pager # [phone] After hours/weekend pager # [phone]

## 2016-06-03 LAB — GLUCOSE, CAPILLARY
GLUCOSE-CAPILLARY: 135 mg/dL — AB (ref 65–99)
GLUCOSE-CAPILLARY: 126 mg/dL — AB (ref 65–99)
Glucose-Capillary: 93 mg/dL (ref 65–99)
Glucose-Capillary: 99 mg/dL (ref 65–99)

## 2016-06-03 NOTE — Progress Notes (Signed)
Patient Name: Theodore [name] Date of Encounter: 06/03/2016  Patient Profile: 70 y.o. male with medical history significant of metastatic prostate cancer, DM, CAD, PVD who presents with AMS, fever/ sepsis 2/2 bilateral LE cellulitis. He now has + positive blood cultures growing gram positive cocci, streptococcus species. Cardiology consulted for CHF, + troponin and evaluation for TEE.  SUBJECTIVE  Ambulated with PT yesterday.  No chest pain or sob. Family is concern about memory issue.   CURRENT MEDS . amoxicillin  500 mg Oral Q8H  . antiseptic oral rinse  7 mL Mouth Rinse BID  . aspirin  81 mg Oral Daily  . carvedilol  6.25 mg Oral BID WC  . enoxaparin (LOVENOX) injection  40 mg Subcutaneous Q24H  . feeding supplement (ENSURE ENLIVE)  237 mL Oral Q24H  . feeding supplement (PRO-STAT SUGAR FREE 64)  30 mL Oral BID  . furosemide  40 mg Oral BID  . insulin aspart  0-15 Units Subcutaneous TID WC  . lisinopril  5 mg Oral Daily  . potassium chloride  40 mEq Oral Daily    OBJECTIVE  Filed Vitals:   06/02/16 0448 06/02/16 1430 06/02/16 2150 06/03/16 0500  BP: 131/74 108/53 121/70 116/73  Pulse: 77 78 79 77  Temp: 98.4 F (36.9 C) 97.6 F (36.4 C) 98 F (36.7 C) 98.1 F (36.7 C)  TempSrc: Oral Oral Oral Oral  Resp: 20 20 18 18   Height:      Weight:      SpO2: 99% 98% 100% 97%    Intake/Output Summary (Last 24 hours) at 06/03/16 0814 Last data filed at 06/03/16 0752  Gross per 24 hour  Intake    960 ml  Output   4400 ml  Net  -3440 ml   Filed Weights   05/26/16 0600 05/27/16 0401 05/28/16 1236  Weight: 261 lb 14.5 oz (118.8 kg) 261 lb 1.6 oz (118.434 kg) 255 lb 8 oz (115.894 kg)    PHYSICAL EXAM  General: Pleasant, obese male in  NAD. Neuro: Alert and oriented X 3. Moves all extremities spontaneously. Psych: Normal affect. HEENT:  Normal  Neck: Supple without bruits or JVD. Lungs:  Resp regular and unlabored, CTA anteriorly.  Heart: RRR no s3, s4, or  murmurs. Abdomen: Soft, non-tender, non-distended, BS + x 4.  Extremities: No clubbing, cyanosis. ACE warp, mild edema.  DP/PT/Radials 2+ and equal bilaterally.  Accessory Clinical Findings  CBC  Recent Labs  06/01/16 0529 06/02/16 0525  WBC 8.9 9.4  HGB 11.7* 11.8*  HCT 34.7* 35.5*  MCV 93.3 94.4  PLT 284 [id number]   Basic Metabolic Panel  Recent Labs  06/01/16 0529 06/02/16 0525  NA 135 135  K 3.9 4.0  CL 90* 91*  CO2 34* 35*  GLUCOSE 102* 122*  BUN 20 23*  CREATININE 0.91 0.90  CALCIUM 8.9 9.0    TELE  Sinus rhythm at rate of 70s, PVCS  Transesophageal Echocardiography 06/01/16 LV EF: 55% - 60%  ------------------------------------------------------------------- Indications: Bacteremia 790.7.  ------------------------------------------------------------------- Study Conclusions  - Left ventricle: There was mild concentric hypertrophy. Systolic  function was normal. The estimated ejection fraction was in the  range of 55% to 60%. Wall motion was normal; there were no  regional wall motion abnormalities. - Aortic valve: No evidence of vegetation. - Mitral valve: No evidence of vegetation. - Left atrium: The atrium was mildly dilated. No evidence of  thrombus in the atrial cavity or appendage. - Right atrium: No evidence of thrombus  in the atrial cavity or  appendage. - Atrial septum: No defect or patent foramen ovale was identified. - Tricuspid valve: No evidence of vegetation. - Pulmonic valve: No evidence of vegetation.  Impressions:  - No evidence of endocarditis.  Radiology/Studies  Dg Chest 1 View  05/27/2016  CLINICAL DATA:  70 y/o M; shortness of breath and cough for a few days. History of smoking, hypertension, and coronary artery disease. No history of asthma or COPD. EXAM: CHEST 1 VIEW COMPARISON:  Chest radiograph dated 05/26/2016 and 12/29/2005. FINDINGS: Stable Left-greater-than-right basilar opacities probably representing  atelectasis and small effusions. Stable pulmonary vascular congestion. No consolidation or pneumothorax. Left mid lung zone nodularity and sclerotic foci of the proximal humeri and right acromion again seen, not present in 2007. IMPRESSION: Stable basilar opacities probably representing atelectasis small effusions. Pulmonary vascular congestion. Densities projecting over left mid lung zone and sclerotic foci in the bones suggestive of sclerotic bony metastatic disease. Consider CT for further evaluation if clinically indicated. Electronically Signed   By: Kristine Garbe M.D.   On: 05/27/2016 09:51   Dg Chest 1 View  05/26/2016  CLINICAL DATA:  Shortness of breath, followup EXAM: CHEST 1 VIEW COMPARISON:  Chest x-ray of 05/25/2016 FINDINGS: The lungs are not as well opacified with more atelectasis at the lung bases. There may be mild pulmonary vascular congestion present. The questioned vague nodularity in the left mid lung may be bony in origin, but again a if necessary CT the chest could be performed to assess for pulmonary nodules. The heart is mildly enlarged and stable. There are degenerative changes throughout the thoracic spine. IMPRESSION: 1. Decreased aeration with increasing bibasilar atelectasis. 2. Possible mild pulmonary vascular congestion. 3. Probable sclerotic bone metastasis. Cannot exclude pulmonary nodules. Consider CT of the chest if warranted. Electronically Signed   By: Ivar Drape M.D.   On: 05/26/2016 08:06   Ct Head Wo Contrast  05/29/2016  CLINICAL DATA:  Mental status changes. EXAM: CT HEAD WITHOUT CONTRAST TECHNIQUE: Contiguous axial images were obtained from the base of the skull through the vertex without intravenous contrast. COMPARISON:  None. FINDINGS: There is no evidence for acute hemorrhage, hydrocephalus, mass lesion, or abnormal extra-axial fluid collection. No definite CT evidence for acute infarction. Diffuse loss of parenchymal volume is consistent with  atrophy. The visualized paranasal sinuses and mastoid air cells are clear. IMPRESSION: No acute intracranial abnormality. Electronically Signed   By: Misty Stanley M.D.   On: 05/29/2016 15:27   Ct Angio Chest Pe W Or Wo Contrast  05/29/2016  CLINICAL DATA:  70 year old male with hypoxia. EXAM: CT ANGIOGRAPHY CHEST WITH CONTRAST TECHNIQUE: Multidetector CT imaging of the chest was performed using the standard protocol during bolus administration of intravenous contrast. Multiplanar CT image reconstructions and MIPs were obtained to evaluate the vascular anatomy. CONTRAST:  100 cc Isovue 370 COMPARISON:  Chest radiograph dated 05/27/2016 FINDINGS: Small bilateral pleural effusion with associated partial consolidative changes of the right lower lobes which may represent atelectasis versus pneumonia. There is emphysematous changes of the lungs with interstitial thickening primarily involving the upper lobes likely all chronic. There is no pneumothorax. The central airways are patent. There is atherosclerotic calcification of the thoracic aorta. No dissection or aneurysm. The origins of the great vessels of the aortic arch appear patent. Evaluation of the pulmonary arteries is limited due to respiratory motion artifact and suboptimal visualization of the peripheral branches. No definite pulmonary artery embolus identified. There is mild cardiomegaly. No pericardial effusion.  There is coronary vascular calcification. No hilar or mediastinal adenopathy. Esophagus is grossly unremarkable. No thyroid nodules identified. There is no axillary adenopathy. The chest wall soft tissues appear unremarkable. There is osteopenia with degenerative changes of the spine. There are innumerable osseous sclerotic lesions most concerning for metastatic disease, likely related to prostate cancer. An 8 mm enhancing focus in the dome of the liver (series 4, image 79) is not well characterized but may represent a flash filling hemangioma or  portal venous shunting. MRI without and with contrast is recommended for further characterization. Review of the MIP images confirms the above findings. IMPRESSION: No CT evidence of pulmonary embolism. Small bilateral pleural effusions with partial consolidative changes of the lower lobes, atelectasis versus infiltrate. Emphysema with chronic interstitial changes, predominantly in the upper lobes. Extensive osseous metastatic disease. Small enhancing focus at the dome of the liver, incompletely characterized. MRI is recommended for further evaluation. Electronically Signed   By: Anner Crete M.D.   On: 05/29/2016 02:47   Dg Chest Port 1 View  05/25/2016  CLINICAL DATA:  Dyspnea EXAM: PORTABLE CHEST 1 VIEW COMPARISON:  05/24/2016 FINDINGS: Cardiac shadow is stable. The lungs are well aerated bilaterally. There again noted sclerotic metastatic lesions consistent with the patient's given clinical history. Some suggestion of nodularity within the left lung is noted. This may be related to overlying bony tissues but a few of the somewhat nodular areas appear to be extrinsic to the bone. Noncontrast CT may be helpful for further evaluation. Increased fullness in the right medial lung base is noted likely related to early infiltrate. IMPRESSION: Changes consistent with sclerotic bone mets. Slight nodularity in the left lung which may be extrinsic to the bony structures. Additionally some increased density in the right medial lung base is noted. CT may be helpful for further evaluation. Electronically Signed   By: Inez Catalina M.D.   On: 05/25/2016 08:56   Dg Chest Port 1 View  05/24/2016  CLINICAL DATA:  Shortness of breath, fever and altered mental status for approximately 1-1/2 days. EXAM: PORTABLE CHEST 1 VIEW COMPARISON:  PA and lateral chest 05/13/2008. CT abdomen and pelvis 06/15/2015 FINDINGS: Innumerable sclerotic lesions are identified about the shoulders and in multiple ribs consistent with metastatic  prostate carcinoma, new since the prior chest films. Bone metastases are seen on the prior CT Lung volumes are somewhat low. No pneumothorax or pleural effusion is seen. No consolidative process is identified. Heart size is normal. IMPRESSION: No acute cardiopulmonary disease. Innumerable sclerotic osseous lesions consistent with metastatic prostate carcinoma. Electronically Signed   By: Inge Rise M.D.   On: 05/24/2016 11:09    ASSESSMENT AND PLAN  1. Sepsis: 2/2 to bacteremia. Management Per IM.   2. Bacteremia: 2/2 bilateral LE cellulitis. Blood cultures + for gram positive cocci, streptococcus species. Antibiotics per IM. No significant valvular abnormalities nor vegetations noted on 2D echo.  TEE without endocarditis.   3. Acute Diastolic CHF: 2D Echo this admit demonstrated vigorous LV systolic function with an EF of 65-70%. Grade 2DD was noted. TEE showed LV EF of 55-60%, no WM abnormality.  He had profound anasarca on admit and has responded well to IV Lasix. Total UOP was 2.6 yesterday.  I/Os net negative 16.6 L since admit.SCr and K stable. Continue BB, lasix po 40mg  BID, ACE.   4. Elevated Troponin: 0.11>0.41>>0.45>>0.48>>1.04. Likely secondary to demand ischemia in the setting of sepsis/ bacteremia. 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 He denies any recent CP. Given normal EF without WM abnormality. Likely outpatient   5. Hypokalemia: in the setting of IV diuresis. Resolved.    Dispo: Continue current medications. Outpatient f/u with Dr. [name] who will determine need of stress test. Will sign off. Dr. Percival Spanish to see later today. Family concern about memory issue, will defer to primary.   Signed, Bhagat,Bhavinkumar PA-C   History and all data above reviewed.  Patient examined.  I agree with the findings as above.  Somnolent.  The patient exam reveals COR:RRR  ,  Lungs: Clear  ,  Abd: Positive bowel sounds, no  rebound no guarding, Ext Mild diffuse edema  .  All available labs, radiology testing, previous records reviewed. Agree with documented assessment and plan.  Plan as above.  Continue current therapy.  Please call with further questions.    Theodore [name]  11:20 AM  06/03/2016

## 2016-06-03 NOTE — Progress Notes (Signed)
PROGRESS NOTE    Theodore [name]  [id number] DOB: 1946-10-02 DOA: 05/24/2016 PCP: [name], FNP   Brief Narrative:  70 year old male with PMH of metastatic prostate cancer, DM, CAD, PVD, presented with altered mental status & fever and was diagnosed with sepsis secondary to bilateral lower extremity cellulitis  70 yo gentleman admitted to the stepdown unit for management of sepsis secondary to bilateral lower extremity cellulitis and acute diastolic heart failure. Lactic acid 4, WBC 20 at time of admission. Procalcitonin 2.68. Positive for grup B strep bacteremia, will need TEE this week. Volume overloaded, on diuresis with good response with cardiology following. Transfer back to stepdown 7/15 PM for hypoxia and AMS, now improving with reduced oxygen requirement and responding well to IV diuresis. He underwent TEE today 7/19 with no vegetations and normal LV function.   Assessment & Plan:   Principal Problem:   Acute respiratory failure with hypoxia (HCC) Active Problems:   Type 2 diabetes mellitus with hyperglycemia (HCC)   Essential hypertension   RBBB   Sleep apnea by history- never followed through with sleep study   CAD (coronary artery disease)   Morbid obesity ([place])   Sepsis ([place])   Acute diastolic (congestive) heart failure (HCC)   Cellulitis of right lower extremity   Dyspnea   2d echo shows left ventricular diastolic dysfunction-grade 2   Bacteremia  Sepsis - Likely DC in am. - Secondary to bilateral lower extremity cellulitis and group A strep/Streptococcus agalactiae bacteremia - Blood cultures 2 on 05/24/16 showed group A strep/Streptococcus agalactiae. - Surveillance blood cultures 27/12/17: Negative/final report. - Has been on antibiotics since admission 05/24/16 (IV Zosyn 7/11 > 7/14, IV vancomycin 7/11 > 7/14, IV penicillin G 7/15 >present. That is day 10 Abx) - TTE without significant valvular abnormalities nor vegetations - TEE without  endocarditis. - Discussed with infectious disease M.D. on call on 7/20 who recommended changing to oral amoxicillin to complete an additional 1 week of antibiotics.  Acute diastolic CHF - TTE: LVEF [id number] percent and grade 2 diastolic dysfunction. TEE: LVEF 55-60 percent. - Cardiology following. Patient had profound anasarca on admission and responded well to IV Lasix. - When medically improved and has been changed to oral Lasix. As per cardiology, he can follow with Dr.Nishan and can be considered for outpatient Lexiscan Myoview. His presentation was likely related to demand ischemia secondary to his infection and known occlusive CAD.  Acute respiratory failure with hypoxia - Likely secondary to acute diastolic CHF -Improved  Essential hypertension - Controlled  DM type II - Controlled on SSI.   Elevated troponin - From demand ischemia in the setting of sepsis/bacteremia. Outpatient follow-up with cardiology for consideration of Lexiscan Myoview as indicated above.  Hypokalemia - Replaced.  Metabolic encephalopathy - Sided and change in mental status noted on 7/15 PM. Patient was transferred to ICU. Etiology unclear. Ammonia normal. CT head without acute findings. Improved and may have resolved. Continue to monitor.  Liver abnormality seen on CT of the chest,  - may be hemangioma or filling defect. Planned MRI liver in patient but cannot confirm MRI safety of his penile implant. Outpatient evaluation as deemed necessary.  Diarrhea - C. difficile testing negative. Likely related to antibiotics - DC IV antibiotics. Change to oral antibiotics. Imodium when necessary.    DVT prophylaxis: Lovenox Code Status: Full Code Family Communication: Discussed with patient. No family at bedside. Disposition Plan: DC in 1-2 days pending PT evaluation.  Consultants:   Cardiology  CCM  Procedures: Head CT 7/16 was without acute change.  Antimicrobials:  Discontinued IV antibiotics  7/20. Started amoxicillin.  Subjective: Diarrhea. No abdominal pain. No dyspnea or chest pain.  Objective: Filed Vitals:   06/02/16 2150 06/03/16 0500 06/03/16 1430 06/03/16 1435  BP: 121/70 116/73 139/49   Pulse: 79 77 87   Temp: 98 F (36.7 C) 98.1 F (36.7 C) 97.8 F (36.6 C)   TempSrc: Oral Oral Oral   Resp: 18 18    Height:      Weight:    107.2 kg (236 lb 5.3 oz)  SpO2: 100% 97% 98%     Intake/Output Summary (Last 24 hours) at 06/03/16 1552 Last data filed at 06/03/16 0752  Gross per 24 hour  Intake    360 ml  Output   1800 ml  Net  -1440 ml   Filed Weights   05/27/16 0401 05/28/16 1236 06/03/16 1435  Weight: 118.434 kg (261 lb 1.6 oz) 115.894 kg (255 lb 8 oz) 107.2 kg (236 lb 5.3 oz)    Examination:  General exam: Pleasant elderly male lying comfortably propped up in bed. Respiratory system: Clear to auscultation. No increased work of breathing. Cardiovascular system: S1 & S2 heard, RRR. No JVD, murmurs, rubs, gallops or clicks.  Lower extremities with UNA boots- trace edema dorsal feet. Gastrointestinal system: Abdomen protuberant but nondistended, soft and nontender. No organomegaly or masses felt. Normal bowel sounds heard. Central nervous system: Alert and oriented 2. Extremities: Symmetric 5 x 5 power. Skin: No rashes, lesions or ulcers      Data Reviewed: I have personally reviewed following labs and imaging studies  CBC:  Recent Labs Lab 05/28/16 0328 05/29/16 0208 05/30/16 0321 05/31/16 0331 06/01/16 0529 06/02/16 0525  WBC 8.3 9.5 10.8* 10.8* 8.9 9.4  NEUTROABS 6.5  --   --   --   --   --   HGB 10.9* 10.8* 10.8* 11.2* 11.7* 11.8*  HCT 32.6* 32.0* 32.1* 32.8* 34.7* 35.5*  MCV 95.3 93.8 93.9 93.7 93.3 94.4  PLT 170 184 237 279 284 [id number]   Basic Metabolic Panel:  Recent Labs Lab 05/29/16 0208 05/30/16 0321 05/31/16 0331 06/01/16 0529 06/02/16 0525  NA 140 138 137 135 135  K 3.7 3.4* 3.7 3.9 4.0  CL 92* 92* 91* 90* 91*  CO2 37*  39* 39* 34* 35*  GLUCOSE 173* 136* 112* 102* 122*  BUN 20 19 19 20  23*  CREATININE 0.96 0.87 0.92 0.91 0.90  CALCIUM 8.5* 8.6* 8.8* 8.9 9.0   GFR: Estimated Creatinine Clearance: 85 mL/min (by C-G formula based on Cr of 0.9). Liver Function Tests:  Recent Labs Lab 05/30/16 0321  AST 37  ALT 40  ALKPHOS 46  BILITOT 0.8  PROT 6.5  ALBUMIN 2.9*   No results for input(s): LIPASE, AMYLASE in the last 168 hours.  Recent Labs Lab 05/29/16 1350  AMMONIA 23   Coagulation Profile: No results for input(s): INR, PROTIME in the last 168 hours. Cardiac Enzymes:  Recent Labs Lab 05/29/16 0208 05/29/16 0849  TROPONINI 0.31* 0.30*   BNP (last 3 results) No results for input(s): PROBNP in the last 8760 hours. HbA1C: No results for input(s): HGBA1C in the last 72 hours. CBG:  Recent Labs Lab 06/02/16 1115 06/02/16 1654 06/02/16 2113 06/03/16 0750 06/03/16 1156  GLUCAP 136* 93 170* 93 135*   Lipid Profile: No results for input(s): CHOL, HDL, LDLCALC, TRIG, CHOLHDL, LDLDIRECT in the last 72 hours. Thyroid Function Tests: No results for  input(s): TSH, T4TOTAL, FREET4, T3FREE, THYROIDAB in the last 72 hours. Anemia Panel: No results for input(s): VITAMINB12, FOLATE, FERRITIN, TIBC, IRON, RETICCTPCT in the last 72 hours. Sepsis Labs: No results for input(s): PROCALCITON, LATICACIDVEN in the last 168 hours.  Recent Results (from the past 240 hour(s))  Culture, blood (Routine X 2) w Reflex to ID Panel     Status: None   Collection Time: 05/25/16 12:46 PM  Result Value Ref Range Status   Specimen Description BLOOD RIGHT HAND  Final   Special Requests BOTTLES DRAWN AEROBIC AND ANAEROBIC 5CC  Final   Culture   Final    NO GROWTH 5 DAYS Performed at [facility]    Report Status 05/30/2016 FINAL  Final  Culture, blood (Routine X 2) w Reflex to ID Panel     Status: None   Collection Time: 05/25/16 12:46 PM  Result Value Ref Range Status   Specimen Description BLOOD  LEFT HAND  Final   Special Requests BOTTLES DRAWN AEROBIC AND ANAEROBIC 5CC  Final   Culture   Final    NO GROWTH 5 DAYS Performed at [facility]    Report Status 05/30/2016 FINAL  Final  C difficile quick scan w PCR reflex     Status: None   Collection Time: 05/31/16 11:40 AM  Result Value Ref Range Status   C Diff antigen NEGATIVE NEGATIVE Final   C Diff toxin NEGATIVE NEGATIVE Final   C Diff interpretation No C. difficile detected.  Final         Radiology Studies: No results found.      Scheduled Meds: . amoxicillin  500 mg Oral Q8H  . antiseptic oral rinse  7 mL Mouth Rinse BID  . aspirin  81 mg Oral Daily  . carvedilol  6.25 mg Oral BID WC  . enoxaparin (LOVENOX) injection  40 mg Subcutaneous Q24H  . feeding supplement (ENSURE ENLIVE)  237 mL Oral Q24H  . feeding supplement (PRO-STAT SUGAR FREE 64)  30 mL Oral BID  . furosemide  40 mg Oral BID  . insulin aspart  0-15 Units Subcutaneous TID WC  . lisinopril  5 mg Oral Daily  . potassium chloride  40 mEq Oral Daily   Continuous Infusions:    LOS: 10 days   25 minutes  Bonnell Public, MD. [facility] Pager [phone].  If 7PM-7AM, please contact night-coverage [email or web address] Password [email or web address] 06/03/2016, 3:52 PM

## 2016-06-03 NOTE — Care Management Note (Signed)
Case Management Note  Patient Details  Name: Theodore [name] MRN: [id number] Date of Birth: June 04, 1946  Subjective/Objective:  PT-recc HHPT. Patient provided w/HHC agency list to choose(patient is a very hard sleeper)Patient states he has a rw. Await choice for [place] would also recc HHRN-disease mgmnt.Await [place] orders.From home w/spouse.                  Action/Plan:d/c plan  Home w/HHC.   Expected Discharge Date:   (UNKNOWN)               Expected Discharge Plan:  [place]  In-House Referral:     Discharge planning Services  CM Consult  Post Acute Care Choice:  Durable Medical Equipment (rw) Choice offered to:  Patient  DME Arranged:    DME Agency:     HH Arranged:    [place] Agency:     Status of Service:  In process, will continue to follow  If discussed at Long Length of Stay Meetings, dates discussed:    Additional Comments:  Dessa Phi, RN 06/03/2016, 3:51 PM

## 2016-06-04 LAB — GLUCOSE, CAPILLARY
GLUCOSE-CAPILLARY: 98 mg/dL (ref 65–99)
Glucose-Capillary: 128 mg/dL — ABNORMAL HIGH (ref 65–99)

## 2016-06-04 MED ORDER — FUROSEMIDE 40 MG PO TABS: 40.0000 mg | ORAL_TABLET | Freq: Two times a day (BID) | ORAL | Status: DC

## 2016-06-04 MED ORDER — PRO-STAT SUGAR FREE PO LIQD: 30.0000 mL | Freq: Two times a day (BID) | ORAL | Status: DC

## 2016-06-04 MED ORDER — AMOXICILLIN 500 MG PO CAPS: 500.0000 mg | ORAL_CAPSULE | Freq: Three times a day (TID) | ORAL | Status: AC

## 2016-06-04 MED ORDER — POTASSIUM CHLORIDE ER 10 MEQ PO TBCR: 10.0000 meq | EXTENDED_RELEASE_TABLET | Freq: Every day | ORAL | Status: DC

## 2016-06-04 MED ORDER — ENSURE ENLIVE PO LIQD: 237.0000 mL | ORAL | Status: DC

## 2016-06-04 MED ORDER — CARVEDILOL 6.25 MG PO TABS: 6.2500 mg | ORAL_TABLET | Freq: Two times a day (BID) | ORAL | Status: DC

## 2016-06-04 NOTE — Discharge Summary (Signed)
Physician Discharge Summary  Theodore [name] [id number] DOB: 1946/03/17 DOA: 05/24/2016  PCP: [name], FNP  Admit date: 05/24/2016 Discharge date: 06/04/2016  Time spent: Greater than 30 minutes  Recommendations for Outpatient Follow-up:  1. Discharge patient home. 2. Home Physical Therapy. 3. Follow up with PCP within one week. 4. Check BMP at the PCP's office in 2 days.  Discharge Diagnoses:  Principal Problem:   Acute respiratory failure with hypoxia (HCC) Active Problems:   Type 2 diabetes mellitus with hyperglycemia (HCC)   Essential hypertension   RBBB   Sleep apnea by history- never followed through with sleep study   CAD (coronary artery disease)   Morbid obesity ([place])   Sepsis ([place])   Acute diastolic (congestive) heart failure (HCC)   Cellulitis of right lower extremity   Dyspnea   2d echo shows left ventricular diastolic dysfunction-grade 2   Bacteremia   Discharge Condition: Optimized.  Diet recommendation: Diabetic/Cardiac diet  Filed Weights   05/28/16 1236 06/03/16 1435 06/04/16 0627  Weight: 115.894 kg (255 lb 8 oz) 107.2 kg (236 lb 5.3 oz) 107.1 kg (236 lb 1.8 oz)    History of present illness: 70 year old male with medical history significant of metastatic prostate cancer, DM, CAD and PVD. Patient presented with altered mental status and fever. Collateral information revealed that the patient had been confused, lethargic and sleepy prior to admission. Bilateral lower extremities redness, edema and drainage for preceeding 3 weeks prior to admission were also reported. Patient saw his PCP 3 weeks prior to admission and was prescribe doxycycline, and one week prior to admission, ulna booth was advised. Chronic dyspnea and orthopnea were also reported.   Hospital Course: Patient was admitted for further assessment and management. Patient was managed with sepsis protocol. Blood culture eventually grew Streptococcus Agalactiae. Patient was initially  treated with IV antibiotics, but eventually transitioned to PO antibiotics. Origin of the sepsis was thought to be cellulitis. Patient's lasix was increased during the hospital stay. Cardiology team was also consulted to assist with patient's management. TEE was non revealing. Ulna booth was reapplied during the hospital stay. Patient will be discharged to the care of the PCP. Patient will need to have BMP rechecked in 2 days.  Procedures:  TEE.  Consultations:  Cardiology  Discharge Exam: Filed Vitals:   06/03/16 2216 06/04/16 0627  BP: 116/51 119/52  Pulse: 78 74  Temp: 98.6 F (37 C) 98.3 F (36.8 C)  Resp: 18 18    General: Not in distress. Obese Cardiovascular: S1S2 Respiratory: Clear to auscultation.  Discharge Instructions   Discharge Instructions    Diet - low sodium heart healthy    Complete by:  As directed      Diet Carb Modified    Complete by:  As directed      Discharge instructions    Complete by:  As directed   Check BMP in 2 days. Follow up with PCP within one week of discharge     Increase activity slowly    Complete by:  As directed           Current Discharge Medication List    START taking these medications   Details  Amino Acids-Protein Hydrolys (FEEDING SUPPLEMENT, PRO-STAT SUGAR FREE 64,) LIQD Take 30 mLs by mouth 2 (two) times daily. Qty: 900 mL, Refills: 0    amoxicillin (AMOXIL) 500 MG capsule Take 1 capsule (500 mg total) by mouth every 8 (eight) hours. Qty: 15 capsule, Refills: 0  carvedilol (COREG) 6.25 MG tablet Take 1 tablet (6.25 mg total) by mouth 2 (two) times daily with a meal. Qty: 60 tablet, Refills: 1    feeding supplement, ENSURE ENLIVE, (ENSURE ENLIVE) LIQD Take 237 mLs by mouth daily. Qty: 7110 mL, Refills: 12    potassium chloride (K-DUR) 10 MEQ tablet Take 1 tablet (10 mEq total) by mouth daily. Qty: 30 tablet, Refills: 0      CONTINUE these medications which have CHANGED   Details  furosemide (LASIX) 40 MG  tablet Take 1 tablet (40 mg total) by mouth 2 (two) times daily. Qty: 60 tablet, Refills: 1      CONTINUE these medications which have NOT CHANGED   Details  aspirin 325 MG tablet Take 325 mg by mouth every evening.     Leuprolide Acetate (LUPRON IJ) Inject as directed. 1 injection every 4 months     losartan (COZAAR) 100 MG tablet Take 50 mg by mouth daily.     metFORMIN (GLUCOPHAGE) 500 MG tablet Take 500 mg by mouth 2 (two) times daily with a meal.     Multiple Vitamins-Minerals (CENTRUM SILVER PO) Take 1 tablet by mouth daily.       STOP taking these medications     doxazosin (CARDURA) 8 MG tablet      metoprolol (LOPRESSOR) 100 MG tablet      clotrimazole-betamethasone (LOTRISONE) cream      doxycycline (VIBRA-TABS) 100 MG tablet        Allergies  Allergen Reactions  . Sulfonamide Derivatives     "reaction" as infant according to his mother   Follow-up Information    Follow up with Cecilie Kicks, NP. Go on 06/22/2016.   Specialties:  Cardiology, Radiology   Why:  @9 :00 am for post hospital while Dr. [name] in clinic   Contact information:   [place] [address] [phone]        The results of significant diagnostics from this hospitalization (including imaging, microbiology, ancillary and laboratory) are listed below for reference.    Significant Diagnostic Studies: Dg Chest 1 View  05/27/2016  CLINICAL DATA:  70 y/o M; shortness of breath and cough for a few days. History of smoking, hypertension, and coronary artery disease. No history of asthma or COPD. EXAM: CHEST 1 VIEW COMPARISON:  Chest radiograph dated 05/26/2016 and 12/29/2005. FINDINGS: Stable Left-greater-than-right basilar opacities probably representing atelectasis and small effusions. Stable pulmonary vascular congestion. No consolidation or pneumothorax. Left mid lung zone nodularity and sclerotic foci of the proximal humeri and right acromion again seen, not present in  2007. IMPRESSION: Stable basilar opacities probably representing atelectasis small effusions. Pulmonary vascular congestion. Densities projecting over left mid lung zone and sclerotic foci in the bones suggestive of sclerotic bony metastatic disease. Consider CT for further evaluation if clinically indicated. Electronically Signed   By: Kristine Garbe M.D.   On: 05/27/2016 09:51   Dg Chest 1 View  05/26/2016  CLINICAL DATA:  Shortness of breath, followup EXAM: CHEST 1 VIEW COMPARISON:  Chest x-ray of 05/25/2016 FINDINGS: The lungs are not as well opacified with more atelectasis at the lung bases. There may be mild pulmonary vascular congestion present. The questioned vague nodularity in the left mid lung may be bony in origin, but again a if necessary CT the chest could be performed to assess for pulmonary nodules. The heart is mildly enlarged and stable. There are degenerative changes throughout the thoracic spine. IMPRESSION: 1. Decreased aeration with increasing  bibasilar atelectasis. 2. Possible mild pulmonary vascular congestion. 3. Probable sclerotic bone metastasis. Cannot exclude pulmonary nodules. Consider CT of the chest if warranted. Electronically Signed   By: Ivar Drape M.D.   On: 05/26/2016 08:06   Ct Head Wo Contrast  05/29/2016  CLINICAL DATA:  Mental status changes. EXAM: CT HEAD WITHOUT CONTRAST TECHNIQUE: Contiguous axial images were obtained from the base of the skull through the vertex without intravenous contrast. COMPARISON:  None. FINDINGS: There is no evidence for acute hemorrhage, hydrocephalus, mass lesion, or abnormal extra-axial fluid collection. No definite CT evidence for acute infarction. Diffuse loss of parenchymal volume is consistent with atrophy. The visualized paranasal sinuses and mastoid air cells are clear. IMPRESSION: No acute intracranial abnormality. Electronically Signed   By: Misty Stanley M.D.   On: 05/29/2016 15:27   Ct Angio Chest Pe W Or Wo  Contrast  05/29/2016  CLINICAL DATA:  70 year old male with hypoxia. EXAM: CT ANGIOGRAPHY CHEST WITH CONTRAST TECHNIQUE: Multidetector CT imaging of the chest was performed using the standard protocol during bolus administration of intravenous contrast. Multiplanar CT image reconstructions and MIPs were obtained to evaluate the vascular anatomy. CONTRAST:  100 cc Isovue 370 COMPARISON:  Chest radiograph dated 05/27/2016 FINDINGS: Small bilateral pleural effusion with associated partial consolidative changes of the right lower lobes which may represent atelectasis versus pneumonia. There is emphysematous changes of the lungs with interstitial thickening primarily involving the upper lobes likely all chronic. There is no pneumothorax. The central airways are patent. There is atherosclerotic calcification of the thoracic aorta. No dissection or aneurysm. The origins of the great vessels of the aortic arch appear patent. Evaluation of the pulmonary arteries is limited due to respiratory motion artifact and suboptimal visualization of the peripheral branches. No definite pulmonary artery embolus identified. There is mild cardiomegaly. No pericardial effusion. There is coronary vascular calcification. No hilar or mediastinal adenopathy. Esophagus is grossly unremarkable. No thyroid nodules identified. There is no axillary adenopathy. The chest wall soft tissues appear unremarkable. There is osteopenia with degenerative changes of the spine. There are innumerable osseous sclerotic lesions most concerning for metastatic disease, likely related to prostate cancer. An 8 mm enhancing focus in the dome of the liver (series 4, image 79) is not well characterized but may represent a flash filling hemangioma or portal venous shunting. MRI without and with contrast is recommended for further characterization. Review of the MIP images confirms the above findings. IMPRESSION: No CT evidence of pulmonary embolism. Small bilateral  pleural effusions with partial consolidative changes of the lower lobes, atelectasis versus infiltrate. Emphysema with chronic interstitial changes, predominantly in the upper lobes. Extensive osseous metastatic disease. Small enhancing focus at the dome of the liver, incompletely characterized. MRI is recommended for further evaluation. Electronically Signed   By: Anner Crete M.D.   On: 05/29/2016 02:47   Dg Chest Port 1 View  05/25/2016  CLINICAL DATA:  Dyspnea EXAM: PORTABLE CHEST 1 VIEW COMPARISON:  05/24/2016 FINDINGS: Cardiac shadow is stable. The lungs are well aerated bilaterally. There again noted sclerotic metastatic lesions consistent with the patient's given clinical history. Some suggestion of nodularity within the left lung is noted. This may be related to overlying bony tissues but a few of the somewhat nodular areas appear to be extrinsic to the bone. Noncontrast CT may be helpful for further evaluation. Increased fullness in the right medial lung base is noted likely related to early infiltrate. IMPRESSION: Changes consistent with sclerotic bone mets. Slight nodularity in the  left lung which may be extrinsic to the bony structures. Additionally some increased density in the right medial lung base is noted. CT may be helpful for further evaluation. Electronically Signed   By: Inez Catalina M.D.   On: 05/25/2016 08:56   Dg Chest Port 1 View  05/24/2016  CLINICAL DATA:  Shortness of breath, fever and altered mental status for approximately 1-1/2 days. EXAM: PORTABLE CHEST 1 VIEW COMPARISON:  PA and lateral chest 05/13/2008. CT abdomen and pelvis 06/15/2015 FINDINGS: Innumerable sclerotic lesions are identified about the shoulders and in multiple ribs consistent with metastatic prostate carcinoma, new since the prior chest films. Bone metastases are seen on the prior CT Lung volumes are somewhat low. No pneumothorax or pleural effusion is seen. No consolidative process is identified. Heart  size is normal. IMPRESSION: No acute cardiopulmonary disease. Innumerable sclerotic osseous lesions consistent with metastatic prostate carcinoma. Electronically Signed   By: Inge Rise M.D.   On: 05/24/2016 11:09    Microbiology: Recent Results (from the past 240 hour(s))  Culture, blood (Routine X 2) w Reflex to ID Panel     Status: None   Collection Time: 05/25/16 12:46 PM  Result Value Ref Range Status   Specimen Description BLOOD RIGHT HAND  Final   Special Requests BOTTLES DRAWN AEROBIC AND ANAEROBIC 5CC  Final   Culture   Final    NO GROWTH 5 DAYS Performed at [facility]    Report Status 05/30/2016 FINAL  Final  Culture, blood (Routine X 2) w Reflex to ID Panel     Status: None   Collection Time: 05/25/16 12:46 PM  Result Value Ref Range Status   Specimen Description BLOOD LEFT HAND  Final   Special Requests BOTTLES DRAWN AEROBIC AND ANAEROBIC 5CC  Final   Culture   Final    NO GROWTH 5 DAYS Performed at [facility]    Report Status 05/30/2016 FINAL  Final  C difficile quick scan w PCR reflex     Status: None   Collection Time: 05/31/16 11:40 AM  Result Value Ref Range Status   C Diff antigen NEGATIVE NEGATIVE Final   C Diff toxin NEGATIVE NEGATIVE Final   C Diff interpretation No C. difficile detected.  Final     Labs: Basic Metabolic Panel:  Recent Labs Lab 05/29/16 0208 05/30/16 0321 05/31/16 0331 06/01/16 0529 06/02/16 0525  NA 140 138 137 135 135  K 3.7 3.4* 3.7 3.9 4.0  CL 92* 92* 91* 90* 91*  CO2 37* 39* 39* 34* 35*  GLUCOSE 173* 136* 112* 102* 122*  BUN 20 19 19 20  23*  CREATININE 0.96 0.87 0.92 0.91 0.90  CALCIUM 8.5* 8.6* 8.8* 8.9 9.0   Liver Function Tests:  Recent Labs Lab 05/30/16 0321  AST 37  ALT 40  ALKPHOS 46  BILITOT 0.8  PROT 6.5  ALBUMIN 2.9*   No results for input(s): LIPASE, AMYLASE in the last 168 hours.  Recent Labs Lab 05/29/16 1350  AMMONIA 23   CBC:  Recent Labs Lab 05/29/16 0208  05/30/16 0321 05/31/16 0331 06/01/16 0529 06/02/16 0525  WBC 9.5 10.8* 10.8* 8.9 9.4  HGB 10.8* 10.8* 11.2* 11.7* 11.8*  HCT 32.0* 32.1* 32.8* 34.7* 35.5*  MCV 93.8 93.9 93.7 93.3 94.4  PLT 184 237 279 284 307   Cardiac Enzymes:  Recent Labs Lab 05/29/16 0208 05/29/16 0849  TROPONINI 0.31* 0.30*   BNP: BNP (last 3 results)  Recent Labs  05/24/16 1031 05/25/16  1246  BNP 415.4* 214.3*    ProBNP (last 3 results) No results for input(s): PROBNP in the last 8760 hours.  CBG:  Recent Labs Lab 06/03/16 1156 06/03/16 1712 06/03/16 2223 06/04/16 0759 06/04/16 1231  GLUCAP 135* 99 126* 98 128*       Signed:  Dana Allan, MD  [facility] Pager #: [phone] 7PM-7AM contact night coverage as above

## 2016-06-05 NOTE — Progress Notes (Signed)
CM called and spoke with pt to offer choice of home health agency.  Pt chooses [facility] to render HHPT. Referral called to [facility] rep, Tiffany.  No other CM needs were communicated.

## 2016-06-06 ENCOUNTER — Telehealth: Payer: Self-pay | Admitting: Cardiovascular Disease

## 2016-06-06 NOTE — Telephone Encounter (Signed)
[place] for Skilled nurse Heart Failure and Diabetes Education.

## 2016-06-06 NOTE — Telephone Encounter (Signed)
Will route to Primary Care.

## 2016-06-06 NOTE — Telephone Encounter (Signed)
New message    Pt needs verbal orders for a skilled nurse for CHF Education and Diabetes Education.

## 2016-06-08 NOTE — Telephone Encounter (Signed)
Returned call and gave verbal ok per Greg. 

## 2016-06-10 ENCOUNTER — Telehealth: Payer: Self-pay

## 2016-06-10 NOTE — Telephone Encounter (Signed)
Verbal  Orders:  1x a week for 3 weeks for cellulitis.  [place]

## 2016-06-13 ENCOUNTER — Inpatient Hospital Stay: Payer: Medicare Other | Admitting: Family

## 2016-06-13 ENCOUNTER — Telehealth: Payer: Self-pay | Admitting: Emergency Medicine

## 2016-06-13 NOTE — Telephone Encounter (Signed)
Ok with me 

## 2016-06-13 NOTE — Telephone Encounter (Signed)
Pts wife called and stated her husband would rather see a MD instead of a NP. She was wondering if Dr Sharlet Salina would be willing to take him on as a Pt instead of [facility]. Please follow up thanks.

## 2016-06-14 NOTE — Telephone Encounter (Signed)
Called and gave [employer] ok.

## 2016-06-16 NOTE — Telephone Encounter (Signed)
Very sorry, I would not be able to accept at this time either

## 2016-06-16 NOTE — Telephone Encounter (Signed)
Unfortunately I am not taking new patients at this time

## 2016-06-16 NOTE — Telephone Encounter (Signed)
Unfortunately, I'm not able to accept any more new patients at this time.  I'm sorry! Thank you!  

## 2016-06-16 NOTE — Telephone Encounter (Signed)
Called pts wife back and stated Dr Sharlet Salina isnt excepting new pt. She stated her husband doesn't want to see Terri Piedra anymore and wants a MD to take over his care. Is anybody willing to take him on as a patient?

## 2016-06-16 NOTE — Telephone Encounter (Signed)
I am not accepting transfers

## 2016-06-20 ENCOUNTER — Inpatient Hospital Stay: Payer: Medicare Other | Admitting: Family

## 2016-06-21 ENCOUNTER — Telehealth: Payer: Self-pay | Admitting: Emergency Medicine

## 2016-06-21 NOTE — Telephone Encounter (Signed)
[facility] called and patient is refusing PT. [facility] will be discharging pt today. Just wanted to inform you. Thanks.

## 2016-06-22 ENCOUNTER — Ambulatory Visit: Payer: Medicare Other | Admitting: Podiatry

## 2016-06-22 ENCOUNTER — Encounter: Payer: Medicare Other | Admitting: Cardiology

## 2016-06-22 ENCOUNTER — Telehealth: Payer: Self-pay | Admitting: Emergency Medicine

## 2016-06-22 NOTE — Telephone Encounter (Signed)
Noted! Thank you

## 2016-06-22 NOTE — Telephone Encounter (Signed)
Called pats wife and let her know at this time we wouldn't be able to transfer his care to anyone else. Thanks.

## 2016-06-22 NOTE — Telephone Encounter (Signed)
[facility] called and patient has a lower left calf wound. Its about 3cm width, 2.5 cm  Length and  .1 depth stage 2. Patient also has 2-3 plus edema.  He wears una boots do you want to continue those . He is concerned about the wound he would be covering. They would like to continue seeing him once a week until at least 08/09/16 regarding the wound, swelling and edema. What do you advise?

## 2016-06-24 NOTE — Telephone Encounter (Signed)
Recommend continued wound care and will refer to the [facility] if indicated.

## 2016-06-24 NOTE — Telephone Encounter (Signed)
Called Doug back and let him know your response

## 2016-06-28 ENCOUNTER — Telehealth: Payer: Self-pay

## 2016-06-28 NOTE — Telephone Encounter (Signed)
Paperwork signed, faxed, copy sent to scan 

## 2016-06-28 NOTE — Telephone Encounter (Signed)
Home Health Cert/Plan of Care received (06/06/2016 - 08/04/2016) and placed on MD's desk for signature

## 2016-07-11 ENCOUNTER — Encounter: Payer: Self-pay | Admitting: Family

## 2016-07-11 ENCOUNTER — Other Ambulatory Visit (INDEPENDENT_AMBULATORY_CARE_PROVIDER_SITE_OTHER): Payer: Medicare Other

## 2016-07-11 ENCOUNTER — Ambulatory Visit (INDEPENDENT_AMBULATORY_CARE_PROVIDER_SITE_OTHER): Payer: Medicare Other | Admitting: Family

## 2016-07-11 VITALS — BP 152/86 | HR 85 | Temp 98.3°F | Resp 16 | Ht 65.0 in | Wt 245.0 lb

## 2016-07-11 DIAGNOSIS — L03115 Cellulitis of right lower limb: Secondary | ICD-10-CM | POA: Diagnosis not present

## 2016-07-11 DIAGNOSIS — R600 Localized edema: Secondary | ICD-10-CM | POA: Diagnosis not present

## 2016-07-11 LAB — BASIC METABOLIC PANEL
BUN: 20 mg/dL (ref 6–23)
CALCIUM: 9.1 mg/dL (ref 8.4–10.5)
CO2: 33 mEq/L — ABNORMAL HIGH (ref 19–32)
Chloride: 101 mEq/L (ref 96–112)
Creatinine, Ser: 1.02 mg/dL (ref 0.40–1.50)
GFR: 76.3 mL/min (ref >60.00)
GLUCOSE: 102 mg/dL — AB (ref 70–99)
Potassium: 4 mEq/L (ref 3.5–5.1)
SODIUM: 140 meq/L (ref 135–145)

## 2016-07-11 LAB — CBC
HCT: 36.3 % — ABNORMAL LOW (ref 39.0–52.0)
Hemoglobin: 12.4 g/dL — ABNORMAL LOW (ref 13.0–17.0)
MCHC: 34.2 g/dL (ref 30.0–36.0)
MCV: 92.9 fl (ref 78.0–100.0)
PLATELETS: 310 10*3/uL (ref 150.0–400.0)
RBC: 3.9 Mil/uL — ABNORMAL LOW (ref 4.22–5.81)
RDW: 14.9 % (ref 11.5–15.5)
WBC: 7.4 10*3/uL (ref 4.0–10.5)

## 2016-07-11 MED ORDER — CARVEDILOL 6.25 MG PO TABS: 6.2500 mg | ORAL_TABLET | Freq: Two times a day (BID) | ORAL | 1 refills | Status: DC

## 2016-07-11 MED ORDER — POTASSIUM CHLORIDE ER 10 MEQ PO TBCR: 10.0000 meq | EXTENDED_RELEASE_TABLET | Freq: Every day | ORAL | 0 refills | Status: DC

## 2016-07-11 MED ORDER — FUROSEMIDE 40 MG PO TABS: 40.0000 mg | ORAL_TABLET | Freq: Two times a day (BID) | ORAL | 1 refills | Status: DC

## 2016-07-11 NOTE — Progress Notes (Signed)
Subjective:    Patient ID: Theodore [name], male    DOB: 08/08/1946, 70 y.o.   MRN: [id number]  Chief Complaint  Patient presents with  . Hospitalization Follow-up    needs to figure out what medications he needs to be taking    HPI:  Theodore [name] is a 70 y.o. male who  has a past medical history of CAD (coronary artery disease); Cellulitis (10/18-20/2012); Diabetes mellitus; Diverticulitis (02/19/13); Gallstones; Hyperlipidemia; Hypertension; Myocardial infarction ([place]); Paroxysmal nocturnal dyspnea; Prostate cancer ([place]); and PVD (peripheral vascular disease) ([place]). and presents today for a hospitalization follow up.    Cellulitis / Sepsis - Previously evaluated in the office and treated with doxycycline for cellulitis and was evaluated in the ED and admitted to the hospital for the same. Upon presentation to the emergency room he was noted to have fever and be confused. There was gradual increased weakness, confusion, and shortness of breath over the last day and a half prior to presentation. Physical exam appearing ill and tachycardic. 2-3+ pitting edema noted bilateral lower extremities with erythema distally bilaterally. White blood cell count noted to be 20.9 and a BNP of 415. Mental status improved with IVF. Elevated lactate with concern for sepsis. Blood culture eventually grew a Streptococcus Agalactiae. He was transitioned to oral antibiotics with origin being the cellulitis. Lasix was increased and TEE was non-revealing. Unna boot was reapplied during the hospital stay. All hospital records, imaging and labs were reviewed in detail.   Since leaving the hospital he reports that his legs have been doing okay and continues to have his legs wrapped approximately once per week by [facility] RN. He continues to have lower extremity edema in his bilateral lower legs. Denies fevers. States that he has difficulty elevating his legs above his heart because he sleeps in his recliner. Notes  that the Home Health service does continually take pictures of his legs. He does have continued concern for infection. Ambulates with a cane. Has some confusion about what medications he should be taking.    Allergies  Allergen Reactions  . Sulfonamide Derivatives     "reaction" as infant according to his mother      Outpatient Medications Prior to Visit  Medication Sig Dispense Refill  . Amino Acids-Protein Hydrolys (FEEDING SUPPLEMENT, PRO-STAT SUGAR FREE 64,) LIQD Take 30 mLs by mouth 2 (two) times daily. 900 mL 0  . aspirin 325 MG tablet Take 325 mg by mouth every evening.     . feeding supplement, ENSURE ENLIVE, (ENSURE ENLIVE) LIQD Take 237 mLs by mouth daily. 7110 mL 12  . Leuprolide Acetate (LUPRON IJ) Inject as directed. 1 injection every 4 months     . losartan (COZAAR) 100 MG tablet Take 50 mg by mouth daily.     . metFORMIN (GLUCOPHAGE) 500 MG tablet Take 500 mg by mouth 2 (two) times daily with a meal.     . Multiple Vitamins-Minerals (CENTRUM SILVER PO) Take 1 tablet by mouth daily.     . carvedilol (COREG) 6.25 MG tablet Take 1 tablet (6.25 mg total) by mouth 2 (two) times daily with a meal. 60 tablet 1  . furosemide (LASIX) 40 MG tablet Take 1 tablet (40 mg total) by mouth 2 (two) times daily. 60 tablet 1  . potassium chloride (K-DUR) 10 MEQ tablet Take 1 tablet (10 mEq total) by mouth daily. 30 tablet 0   No facility-administered medications prior to visit.       Past Surgical  History:  Procedure Laterality Date  . [place]  . CHOLECYSTECTOMY    . PENILE PROSTHESIS  REMOVAL     swich(sphincter)...removed 04-2008, replaced 02-2009  . PROSTATE SURGERY    . TEE WITHOUT CARDIOVERSION N/A 06/01/2016   Procedure: TRANSESOPHAGEAL ECHOCARDIOGRAM (TEE);  Surgeon: Sanda Klein, MD;  Location: [facility] ENDOSCOPY;  Service: Cardiovascular;  Laterality: N/A;  . TONSILLECTOMY        Past Medical History:  Diagnosis Date  . CAD (coronary artery disease)   .  Cellulitis 10/18-20/2012   RLE   . Diabetes mellitus    controlled  . Diverticulitis 02/19/13  . Gallstones   . Hyperlipidemia   . Hypertension   . Myocardial infarction (H[facility])    inferior wall  . Paroxysmal nocturnal dyspnea   . Prostate cancer [facility])    prostate  . PVD (peripheral vascular disease) ([place])       Review of Systems  Constitutional: Negative for chills and fever.  Respiratory: Negative for chest tightness and shortness of breath.   Cardiovascular: Positive for leg swelling. Negative for chest pain and palpitations.      Objective:     BP (!) 152/86 (BP Location: Left Arm, Patient Position: Sitting, Cuff Size: Large)   Pulse 85   Temp 98.3 F (36.8 C) (Oral)   Resp 16   Ht 5\' 5"  (1.651 m)   Wt 245 lb (111.1 kg)   SpO2 94%   BMI 40.77 kg/m  Nursing note and vital signs reviewed.  Physical Exam  Constitutional: He is oriented to person, place, and time. He appears well-developed and well-nourished. No distress.  Cardiovascular: Normal rate, regular rhythm, normal heart sounds and intact distal pulses.   Bilateral lower extremities are wrapped with [place] and dressings applied by Home Health. Distal pulses and sensation are intact and appropriate.   Pulmonary/Chest: Effort normal and breath sounds normal.  Neurological: He is alert and oriented to person, place, and time.  Skin: Skin is warm and dry.  Psychiatric: He has a normal mood and affect. His behavior is normal. Judgment and thought content normal.       Assessment & Plan:   Problem List Items Addressed This Visit      Other   Cellulitis of right lower extremity - Primary    Symptoms of cellulitis appear improved since leaving the hospital. Has follow up with cardiology in 1 week. Concern for lower extremity edema resulting in his cellulitis. Continue current dosage of furosemide and potassium. Keep legs elevated and reduce sodium in diet. Concern for possible lymph edema or venous insufficiency.  Continue to monitor for signs of infection. Will attempt to obtain most recent pictures from home health service. Obtain BMET and CBC.       Relevant Orders   Basic Metabolic Panel (BMET) (Completed)   CBC (Completed)   Edema of left lower extremity    Continues to experience left lower extremity edema as wrapped by home health. There is also concern for lymphedema and possible venous insufficiency. Continue current dosage of furosemide and potassium. Continue to monitor.       Other Visit Diagnoses   None.      I am having Theodore [name] maintain his aspirin, Multiple Vitamins-Minerals (CENTRUM SILVER PO), Leuprolide Acetate (LUPRON IJ), losartan, metFORMIN, feeding supplement (ENSURE ENLIVE), feeding supplement (PRO-STAT SUGAR FREE 64), potassium chloride, carvedilol, and furosemide.   Meds ordered this encounter  Medications  . potassium chloride (K-DUR) 10 MEQ tablet  Sig: Take 1 tablet (10 mEq total) by mouth daily.    Dispense:  90 tablet    Refill:  0    Order Specific Question:   Supervising Provider    Answer:   Pricilla Holm A [id number]  . carvedilol (COREG) 6.25 MG tablet    Sig: Take 1 tablet (6.25 mg total) by mouth 2 (two) times daily with a meal.    Dispense:  60 tablet    Refill:  1    Order Specific Question:   Supervising Provider    Answer:   Pricilla Holm A [id number]  . furosemide (LASIX) 40 MG tablet    Sig: Take 1 tablet (40 mg total) by mouth 2 (two) times daily.    Dispense:  60 tablet    Refill:  1    Order Specific Question:   Supervising Provider    Answer:   Pricilla Holm A [id number]     Follow-up: Return in about 1 month (around 08/11/2016), or if symptoms worsen or fail to improve.  [name], FNP

## 2016-07-11 NOTE — Assessment & Plan Note (Addendum)
Symptoms of cellulitis appear improved since leaving the hospital. Has follow up with cardiology in 1 week. Concern for lower extremity edema resulting in his cellulitis. Continue current dosage of furosemide and potassium. Keep legs elevated and reduce sodium in diet. Concern for possible lymph edema or venous insufficiency. Continue to monitor for signs of infection. Will attempt to obtain most recent pictures from home health service. Obtain BMET and CBC.

## 2016-07-11 NOTE — Patient Instructions (Addendum)
Thank you for choosing [facility].  SUMMARY AND INSTRUCTIONS:  Please continue to take your medication as prescribed.  Follow up with cardiology.  Continue to keep you legs elevated.  We will work to get the pictures of your legs.   Medication:  Your prescription(s) have been submitted to your pharmacy or been printed and provided for you. Please take as directed and contact our office if you believe you are having problem(s) with the medication(s) or have any questions.  Labs:  Please stop by the lab on the lower level of the building for your blood work. Your results will be released to [facility] (or called to you) after review, usually within 72 hours after test completion. If any changes need to be made, you will be notified at that same time.  1.) The lab is open from 7:30am to 5:30 pm Monday-Friday 2.) No appointment is necessary 3.) Fasting (if needed) is 6-8 hours after food and drink; black coffee and water are okay   Follow up:  If your symptoms worsen or fail to improve, please contact our office for further instruction, or in case of emergency go directly to the emergency room at the closest medical facility.

## 2016-07-11 NOTE — Assessment & Plan Note (Signed)
Continues to experience left lower extremity edema as wrapped by home health. There is also concern for lymphedema and possible venous insufficiency. Continue current dosage of furosemide and potassium. Continue to monitor.

## 2016-07-20 ENCOUNTER — Ambulatory Visit (INDEPENDENT_AMBULATORY_CARE_PROVIDER_SITE_OTHER): Payer: Medicare Other | Admitting: Nurse Practitioner

## 2016-07-20 ENCOUNTER — Encounter: Payer: Self-pay | Admitting: Nurse Practitioner

## 2016-07-20 VITALS — BP 120/60 | HR 101 | Ht 65.0 in | Wt 241.8 lb

## 2016-07-20 DIAGNOSIS — I5032 Chronic diastolic (congestive) heart failure: Secondary | ICD-10-CM | POA: Diagnosis not present

## 2016-07-20 LAB — CBC
HCT: 36 % — ABNORMAL LOW (ref 38.5–50.0)
Hemoglobin: 12.1 g/dL — ABNORMAL LOW (ref 13.2–17.1)
MCH: 31.3 pg (ref 27.0–33.0)
MCHC: 33.6 g/dL (ref 32.0–36.0)
MCV: 93.3 fL (ref 80.0–100.0)
MPV: 9.7 fL (ref 7.5–12.5)
Platelets: 282 10*3/uL (ref 140–400)
RBC: 3.86 MIL/uL — ABNORMAL LOW (ref 4.20–5.80)
RDW: 14.8 % (ref 11.0–15.0)
WBC: 9.1 10*3/uL (ref 3.8–10.8)

## 2016-07-20 LAB — BASIC METABOLIC PANEL
BUN: 26 mg/dL — ABNORMAL HIGH (ref 7–25)
CO2: 29 mmol/L (ref 20–31)
Calcium: 9.1 mg/dL (ref 8.6–10.3)
Chloride: 101 mmol/L (ref 98–110)
Creat: 1.42 mg/dL — ABNORMAL HIGH (ref 0.70–1.18)
Glucose, Bld: 111 mg/dL — ABNORMAL HIGH (ref 65–99)
Potassium: 4.1 mmol/L (ref 3.5–5.3)
Sodium: 142 mmol/L (ref 135–146)

## 2016-07-20 LAB — HEPATIC FUNCTION PANEL
ALT: 16 U/L (ref 9–46)
AST: 15 U/L (ref 10–35)
Albumin: 3.5 g/dL — ABNORMAL LOW (ref 3.6–5.1)
Alkaline Phosphatase: 65 U/L (ref 40–115)
Bilirubin, Direct: 0.1 mg/dL (ref <=0.2)
Indirect Bilirubin: 0.3 mg/dL (ref 0.2–1.2)
Total Bilirubin: 0.4 mg/dL (ref 0.2–1.2)
Total Protein: 6.6 g/dL (ref 6.1–8.1)

## 2016-07-20 LAB — BRAIN NATRIURETIC PEPTIDE: Brain Natriuretic Peptide: 65.2 pg/mL (ref <100)

## 2016-07-20 NOTE — Progress Notes (Addendum)
CARDIOLOGY OFFICE NOTE  Date:  07/20/2016    Theodore [name] Date of Birth: July 09, 1946 Medical Record [id number]  PCP:  [name], FNP  Cardiologist:  [name]    Chief Complaint  Patient presents with  . Congestive Heart Failure    Post hospital visit - seen for Dr. [name]    History of Present Illness: Theodore [name] is a 70 y.o. male who presents today for a post hospital visit. Seen for Dr. [name]. He has not seen Dr. [name] since 2012.   He has a history of metastatic prostate cancer, DM, CAD and PVD.   Admitted back in July with altered mental status and fever. Treated for cellulitis/sepsis. Blood cultures were positive. Cardiology called for CHF and + troponin. TEE unrevealing for endocarditis. Outpatient f/u with Dr. [name] who will determine need of stress test.  Comes in today. Here with his wife. Pretty argumentative with me and his wife. He is pretty confused about medicines. We do not have an accurate list. He remains short of breath with exertion. Still with swelling. Says his breathing has gotten worse since having this cramp in the right side of the chest on Monday night. This has now gotten better after he used a heating pad. Sleeps in recliner - that is chronic. He does not elevate his legs. Does "not move" according to his wife. Wife feels like he has basically given up and "has a death wish". Home health comes once a week - no real indication if his legs are getting better - not able to tell me - apparently still having blisters. Loves salt - eating country ham every day along with other highly processed foods.   Past Medical History:  Diagnosis Date  . CAD (coronary artery disease)   . Cellulitis 10/18-20/2012   RLE   . Diabetes mellitus    controlled  . Diverticulitis 02/19/13  . Gallstones   . Hyperlipidemia   . Hypertension   . Myocardial infarction (H[facility])    inferior wall  . Paroxysmal nocturnal dyspnea   . Prostate cancer [facility])    prostate    . PVD (peripheral vascular disease) ([place])     Past Surgical History:  Procedure Laterality Date  . [place]  . CHOLECYSTECTOMY    . PENILE PROSTHESIS  REMOVAL     swich(sphincter)...removed 04-2008, replaced 02-2009  . PROSTATE SURGERY    . TEE WITHOUT CARDIOVERSION N/A 06/01/2016   Procedure: TRANSESOPHAGEAL ECHOCARDIOGRAM (TEE);  Surgeon: Sanda Klein, MD;  Location: [facility] ENDOSCOPY;  Service: Cardiovascular;  Laterality: N/A;  . TONSILLECTOMY       Medications: Current Outpatient Prescriptions  Medication Sig Dispense Refill  . carvedilol (COREG) 6.25 MG tablet Take 1 tablet (6.25 mg total) by mouth 2 (two) times daily with a meal. 60 tablet 1  . furosemide (LASIX) 40 MG tablet Take 1 tablet (40 mg total) by mouth 2 (two) times daily. 60 tablet 1  . losartan (COZAAR) 100 MG tablet Take 50 mg by mouth daily.     . metFORMIN (GLUCOPHAGE) 500 MG tablet Take 500 mg by mouth 2 (two) times daily with a meal.     . potassium chloride (K-DUR) 10 MEQ tablet Take 1 tablet (10 mEq total) by mouth daily. 90 tablet 0  . aspirin 325 MG tablet Take 325 mg by mouth every evening.     Marland Kitchen Leuprolide Acetate (LUPRON IJ) Inject as directed. 1 injection every 4 months  No current facility-administered medications for this visit.     Allergies: Allergies  Allergen Reactions  . Sulfonamide Derivatives     "reaction" as infant according to his mother    Social History: The patient  reports that he quit smoking about 30 years ago. His smoking use included Cigarettes. He has never used smokeless tobacco. He reports that he does not drink alcohol or use drugs.   Family History: The patient's family history includes Alcoholism in his mother; Diabetes in his brother and father; Gout in his brother; Heart disease in his father; Kidney disease in his brother; Pneumonia in his mother; Seizures in his father; Stroke in his father.   Review of Systems: Please see the history of  present illness.   Otherwise, the review of systems is positive for none.   All other systems are reviewed and negative.   Physical Exam: VS:  BP 120/60   Pulse (!) 101   Ht 5\' 5"  (1.651 m)   Wt 241 lb 12.8 oz (109.7 kg)   BMI 40.24 kg/m  .  BMI Body mass index is 40.24 kg/m.  Wt Readings from Last 3 Encounters:  07/20/16 241 lb 12.8 oz (109.7 kg)  07/11/16 245 lb (111.1 kg)  06/04/16 236 lb 1.8 oz (107.1 kg)   Oxygen sat 91% on RA noted.   General: Chronically ill appearing but alert and in no acute distress. Color is pale.   HEENT: Normal.  Neck: Thick.  Cardiac: Regular rate and rhythm. Rate is a little fast. Legs are wrapped bilaterally.  Respiratory:  Lungs are fairly clear but with diminished breath sounds due to body habitus.  GI: Obese. Soft and nontender.  MS: No deformity or atrophy. Gait slow. ROM intact.  Skin: Warm and dry. Color is sallow Neuro:  Strength and sensation are intact and no gross focal deficits noted.  Psych: Alert, appropriate and with normal affect.   LABORATORY DATA:  EKG:  EKG is ordered today. This shows NSR - rate of 98. Inferior Q's noted - unchanged - rate is a little slower than last tracing.   Lab Results  Component Value Date   WBC 7.4 07/11/2016   HGB 12.4 (L) 07/11/2016   HCT 36.3 (L) 07/11/2016   PLT 310.0 07/11/2016   GLUCOSE 102 (H) 07/11/2016   CHOL 130 05/06/2016   TRIG 111 05/06/2016   HDL 62 05/06/2016   LDLCALC 46 05/06/2016   ALT 40 05/30/2016   AST 37 05/30/2016   NA 140 07/11/2016   K 4.0 07/11/2016   CL 101 07/11/2016   CREATININE 1.02 07/11/2016   BUN 20 07/11/2016   CO2 33 (H) 07/11/2016   TSH 2.20 07/08/2015   INR 1.38 05/24/2016   HGBA1C 6.5 04/15/2016   MICROALBUR 1.0 07/08/2015    BNP (last 3 results)  Recent Labs  05/24/16 1031 05/25/16 1246  BNP 415.4* 214.3*    ProBNP (last 3 results) No results for input(s): PROBNP in the last 8760 hours.   Other Studies Reviewed  Today:  Transesophageal Echocardiography 06/01/16 Study Conclusions  - Left ventricle: There was mild concentric hypertrophy. Systolic  function was normal. The estimated ejection fraction was in the  range of 55% to 60%. Wall motion was normal; there were no  regional wall motion abnormalities. - Aortic valve: No evidence of vegetation. - Mitral valve: No evidence of vegetation. - Left atrium: The atrium was mildly dilated. No evidence of  thrombus in the atrial cavity or appendage. - Right  atrium: No evidence of thrombus in the atrial cavity or  appendage. - Atrial septum: No defect or patent foramen ovale was identified. - Tricuspid valve: No evidence of vegetation. - Pulmonic valve: No evidence of vegetation.  Impressions:  - No evidence of endocarditis.   Echo Study Conclusions 05/2016  - Left ventricle: The cavity size was normal. There was moderate   focal basal hypertrophy of the septum. Systolic function was   vigorous. The estimated ejection fraction was in the range of 65%   to 70%. Wall motion was normal; there were no regional wall   motion abnormalities. Features are consistent with a pseudonormal   left ventricular filling pattern, with concomitant abnormal   relaxation and increased filling pressure (grade 2 diastolic   dysfunction). Doppler parameters are consistent with elevated   ventricular end-diastolic filling pressure. - Aortic root: The aortic root was normal in size. - Mitral valve: Calcified annulus. Mildly thickened leaflets . - Left atrium: The atrium was mildly dilated. - Right atrium: The atrium was normal in size. - Tricuspid valve: There was mild regurgitation. - Pulmonary arteries: Systolic pressure was within the normal   range. - Inferior vena cava: The vessel was normal in size. - Pericardium, extracardiac: There was no pericardial effusion.  Impressions:  - limited study quality secondary to tachycardia during the    acquisition.  Assessment/Plan:  1. Sepsis: 2/2 to bacteremia. Now off of antibiotics - plan per PCP - getting weekly dressing changes.   2. Bacteremia: 2/2 bilateral LE cellulitis. Blood cultures + for gram positive cocci, streptococcus species. No significant valvular abnormalities nor vegetations noted on 2D echo.  TEE without endocarditis.   3. Acute on chronic Diastolic CHF: 2D Echo this last admit demonstrated vigorous LV systolic function with an EF of 65-70%. Grade 2DD was noted. TEE showed LV EF of 55-60%, no WM abnormality.  [place] lab today. On Coreg/Losartan/Lasix.    4. Elevated Troponin: 0.11>0.41>>0.45>>0.48>>1.04. Likely secondary to demand ischemia in the setting of sepsis/ bacteremia. 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 He denies any recent CP. Some atypical right sided cramping earlier this week - now resolved. Had normal EF without WM abnormality - may need stress testing but would like to see him improved a little bit more before proceeding. Salt restriction is imperative. I do not think he is willing to make changes after listening to his wife.    5. Hypokalemia: rechecking lab today  Current medicines are reviewed with the patient today.  The patient does not have concerns regarding medicines other than what has been noted above.  The following changes have been made:  See above.  Labs/ tests ordered today include:    Orders Placed This Encounter  Procedures  . Brain natriuretic peptide  . Basic metabolic panel  . CBC  . Hepatic function panel  . EKG 12-Lead     Disposition:   FU with Dr. [name] in about 1 month - will consider arranging stress testing at that time.   Patient is agreeable to this plan and will call if any problems develop in the interim.   Signed: Burtis Junes, RN, ANP-C 07/20/2016 1:59 PM  [facility] [address] [place], [place]  [address] Phone: [phone] Fax: [phone]

## 2016-07-20 NOTE — Patient Instructions (Addendum)
We will be checking the following labs today - BMET, CBC, BNP  Medication Instructions:    Continue with your current medicines.   Go by this list of medicines    Testing/Procedures To Be Arranged:  N/A  Follow-Up:   See Dr. [name] in one month - will consider stress testing    Other Special Instructions:   Would really like for you to cut back on your salt use.     If you need a refill on your cardiac medications before your next appointment, please call your pharmacy.   Call the [place] office at [phone] if you have any questions, problems or concerns.

## 2016-07-21 ENCOUNTER — Encounter: Payer: Self-pay | Admitting: Podiatry

## 2016-07-21 ENCOUNTER — Ambulatory Visit (INDEPENDENT_AMBULATORY_CARE_PROVIDER_SITE_OTHER): Payer: Medicare Other | Admitting: Podiatry

## 2016-07-21 DIAGNOSIS — M79676 Pain in unspecified toe(s): Secondary | ICD-10-CM

## 2016-07-21 DIAGNOSIS — E1140 Type 2 diabetes mellitus with diabetic neuropathy, unspecified: Secondary | ICD-10-CM

## 2016-07-21 DIAGNOSIS — B351 Tinea unguium: Secondary | ICD-10-CM

## 2016-07-21 NOTE — Progress Notes (Signed)
Patient ID: Theodore [name], male   DOB: 07-14-1946, 70 y.o.   MRN: [id number] Complaint:  Visit Type: Patient returns to my office for continued preventative foot care services. Complaint: Patient states" my nails have grown long and thick and become painful to walk and wear shoes" Patient has been diagnosed with DM with no foot complications. The patient presents for preventative foot care services. No changes to ROS.  Patient has been in hospital. Podiatric Exam: Vascular: dorsalis pedis and posterior tibial pulses are not  palpable bilateral. Capillary return is immediate. Temperature gradient is WNL.   Sensorium: Normal Semmes Weinstein monofilament test. Normal tactile sensation bilaterally. Nail Exam: Pt has thick disfigured discolored nails with subungual debris noted bilateral entire nail hallux  Ulcer Exam: There is no evidence of ulcer or pre-ulcerative changes or infection. Orthopedic Exam: Muscle tone and strength are WNL. No limitations in general ROM. No crepitus or effusions noted. Foot type and digits show no abnormalities. Bony prominences are unremarkable. Skin: No Porokeratosis. No infection or ulcers  Diagnosis:  Onychomycosis, , Pain in right toe, pain in left toes  Treatment & Plan Procedures and Treatment: Consent by patient was obtained for treatment procedures. The patient understood the discussion of treatment and procedures well. All questions were answered thoroughly reviewed. Debridement of mycotic and hypertrophic toenails, 1 through 5 bilateral and clearing of subungual debris. No ulceration, no infection noted.  Return Visit-Office Procedure: Patient instructed to return to the office for a follow up visit 3 months for continued evaluation and treatment.   Gardiner Barefoot DPM

## 2016-08-02 ENCOUNTER — Encounter: Payer: Self-pay | Admitting: Cardiovascular Disease

## 2016-08-10 NOTE — Progress Notes (Signed)
CARDIOLOGY OFFICE NOTE  Date:  08/15/2016    Theodore [name] Date of Birth: 1946-10-08 Medical Record [id number]  PCP:  [name], FNP  Cardiologist:  [name]    Chief Complaint  Patient presents with  . Chronic Diastolic Heart Failure  . Establish Care    History of Present Illness: Theodore [name] is a 70 y.o. male who presents today for a post hospital visit.  I have not seen since 2012  Seen by PA 07/20/16  He has a history of metastatic prostate cancer, DM, CAD and PVD.   Admitted back in July with altered mental status and fever. Treated for cellulitis/sepsis. Blood cultures were positive. Cardiology called for CHF and + troponin. TEE unrevealing for endocarditis.   CAD distant Cath 1998 with occluded collateralized RCA no left sided disease    He remains short of breath with exertion. Still with swelling. Says his breathing has gotten worse since having this cramp in the right side of the chest on Monday night. This has now gotten better after he used a heating pad. Sleeps in recliner - that is chronic. He does not elevate his legs. Does "not move" according to his wife. Wife feels like he has basically given up and "has a death wish". Home health comes once a week - no real indication if his legs are getting better - not able to tell me - apparently still having blisters. Loves salt - eating country ham every day along with other highly processed foods.   Past Medical History:  Diagnosis Date  . CAD (coronary artery disease)   . Cellulitis 10/18-20/2012   RLE   . Diabetes mellitus    controlled  . Diverticulitis 02/19/13  . Gallstones   . Hyperlipidemia   . Hypertension   . Myocardial infarction    inferior wall  . Paroxysmal nocturnal dyspnea   . Prostate cancer [facility])    prostate  . PVD (peripheral vascular disease) ([place])     Past Surgical History:  Procedure Laterality Date  . [place]  . CHOLECYSTECTOMY    . PENILE PROSTHESIS   REMOVAL     swich(sphincter)...removed 04-2008, replaced 02-2009  . PROSTATE SURGERY    . TEE WITHOUT CARDIOVERSION N/A 06/01/2016   Procedure: TRANSESOPHAGEAL ECHOCARDIOGRAM (TEE);  Surgeon: Sanda Klein, MD;  Location: [facility] ENDOSCOPY;  Service: Cardiovascular;  Laterality: N/A;  . TONSILLECTOMY       Medications: Current Outpatient Prescriptions  Medication Sig Dispense Refill  . amoxicillin-clavulanate (AUGMENTIN) 875-125 MG tablet Take 1 tablet by mouth 2 (two) times daily. 20 tablet 0  . aspirin 325 MG tablet Take 325 mg by mouth every evening.     . carvedilol (COREG) 6.25 MG tablet Take 1 tablet (6.25 mg total) by mouth 2 (two) times daily with a meal. 60 tablet 1  . furosemide (LASIX) 40 MG tablet Take 1 tablet (40 mg total) by mouth 2 (two) times daily. 60 tablet 1  . Leuprolide Acetate (LUPRON IJ) Inject as directed. 1 injection every 4 months     . losartan (COZAAR) 100 MG tablet Take 50 mg by mouth daily.     . metFORMIN (GLUCOPHAGE) 500 MG tablet Take 500 mg by mouth daily.     . potassium chloride (K-DUR) 10 MEQ tablet Take 1 tablet (10 mEq total) by mouth daily. 90 tablet 0   No current facility-administered medications for this visit.     Allergies: Allergies  Allergen Reactions  .  Sulfonamide Derivatives     "reaction" as infant according to his mother    Social History: The patient  reports that he quit smoking about 30 years ago. His smoking use included Cigarettes. He has never used smokeless tobacco. He reports that he does not drink alcohol or use drugs.   Family History: The patient's family history includes Alcoholism in his mother; Diabetes in his brother and father; Gout in his brother; Heart disease in his father; Kidney disease in his brother; Pneumonia in his mother; Seizures in his father; Stroke in his father.   Review of Systems: Please see the history of present illness.   Otherwise, the review of systems is positive for none.   All other systems  are reviewed and negative.   Physical Exam: VS:  BP 110/62   Pulse 74   Ht 5\' 5"  (1.651 m)   Wt 107.9 kg (237 lb 12.8 oz)   SpO2 (!) 84%   BMI 39.57 kg/m  .  BMI Body mass index is 39.57 kg/m.  Wt Readings from Last 3 Encounters:  08/15/16 107.9 kg (237 lb 12.8 oz)  08/11/16 106.6 kg (235 lb)  07/20/16 109.7 kg (241 lb 12.8 oz)   Oxygen sat 91% on RA noted.   General: Chronically ill appearing but alert and in no acute distress. Color is pale.   HEENT: Normal.  Neck: Thick.  Cardiac: Regular rate and rhythm. Rate is a little fast. Legs are wrapped bilaterally.  Respiratory:  Lungs are fairly clear but with diminished breath sounds due to body habitus.  GI: Obese. Soft and nontender.  MS: No deformity or atrophy. Gait slow. ROM intact.  Skin: Warm and dry. Color is sallow Neuro:  Strength and sensation are intact and no gross focal deficits noted.  Psych: Alert, appropriate and with normal affect. Plus 2 RLE edema and LLE wrapped    LABORATORY DATA:  EKG:  07/20/16  NSR - rate of 98. Inferior Q's noted - unchanged - rate is a little slower than last tracing.   Lab Results  Component Value Date   WBC 9.1 07/20/2016   HGB 12.1 (L) 07/20/2016   HCT 36.0 (L) 07/20/2016   PLT 282 07/20/2016   GLUCOSE 111 (H) 07/20/2016   CHOL 130 05/06/2016   TRIG 111 05/06/2016   HDL 62 05/06/2016   LDLCALC 46 05/06/2016   ALT 16 07/20/2016   AST 15 07/20/2016   NA 142 07/20/2016   K 4.1 07/20/2016   CL 101 07/20/2016   CREATININE 1.42 (H) 07/20/2016   BUN 26 (H) 07/20/2016   CO2 29 07/20/2016   TSH 2.20 07/08/2015   INR 1.38 05/24/2016   HGBA1C 6.5 04/15/2016   MICROALBUR 1.0 07/08/2015    BNP (last 3 results)  Recent Labs  05/24/16 1031 05/25/16 1246 07/20/16 1400  BNP 415.4* 214.3* 65.2    ProBNP (last 3 results) No results for input(s): PROBNP in the last 8760 hours.   Other Studies Reviewed Today:  Transesophageal Echocardiography 06/01/16 Study  Conclusions  - Left ventricle: There was mild concentric hypertrophy. Systolic  function was normal. The estimated ejection fraction was in the  range of 55% to 60%. Wall motion was normal; there were no  regional wall motion abnormalities. - Aortic valve: No evidence of vegetation. - Mitral valve: No evidence of vegetation. - Left atrium: The atrium was mildly dilated. No evidence of  thrombus in the atrial cavity or appendage. - Right atrium: No evidence of thrombus in  the atrial cavity or  appendage. - Atrial septum: No defect or patent foramen ovale was identified. - Tricuspid valve: No evidence of vegetation. - Pulmonic valve: No evidence of vegetation.  Impressions:  - No evidence of endocarditis.   Echo Study Conclusions 05/2016  - Left ventricle: The cavity size was normal. There was moderate   focal basal hypertrophy of the septum. Systolic function was   vigorous. The estimated ejection fraction was in the range of 65%   to 70%. Wall motion was normal; there were no regional wall   motion abnormalities. Features are consistent with a pseudonormal   left ventricular filling pattern, with concomitant abnormal   relaxation and increased filling pressure (grade 2 diastolic   dysfunction). Doppler parameters are consistent with elevated   ventricular end-diastolic filling pressure. - Aortic root: The aortic root was normal in size. - Mitral valve: Calcified annulus. Mildly thickened leaflets . - Left atrium: The atrium was mildly dilated. - Right atrium: The atrium was normal in size. - Tricuspid valve: There was mild regurgitation. - Pulmonary arteries: Systolic pressure was within the normal   range. - Inferior vena cava: The vessel was normal in size. - Pericardium, extracardiac: There was no pericardial effusion.  Impressions:  - limited study quality secondary to tachycardia during the   acquisition.  Assessment/Plan:  1. Sepsis: 2/2 to  bacteremia. Now off of antibiotics - plan per PCP - getting weekly dressing changes.   2. Bacteremia: 2/2 bilateral LE cellulitis. Blood cultures + for gram positive cocci, streptococcus species. No significant valvular abnormalities nor vegetations noted on 2D echo.  TEE without endocarditis.   3. Acute on chronic Diastolic CHF: 2D Echo this last admit demonstrated vigorous LV systolic function with an EF of 65-70%. Grade 2DD was noted. TEE showed LV EF of 55-60%, no WM abnormality. On Coreg/Losartan/Lasix.    4. Elevated Troponin: 0.11>0.41>>0.45>>0.48>>1.04. Likely secondary to demand ischemia in the setting of sepsis/ bacteremia. 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 He denies any recent CP. Some atypical right sided cramping now resolved. Had normal EF without WM abnormality  Salt restriction is imperative. I do not think he is willing to make changes after listening to his wife.  Will consider lexiscan in 3 months will likely be positive from RCA collaterals No chest pain at this time   5. Hypokalemia:  Improved  Lab Results  Component Value Date   CREATININE 1.42 (H) 07/20/2016   BUN 26 (H) 07/20/2016   NA 142 07/20/2016   K 4.1 07/20/2016   CL 101 07/20/2016   CO2 29 07/20/2016     F/u with me in 3 months consider myovue    Jenkins Rouge

## 2016-08-11 ENCOUNTER — Ambulatory Visit (INDEPENDENT_AMBULATORY_CARE_PROVIDER_SITE_OTHER): Payer: Medicare Other | Admitting: Family

## 2016-08-11 ENCOUNTER — Encounter: Payer: Self-pay | Admitting: Family

## 2016-08-11 VITALS — BP 100/68 | HR 94 | Temp 98.2°F | Resp 16 | Ht 65.0 in | Wt 235.0 lb

## 2016-08-11 DIAGNOSIS — K047 Periapical abscess without sinus: Secondary | ICD-10-CM | POA: Insufficient documentation

## 2016-08-11 DIAGNOSIS — L02419 Cutaneous abscess of limb, unspecified: Secondary | ICD-10-CM

## 2016-08-11 DIAGNOSIS — L03119 Cellulitis of unspecified part of limb: Secondary | ICD-10-CM | POA: Diagnosis not present

## 2016-08-11 MED ORDER — AMOXICILLIN-POT CLAVULANATE 875-125 MG PO TABS: 1.0000 | ORAL_TABLET | Freq: Two times a day (BID) | ORAL | 0 refills | Status: DC

## 2016-08-11 NOTE — Assessment & Plan Note (Signed)
Leg edema is significantly improved with increased furosemide. No evidence of infection noted on exam. Continue to wrap left leg, keep elevated, and reduce sodium in diet.

## 2016-08-11 NOTE — Assessment & Plan Note (Signed)
Dental infection from poor dental hygiene with no evidence of abscess although significant redness. Start Augmentin. Recommend hydrogen peroxide rinses. Follow up with dentistry as able.

## 2016-08-11 NOTE — Progress Notes (Signed)
Subjective:    Patient ID: Theodore [name], male    DOB: 08-Feb-1946, 70 y.o.   MRN: [id number]  Chief Complaint  Patient presents with  . Follow-up    still having cellulitis on legs, left leg is worse    HPI:  Theodore [name] is a 70 y.o. male who  has a past medical history of CAD (coronary artery disease); Cellulitis (10/18-20/2012); Diabetes mellitus; Diverticulitis (02/19/13); Gallstones; Hyperlipidemia; Hypertension; Myocardial infarction ([place]); Paroxysmal nocturnal dyspnea; Prostate cancer ([place]); and PVD (peripheral vascular disease) ([place]). and presents today for an office follow up.   1.) Cellulitis of legs - Continue to have his legs wrapped and has increased his furosemide to 80 mg which seems to be making an improvement in his symptoms. Working to decrease the amount of sodium in his diet. Reports taking the medication as prescribed and denies adverse side effects. Home Health continues to come once a week to wrap his legs. Does continue to have lesions located on the left leg with no discharge or signs of infection.   2.) Dental pain - This is a new problem. Associated symptom of pain located in his mouth has been going on for a couple of days. His partial located on the bottom aspect that he describes has gradually worked its way through the bottom tooth. There is pain that is modified by Orajel. Does have a follow up with dentistry in about 1 month.     Allergies  Allergen Reactions  . Sulfonamide Derivatives     "reaction" as infant according to his mother      Outpatient Medications Prior to Visit  Medication Sig Dispense Refill  . aspirin 325 MG tablet Take 325 mg by mouth every evening.     . carvedilol (COREG) 6.25 MG tablet Take 1 tablet (6.25 mg total) by mouth 2 (two) times daily with a meal. 60 tablet 1  . furosemide (LASIX) 40 MG tablet Take 1 tablet (40 mg total) by mouth 2 (two) times daily. 60 tablet 1  . Leuprolide Acetate (LUPRON IJ) Inject as directed. 1  injection every 4 months     . losartan (COZAAR) 100 MG tablet Take 50 mg by mouth daily.     . metFORMIN (GLUCOPHAGE) 500 MG tablet Take 500 mg by mouth daily.     . potassium chloride (K-DUR) 10 MEQ tablet Take 1 tablet (10 mEq total) by mouth daily. 90 tablet 0   No facility-administered medications prior to visit.       Past Surgical History:  Procedure Laterality Date  . [place]  . CHOLECYSTECTOMY    . PENILE PROSTHESIS  REMOVAL     swich(sphincter)...removed 04-2008, replaced 02-2009  . PROSTATE SURGERY    . TEE WITHOUT CARDIOVERSION N/A 06/01/2016   Procedure: TRANSESOPHAGEAL ECHOCARDIOGRAM (TEE);  Surgeon: Sanda Klein, MD;  Location: [facility] ENDOSCOPY;  Service: Cardiovascular;  Laterality: N/A;  . TONSILLECTOMY        Past Medical History:  Diagnosis Date  . CAD (coronary artery disease)   . Cellulitis 10/18-20/2012   RLE   . Diabetes mellitus    controlled  . Diverticulitis 02/19/13  . Gallstones   . Hyperlipidemia   . Hypertension   . Myocardial infarction ([facility])    inferior wall  . Paroxysmal nocturnal dyspnea   . Prostate cancer [facility])    prostate  . PVD (peripheral vascular disease) ([place])       Review of Systems  Constitutional: Negative for  chills and fever.  Respiratory: Negative for cough, chest tightness and shortness of breath.   Cardiovascular: Positive for leg swelling. Negative for chest pain and palpitations.  Neurological: Negative for weakness and numbness.      Objective:    BP 100/68 (BP Location: Left Arm, Patient Position: Sitting, Cuff Size: Large)   Pulse 94   Temp 98.2 F (36.8 C) (Oral)   Resp 16   Ht 5\' 5"  (1.651 m)   Wt 235 lb (106.6 kg)   SpO2 93%   BMI 39.11 kg/m  Nursing note and vital signs reviewed.  Physical Exam  Constitutional: He is oriented to person, place, and time. He appears well-developed and well-nourished. No distress.  HENT:  Poor dental hygiene with spacer noted in the front lower  aspect of the mouth. No obvious abscess but there is significant redness.   Cardiovascular: Normal rate, regular rhythm, normal heart sounds and intact distal pulses.   Right lower extremity wrapped. Both extremities appear improved with no evidence of infection currently. Pulses are intact and appropriate.  Pulmonary/Chest: Effort normal and breath sounds normal.  Neurological: He is alert and oriented to person, place, and time.  Skin: Skin is warm and dry.  Psychiatric: He has a normal mood and affect. His behavior is normal. Judgment and thought content normal.       Assessment & Plan:   Problem List Items Addressed This Visit      Digestive   Dental infection - Primary    Dental infection from poor dental hygiene with no evidence of abscess although significant redness. Start Augmentin. Recommend hydrogen peroxide rinses. Follow up with dentistry as able.       Relevant Medications   amoxicillin-clavulanate (AUGMENTIN) 875-125 MG tablet     Other   Cellulitis and abscess of leg    Leg edema is significantly improved with increased furosemide. No evidence of infection noted on exam. Continue to wrap left leg, keep elevated, and reduce sodium in diet.        Other Visit Diagnoses   None.      I am having Mr. Austill start on amoxicillin-clavulanate. I am also having him maintain his aspirin, Leuprolide Acetate (LUPRON IJ), losartan, metFORMIN, potassium chloride, carvedilol, and furosemide.   Meds ordered this encounter  Medications  . amoxicillin-clavulanate (AUGMENTIN) 875-125 MG tablet    Sig: Take 1 tablet by mouth 2 (two) times daily.    Dispense:  20 tablet    Refill:  0    Order Specific Question:   Supervising Provider    Answer:   Pricilla Holm A [id number]     Follow-up: Return if symptoms worsen or fail to improve.  [name], FNP

## 2016-08-11 NOTE — Patient Instructions (Addendum)
Thank you for choosing [facility].  SUMMARY AND INSTRUCTIONS:  Please continue to take the furosemide as prescribed.  Start the antibiotic for your mouth.  Continue to elevate your legs and decrease the sodium in your diet.   Medication:  Your prescription(s) have been submitted to your pharmacy or been printed and provided for you. Please take as directed and contact our office if you believe you are having problem(s) with the medication(s) or have any questions.  Follow up:  If your symptoms worsen or fail to improve, please contact our office for further instruction, or in case of emergency go directly to the emergency room at the closest medical facility.

## 2016-08-15 ENCOUNTER — Encounter: Payer: Self-pay | Admitting: Cardiovascular Disease

## 2016-08-15 ENCOUNTER — Ambulatory Visit (INDEPENDENT_AMBULATORY_CARE_PROVIDER_SITE_OTHER): Payer: Medicare Other | Admitting: Cardiovascular Disease

## 2016-08-15 VITALS — BP 110/62 | HR 74 | Ht 65.0 in | Wt 237.8 lb

## 2016-08-15 DIAGNOSIS — Z7689 Persons encountering health services in other specified circumstances: Secondary | ICD-10-CM | POA: Diagnosis not present

## 2016-08-15 DIAGNOSIS — I5032 Chronic diastolic (congestive) heart failure: Secondary | ICD-10-CM

## 2016-08-15 NOTE — Patient Instructions (Addendum)
Medication Instructions:  Your physician recommends that you continue on your current medications as directed. Please refer to the Current Medication list given to you today.  Labwork: NONE  Testing/Procedures: NONE  Follow-Up: Your physician wants you to follow-up in: 3 months with Dr. Nishan.   If you need a refill on your cardiac medications before your next appointment, please call your pharmacy.    

## 2016-08-22 ENCOUNTER — Telehealth: Payer: Self-pay

## 2016-08-22 NOTE — Telephone Encounter (Signed)
Home Health Cert/Plan of Care received (08/05/2016 - 10/03/2016) and placed on MD's desk for signature

## 2016-08-23 NOTE — Telephone Encounter (Signed)
Paperwork signed, faxed, copy sent to scan 

## 2016-08-30 ENCOUNTER — Telehealth: Payer: Self-pay | Admitting: Internal Medicine

## 2016-08-30 NOTE — Telephone Encounter (Signed)
colestid which Theodore [name] is taking somehow got removed from his medicine list.  He is taking it so I put it back on there so all Drs in epic can see it.

## 2016-08-31 ENCOUNTER — Telehealth: Payer: Self-pay | Admitting: Family

## 2016-08-31 NOTE — Telephone Encounter (Signed)
Requesting to use Unaboot for right leg.

## 2016-09-01 NOTE — Telephone Encounter (Signed)
[place] for [employer].

## 2016-09-01 NOTE — Telephone Encounter (Signed)
Gave verbal ok per Greg. 

## 2016-09-06 DIAGNOSIS — I110 Hypertensive heart disease with heart failure: Secondary | ICD-10-CM | POA: Diagnosis not present

## 2016-09-06 DIAGNOSIS — L97221 Non-pressure chronic ulcer of left calf limited to breakdown of skin: Secondary | ICD-10-CM | POA: Diagnosis not present

## 2016-09-06 DIAGNOSIS — E1151 Type 2 diabetes mellitus with diabetic peripheral angiopathy without gangrene: Secondary | ICD-10-CM | POA: Diagnosis not present

## 2016-09-06 DIAGNOSIS — I5031 Acute diastolic (congestive) heart failure: Secondary | ICD-10-CM | POA: Diagnosis not present

## 2016-09-14 ENCOUNTER — Telehealth: Payer: Self-pay | Admitting: Family

## 2016-09-14 DIAGNOSIS — S81802A Unspecified open wound, left lower leg, initial encounter: Secondary | ICD-10-CM

## 2016-09-14 NOTE — Telephone Encounter (Signed)
[facility] called requesting a referral for the wound center for the places on his legs. Please follow up with Cecille Rubin once referral is done.   [phone]

## 2016-09-15 NOTE — Telephone Encounter (Signed)
Referral for wound care has been placed

## 2016-09-16 NOTE — Telephone Encounter (Signed)
Left msg for Cecille Rubin to call back

## 2016-09-20 NOTE — Telephone Encounter (Signed)
Lori from [facility] and pt are aware of appt with wound center

## 2016-09-20 NOTE — Telephone Encounter (Signed)
Left msg for Cecille Rubin to call back Pt is scheduled at [facility] wound care on 11/21 @ 9:45 with Dr. Con Memos

## 2016-10-04 ENCOUNTER — Encounter (HOSPITAL_BASED_OUTPATIENT_CLINIC_OR_DEPARTMENT_OTHER): Payer: Medicare Other | Attending: Surgery

## 2016-10-04 ENCOUNTER — Telehealth: Payer: Self-pay

## 2016-10-04 MED ORDER — CARVEDILOL 6.25 MG PO TABS: 6.2500 mg | ORAL_TABLET | Freq: Two times a day (BID) | ORAL | 1 refills | Status: DC

## 2016-10-04 MED ORDER — POTASSIUM CHLORIDE ER 10 MEQ PO TBCR: 10.0000 meq | EXTENDED_RELEASE_TABLET | Freq: Every day | ORAL | 1 refills | Status: DC

## 2016-10-04 MED ORDER — FUROSEMIDE 40 MG PO TABS: 40.0000 mg | ORAL_TABLET | Freq: Two times a day (BID) | ORAL | 1 refills | Status: DC

## 2016-10-04 NOTE — Telephone Encounter (Signed)
Pt called requesting a 90 day supply of Potassium, Carvedilol and Furosemide for cost savings to [place]. Rx sent

## 2016-10-20 ENCOUNTER — Encounter: Payer: Self-pay | Admitting: Podiatry

## 2016-10-20 ENCOUNTER — Ambulatory Visit (INDEPENDENT_AMBULATORY_CARE_PROVIDER_SITE_OTHER): Payer: Medicare Other | Admitting: Podiatry

## 2016-10-20 VITALS — Ht 65.0 in | Wt 237.0 lb

## 2016-10-20 DIAGNOSIS — M79676 Pain in unspecified toe(s): Secondary | ICD-10-CM

## 2016-10-20 DIAGNOSIS — L03032 Cellulitis of left toe: Secondary | ICD-10-CM | POA: Diagnosis not present

## 2016-10-20 DIAGNOSIS — E1140 Type 2 diabetes mellitus with diabetic neuropathy, unspecified: Secondary | ICD-10-CM

## 2016-10-20 DIAGNOSIS — B351 Tinea unguium: Secondary | ICD-10-CM

## 2016-10-20 MED ORDER — CEPHALEXIN 500 MG PO CAPS: 500.0000 mg | ORAL_CAPSULE | Freq: Three times a day (TID) | ORAL | 0 refills | Status: DC

## 2016-10-20 NOTE — Progress Notes (Signed)
Patient ID: Theodore [name], male   DOB: Jun 14, 1946, 70 y.o.   MRN: [id number] Complaint:  Visit Type: Patient returns to my office for continued preventative foot care services. Complaint: Patient states" my nails have grown long and thick and become painful to walk and wear shoes" Patient has been diagnosed with DM with no foot complications. The patient presents for preventative foot care services. No changes to ROS.  The patient states that he has been treated at the wound care center with Unna boots on both legs and feet. He states that the [employer] covered his toenails on both feet. He says that about 4 days ago while he was wearing the Unna boot therapy became significant pain and redness in the big toe of the left foot he states that the [employer] was removed yesterday and he has provided no self treatment f or his  condtion Podiatric Exam: Vascular: dorsalis pedis and posterior tibial pulses are not  palpable bilateral. Capillary return is immediate. Temperature gradient is WNL.   Sensorium: Normal Semmes Weinstein monofilament test. Normal tactile sensation bilaterally. Nail Exam: Pt has thick disfigured discolored nails with subungual debris noted bilateral entire nail hallux .  Pus redness and swelling at distal aspect medial border left great toe. Ulcer Exam: There is no evidence of ulcer or pre-ulcerative changes or infection. Orthopedic Exam: Muscle tone and strength are WNL. No limitations in general ROM. No crepitus or effusions noted. Foot type and digits show no abnormalities. Bony prominences are unremarkable. Skin: No Porokeratosis. No infection or ulcers  Diagnosis:  Onychomycosis, , Pain in right toe, pain in left toes,  Paronychia left hallux  Treatment & Plan Procedures and Treatment: Consent by patient was obtained for treatment procedures. The patient understood the discussion of treatment and procedures well. All questions were answered thoroughly reviewed. Debridement of  mycotic and hypertrophic toenails, 1 through 5 bilateral and clearing of subungual debris. No ulceration, no infection noted. Incision and drainage medial border left hallux.  Home instructions.  Prescribed cephalexin 500 mg  # 30  Itid.  RTC 10 weeks for preventive foot care services.  Call as needed. If the toe continues to be infected Return Visit-Office Procedure: Patient instructed to return to the office for a follow up visit 10 weeks  for continued evaluation and treatment.   Gardiner Barefoot DPM

## 2016-11-01 ENCOUNTER — Encounter (HOSPITAL_BASED_OUTPATIENT_CLINIC_OR_DEPARTMENT_OTHER): Payer: Medicare Other | Attending: Surgery

## 2016-11-01 DIAGNOSIS — E11620 Type 2 diabetes mellitus with diabetic dermatitis: Secondary | ICD-10-CM | POA: Diagnosis not present

## 2016-11-01 DIAGNOSIS — Z7984 Long term (current) use of oral hypoglycemic drugs: Secondary | ICD-10-CM | POA: Insufficient documentation

## 2016-11-01 DIAGNOSIS — Z79899 Other long term (current) drug therapy: Secondary | ICD-10-CM | POA: Diagnosis not present

## 2016-11-01 DIAGNOSIS — I5042 Chronic combined systolic (congestive) and diastolic (congestive) heart failure: Secondary | ICD-10-CM | POA: Diagnosis not present

## 2016-11-01 DIAGNOSIS — Z8546 Personal history of malignant neoplasm of prostate: Secondary | ICD-10-CM | POA: Diagnosis not present

## 2016-11-01 DIAGNOSIS — E1151 Type 2 diabetes mellitus with diabetic peripheral angiopathy without gangrene: Secondary | ICD-10-CM | POA: Insufficient documentation

## 2016-11-01 DIAGNOSIS — Z7982 Long term (current) use of aspirin: Secondary | ICD-10-CM | POA: Diagnosis not present

## 2016-11-01 DIAGNOSIS — Z87891 Personal history of nicotine dependence: Secondary | ICD-10-CM | POA: Insufficient documentation

## 2016-11-01 DIAGNOSIS — I2510 Atherosclerotic heart disease of native coronary artery without angina pectoris: Secondary | ICD-10-CM | POA: Diagnosis not present

## 2016-11-01 DIAGNOSIS — I890 Lymphedema, not elsewhere classified: Secondary | ICD-10-CM | POA: Diagnosis not present

## 2016-11-10 ENCOUNTER — Ambulatory Visit: Payer: Medicare Other | Admitting: Family

## 2016-11-11 ENCOUNTER — Ambulatory Visit (INDEPENDENT_AMBULATORY_CARE_PROVIDER_SITE_OTHER): Payer: Medicare Other | Admitting: Family

## 2016-11-11 ENCOUNTER — Encounter: Payer: Self-pay | Admitting: Family

## 2016-11-11 VITALS — BP 136/78 | HR 81 | Temp 97.9°F | Resp 16 | Ht 65.0 in | Wt 243.0 lb

## 2016-11-11 DIAGNOSIS — I10 Essential (primary) hypertension: Secondary | ICD-10-CM | POA: Diagnosis not present

## 2016-11-11 DIAGNOSIS — E1165 Type 2 diabetes mellitus with hyperglycemia: Secondary | ICD-10-CM

## 2016-11-11 NOTE — Patient Instructions (Addendum)
Thank you for choosing [facility].  SUMMARY AND INSTRUCTIONS:  Medication:  Please continue to take your medication as prescribed.   Follow up:  If your symptoms worsen or fail to improve, please contact our office for further instruction, or in case of emergency go directly to the emergency room at the closest medical facility.     

## 2016-11-11 NOTE — Assessment & Plan Note (Signed)
Type 2 diabetes with most recent A1c of 6.5 and appearing well controlled. Diabetic prevention is up-to-date. Maintained on losartan for CAD risk reduction. Continue current dosage of metformin. Encouraged to monitor blood sugars at home. Follow-up in 3 months or sooner if needed.

## 2016-11-11 NOTE — Assessment & Plan Note (Signed)
Blood pressure appears adequately controlled and below goal 140/90 with current regimen and no adverse side effects. No hypotensive readings. Denies worse headache of life with no new symptoms of end organ damage noted on physical exam. Lower extremity edema is significantly improved with current dosage of furosemide. Continue current dosage of carvedilol and losartan. Encouraged to monitor blood pressure at home and follow low-sodium diet. Continue to monitor.

## 2016-11-11 NOTE — Progress Notes (Signed)
Subjective:    Patient ID: Theodore [name], male    DOB: [birth year], 70 y.o.   MRN: [id number]  Chief Complaint  Patient presents with  . Follow-up    HPI:  Theodore [name] is a 70 y.o. male who  has a past medical history of CAD (coronary artery disease); Cellulitis (10/18-20/2012); Diabetes mellitus; Diverticulitis (02/19/13); Gallstones; Hyperlipidemia; Hypertension; Myocardial infarction; Paroxysmal nocturnal dyspnea; Prostate cancer ([place]); and PVD (peripheral vascular disease) ([place]). and presents today for a follow up office visit.   1.) Type 2 diabetes - Currently maintained on metformin. Reports taking the medication as prescribed and denies adverse side effect or hypoglycemic readings. Denies changes in vision or new symptoms of end organ damage. Continues to experience the associated symptom of numbness and tingling especially exacerbated with standing for long periods of time.   Lab Results  Component Value Date   HGBA1C 6.5 04/15/2016    2.) Essential hypertension - Currently maintained on losartan and carvedilol. Does take furosemide for lower extremity edema. Blood pressure has been well controlled at home.Denies headache, worst headache of life, or new symptoms of end organ damage. Continues to work on following a low-sodium diet.  BP Readings from Last 3 Encounters:  11/11/16 136/78  08/15/16 110/62  08/11/16 100/68     Allergies  Allergen Reactions  . Sulfonamide Derivatives     "reaction" as infant according to his mother      Outpatient Medications Prior to Visit  Medication Sig Dispense Refill  . aspirin 325 MG tablet Take 325 mg by mouth every evening.     . carvedilol (COREG) 6.25 MG tablet Take 1 tablet (6.25 mg total) by mouth 2 (two) times daily with a meal. 180 tablet 1  . colestipol (COLESTID) 5 g granules Take 5 g by mouth daily.    . furosemide (LASIX) 40 MG tablet Take 1 tablet (40 mg total) by mouth 2 (two) times daily. 180 tablet 1  .  Leuprolide Acetate (LUPRON IJ) Inject as directed. 1 injection every 4 months     . losartan (COZAAR) 100 MG tablet Take 50 mg by mouth daily.     . metFORMIN (GLUCOPHAGE) 500 MG tablet Take 500 mg by mouth daily.     . potassium chloride (K-DUR) 10 MEQ tablet Take 1 tablet (10 mEq total) by mouth daily. 90 tablet 1  . amoxicillin-clavulanate (AUGMENTIN) 875-125 MG tablet Take 1 tablet by mouth 2 (two) times daily. 20 tablet 0  . cephALEXin (KEFLEX) 500 MG capsule Take 1 capsule (500 mg total) by mouth 3 (three) times daily. 30 capsule 0   No facility-administered medications prior to visit.      Review of Systems  Constitutional: Negative for chills and fever.  Eyes:       Negative for changes in vision  Respiratory: Negative for cough, chest tightness, shortness of breath and wheezing.   Cardiovascular: Negative for chest pain, palpitations and leg swelling.  Endocrine: Negative for polydipsia, polyphagia and polyuria.  Neurological: Positive for numbness. Negative for dizziness, weakness, light-headedness and headaches.      Objective:    BP 136/78 (BP Location: Left Arm, Patient Position: Sitting, Cuff Size: Large)   Pulse 81   Temp 97.9 F (36.6 C) (Oral)   Resp 16   Ht 5\' 5"  (1.651 m)   Wt 243 lb (110.2 kg)   SpO2 92%   BMI 40.44 kg/m  Nursing note and vital signs reviewed.  Physical Exam  Constitutional: He  is oriented to person, place, and time. He appears well-developed and well-nourished. No distress.  Cardiovascular: Normal rate, regular rhythm, normal heart sounds and intact distal pulses.   Pulmonary/Chest: Effort normal and breath sounds normal.  Neurological: He is alert and oriented to person, place, and time.  Skin: Skin is warm and dry.  Psychiatric: He has a normal mood and affect. His behavior is normal. Judgment and thought content normal.       Assessment & Plan:   Problem List Items Addressed This Visit      Cardiovascular and Mediastinum    Essential hypertension - Primary    Blood pressure appears adequately controlled and below goal 140/90 with current regimen and no adverse side effects. No hypotensive readings. Denies worse headache of life with no new symptoms of end organ damage noted on physical exam. Lower extremity edema is significantly improved with current dosage of furosemide. Continue current dosage of carvedilol and losartan. Encouraged to monitor blood pressure at home and follow low-sodium diet. Continue to monitor.        Endocrine   Type 2 diabetes mellitus with hyperglycemia ([place])    Type 2 diabetes with most recent A1c of 6.5 and appearing well controlled. Diabetic prevention is up-to-date. Maintained on losartan for CAD risk reduction. Continue current dosage of metformin. Encouraged to monitor blood sugars at home. Follow-up in 3 months or sooner if needed.         I have discontinued Mr. Hofman's amoxicillin-clavulanate and cephALEXin. I am also having him maintain his aspirin, Leuprolide Acetate (LUPRON IJ), losartan, metFORMIN, colestipol, carvedilol, furosemide, and potassium chloride.   Follow-up: Return in about 3 months (around 02/09/2017), or if symptoms worsen or fail to improve.  [name], FNP

## 2016-11-17 LAB — PSA: PSA: 1.51

## 2016-12-28 NOTE — Progress Notes (Signed)
CARDIOLOGY OFFICE NOTE  Date:  01/05/2017    [name] Date of Birth: Mar 23, 1946 Medical Record [id number]  PCP:  [name], FNP  Cardiologist:  [name]    Chief Complaint  Patient presents with  . Congestive Heart Failure    History of Present Illness: Theodore [name] is a 71 y.o. male who presents today for a f/u visit Seen by PA 07/20/16  He has a history of metastatic prostate cancer, DM, CAD and PVD.   Admitted back in July 2017  with altered mental status and fever. Treated for cellulitis/sepsis. Blood cultures were positive. Cardiology called for CHF and + troponin. TEE unrevealing for endocarditis.   CAD distant Cath 1998 with occluded collateralized RCA no left sided disease   Wife feels like he has basically given up and "has a death wish". Home health comes once a week - no real indication if his legs are getting better - not able to tell me - apparently still having blisters. Loves salt - eating country ham every day along with other highly processed foods.   Past Medical History:  Diagnosis Date  . CAD (coronary artery disease)   . Cellulitis 10/18-20/2012   RLE   . Diabetes mellitus    controlled  . Diverticulitis 02/19/13  . Gallstones   . Hyperlipidemia   . Hypertension   . Myocardial infarction    inferior wall  . Paroxysmal nocturnal dyspnea   . Prostate cancer [facility])    prostate  . PVD (peripheral vascular disease) ([place])     Past Surgical History:  Procedure Laterality Date  . [place]  . CHOLECYSTECTOMY    . PENILE PROSTHESIS  REMOVAL     swich(sphincter)...removed 04-2008, replaced 02-2009  . PROSTATE SURGERY    . TEE WITHOUT CARDIOVERSION N/A 06/01/2016   Procedure: TRANSESOPHAGEAL ECHOCARDIOGRAM (TEE);  Surgeon: Sanda Klein, MD;  Location: [facility] ENDOSCOPY;  Service: Cardiovascular;  Laterality: N/A;  . TONSILLECTOMY       Medications: Current Outpatient Prescriptions  Medication Sig Dispense Refill  .  aspirin 325 MG tablet Take 325 mg by mouth every evening.     . carvedilol (COREG) 6.25 MG tablet Take 1 tablet (6.25 mg total) by mouth 2 (two) times daily with a meal. 180 tablet 1  . colestipol (COLESTID) 5 g granules Take 5 g by mouth daily.    . furosemide (LASIX) 40 MG tablet Take 1 tablet (40 mg total) by mouth 2 (two) times daily. 180 tablet 1  . Leuprolide Acetate (LUPRON IJ) Inject as directed. 1 injection every 4 months     . losartan (COZAAR) 100 MG tablet Take 50 mg by mouth daily.     . metFORMIN (GLUCOPHAGE) 500 MG tablet Take 500 mg by mouth daily.     . potassium chloride (K-DUR) 10 MEQ tablet Take 1 tablet (10 mEq total) by mouth daily. 90 tablet 1   No current facility-administered medications for this visit.     Allergies: Allergies  Allergen Reactions  . Sulfonamide Derivatives     "reaction" as infant according to his mother    Social History: The patient  reports that he quit smoking about 30 years ago. His smoking use included Cigarettes. He has never used smokeless tobacco. He reports that he does not drink alcohol or use drugs.   Family History: The patient's family history includes Alcoholism in his mother; Diabetes in his brother and father; Gout in his brother; Heart disease  in his father; Kidney disease in his brother; Pneumonia in his mother; Seizures in his father; Stroke in his father.   Review of Systems: Please see the history of present illness.   Otherwise, the review of systems is positive for none.   All other systems are reviewed and negative.   Physical Exam: VS:  BP 140/70   Pulse 95   Ht 5\' 5"  (1.651 m)   Wt 241 lb 1.9 oz (109.4 kg)   SpO2 92%   BMI 40.12 kg/m  .  BMI Body mass index is 40.12 kg/m.  Wt Readings from Last 3 Encounters:  01/05/17 241 lb 1.9 oz (109.4 kg)  12/29/16 243 lb (110.2 kg)  11/11/16 243 lb (110.2 kg)   Oxygen sat 91% on RA noted.   General: Chronically ill appearing but alert and in no acute distress.  Color is pale.   HEENT: Normal.  Neck: Thick.  Cardiac: Regular rate and rhythm. Rate is a little fast. Legs are wrapped bilaterally.  Respiratory:  Lungs are fairly clear but with diminished breath sounds due to body habitus.  GI: Obese. Soft and nontender.  MS: No deformity or atrophy. Gait slow. ROM intact.  Skin: Warm and dry. Color is sallow Neuro:  Strength and sensation are intact and no gross focal deficits noted.  Psych: Alert, appropriate and with normal affect. Plus 2 RLE edema and LLE wrapped    LABORATORY DATA:  EKG:  07/20/16  NSR - rate of 98. Inferior Q's noted - unchanged - rate is a little slower than last tracing.   Lab Results  Component Value Date   WBC 9.1 07/20/2016   HGB 12.1 (L) 07/20/2016   HCT 36.0 (L) 07/20/2016   PLT 282 07/20/2016   GLUCOSE 111 (H) 07/20/2016   CHOL 130 05/06/2016   TRIG 111 05/06/2016   HDL 62 05/06/2016   LDLCALC 46 05/06/2016   ALT 16 07/20/2016   AST 15 07/20/2016   NA 142 07/20/2016   K 4.1 07/20/2016   CL 101 07/20/2016   CREATININE 1.42 (H) 07/20/2016   BUN 26 (H) 07/20/2016   CO2 29 07/20/2016   TSH 2.20 07/08/2015   INR 1.38 05/24/2016   HGBA1C 6.5 04/15/2016   MICROALBUR 1.0 07/08/2015    BNP (last 3 results)  Recent Labs  05/24/16 1031 05/25/16 1246 07/20/16 1400  BNP 415.4* 214.3* 65.2    ProBNP (last 3 results) No results for input(s): PROBNP in the last 8760 hours.   Other Studies Reviewed Today:  Transesophageal Echocardiography 06/01/16 Study Conclusions  - Left ventricle: There was mild concentric hypertrophy. Systolic  function was normal. The estimated ejection fraction was in the  range of 55% to 60%. Wall motion was normal; there were no  regional wall motion abnormalities. - Aortic valve: No evidence of vegetation. - Mitral valve: No evidence of vegetation. - Left atrium: The atrium was mildly dilated. No evidence of  thrombus in the atrial cavity or appendage. - Right atrium:  No evidence of thrombus in the atrial cavity or  appendage. - Atrial septum: No defect or patent foramen ovale was identified. - Tricuspid valve: No evidence of vegetation. - Pulmonic valve: No evidence of vegetation.  Impressions:  - No evidence of endocarditis.   Echo Study Conclusions 05/2016  - Left ventricle: The cavity size was normal. There was moderate   focal basal hypertrophy of the septum. Systolic function was   vigorous. The estimated ejection fraction was in the range of  65%   to 70%. Wall motion was normal; there were no regional wall   motion abnormalities. Features are consistent with a pseudonormal   left ventricular filling pattern, with concomitant abnormal   relaxation and increased filling pressure (grade 2 diastolic   dysfunction). Doppler parameters are consistent with elevated   ventricular end-diastolic filling pressure. - Aortic root: The aortic root was normal in size. - Mitral valve: Calcified annulus. Mildly thickened leaflets . - Left atrium: The atrium was mildly dilated. - Right atrium: The atrium was normal in size. - Tricuspid valve: There was mild regurgitation. - Pulmonary arteries: Systolic pressure was within the normal   range. - Inferior vena cava: The vessel was normal in size. - Pericardium, extracardiac: There was no pericardial effusion.  Impressions:  - limited study quality secondary to tachycardia during the   acquisition.  Assessment/Plan:  1. Sepsis: 2/2 to bacteremia. Now off of antibiotics - plan per PCP - getting weekly dressing changes.   2. Bacteremia: 2/2 bilateral LE cellulitis. Blood cultures + for gram positive cocci, streptococcus species. No significant valvular abnormalities nor vegetations noted on 2D echo.  TEE without endocarditis.   3. Acute on chronic Diastolic CHF: 2D Echo this last admit demonstrated vigorous LV systolic function with an EF of 65-70%. Grade 2DD was noted. TEE showed LV EF of  55-60%, no WM abnormality. On Coreg/Losartan/Lasix.    4. Elevated Troponin: 0.11>0.41>>0.45>>0.48>>1.04. Likely secondary to demand ischemia in the setting of sepsis/ bacteremia. 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 He denies any recent CP. Some atypical right sided cramping now resolved. Had normal EF without WM abnormality  Salt restriction is imperative. I do not think he is willing to make changes after listening to his wife.  Lexiscan myovue to risk stratify but likely to be positive in inferior wall due to collaterals   5. Hypokalemia:  Improved  Lab Results  Component Value Date   CREATININE 1.42 (H) 07/20/2016   BUN 26 (H) 07/20/2016   NA 142 07/20/2016   K 4.1 07/20/2016   CL 101 07/20/2016   CO2 29 07/20/2016     F/u with me in 6 months   Jenkins Rouge

## 2016-12-29 ENCOUNTER — Ambulatory Visit (INDEPENDENT_AMBULATORY_CARE_PROVIDER_SITE_OTHER): Payer: Medicare Other | Admitting: Podiatry

## 2016-12-29 ENCOUNTER — Encounter: Payer: Self-pay | Admitting: Podiatry

## 2016-12-29 VITALS — Ht 65.0 in | Wt 243.0 lb

## 2016-12-29 DIAGNOSIS — M79676 Pain in unspecified toe(s): Secondary | ICD-10-CM | POA: Diagnosis not present

## 2016-12-29 DIAGNOSIS — L03032 Cellulitis of left toe: Secondary | ICD-10-CM

## 2016-12-29 DIAGNOSIS — B351 Tinea unguium: Secondary | ICD-10-CM

## 2016-12-29 DIAGNOSIS — E1140 Type 2 diabetes mellitus with diabetic neuropathy, unspecified: Secondary | ICD-10-CM

## 2016-12-29 NOTE — Progress Notes (Signed)
Patient ID: Theodore [name], male   DOB: [birth year], 71 y.o.   MRN: [id number] Complaint:  Visit Type: Patient returns to my office for continued preventative foot care services. Complaint: Patient states" my nails have grown long and thick and become painful to walk and wear shoes" Patient has been diagnosed with DM with no foot complications. The patient presents for preventative foot care services. No changes to ROS  Podiatric Exam: Vascular: dorsalis pedis and posterior tibial pulses are not  palpable bilateral. Capillary return is immediate. Temperature gradient is WNL.   Sensorium: Normal Semmes Weinstein monofilament test. Normal tactile sensation bilaterally. Nail Exam: Pt has thick disfigured discolored nails with subungual debris noted bilateral entire nail hallux  Ulcer Exam: There is no evidence of ulcer or pre-ulcerative changes or infection. Orthopedic Exam: Muscle tone and strength are WNL. No limitations in general ROM. No crepitus or effusions noted. Foot type and digits show no abnormalities. Bony prominences are unremarkable. Skin: No Porokeratosis. No infection or ulcers  Diagnosis:  Onychomycosis, , Pain in right toe, pain in left toes  Treatment & Plan Procedures and Treatment: Consent by patient was obtained for treatment procedures. The patient understood the discussion of treatment and procedures well. All questions were answered thoroughly reviewed. Debridement of mycotic and hypertrophic toenails, 1 through 5 bilateral and clearing of subungual debris. No ulceration, no infection noted.  Return Visit-Office Procedure: Patient instructed to return to the office for a follow up visit 3 months for continued evaluation and treatment.   Gardiner Barefoot DPM

## 2017-01-05 ENCOUNTER — Ambulatory Visit (INDEPENDENT_AMBULATORY_CARE_PROVIDER_SITE_OTHER): Payer: Medicare Other | Admitting: Cardiovascular Disease

## 2017-01-05 ENCOUNTER — Encounter: Payer: Self-pay | Admitting: Cardiovascular Disease

## 2017-01-05 VITALS — BP 140/70 | HR 95 | Ht 65.0 in | Wt 241.1 lb

## 2017-01-05 DIAGNOSIS — I5032 Chronic diastolic (congestive) heart failure: Secondary | ICD-10-CM

## 2017-01-05 NOTE — Patient Instructions (Signed)

## 2017-02-16 ENCOUNTER — Other Ambulatory Visit: Payer: Self-pay | Admitting: Urology

## 2017-02-16 DIAGNOSIS — C7951 Secondary malignant neoplasm of bone: Secondary | ICD-10-CM

## 2017-02-16 DIAGNOSIS — C61 Malignant neoplasm of prostate: Secondary | ICD-10-CM

## 2017-02-16 DIAGNOSIS — Z79899 Other long term (current) drug therapy: Secondary | ICD-10-CM

## 2017-02-16 DIAGNOSIS — C7952 Secondary malignant neoplasm of bone marrow: Principal | ICD-10-CM

## 2017-03-09 ENCOUNTER — Ambulatory Visit (INDEPENDENT_AMBULATORY_CARE_PROVIDER_SITE_OTHER): Payer: Medicare Other | Admitting: Podiatry

## 2017-03-09 DIAGNOSIS — E1140 Type 2 diabetes mellitus with diabetic neuropathy, unspecified: Secondary | ICD-10-CM

## 2017-03-09 DIAGNOSIS — M79676 Pain in unspecified toe(s): Secondary | ICD-10-CM

## 2017-03-09 DIAGNOSIS — B351 Tinea unguium: Secondary | ICD-10-CM | POA: Diagnosis not present

## 2017-03-09 MED ORDER — CEPHALEXIN 500 MG PO CAPS: 500.0000 mg | ORAL_CAPSULE | Freq: Two times a day (BID) | ORAL | 0 refills | Status: DC

## 2017-03-09 NOTE — Progress Notes (Signed)
Patient ID: Theodore [name], male   DOB: 1946-10-05, 71 y.o.   MRN: [id number] Complaint:  Visit Type: Patient returns to my office for continued preventative foot care services. Complaint: Patient states" my nails have grown long and thick and become painful to walk and wear shoes" Patient has been diagnosed with DM with no foot complications. The patient presents for preventative foot care services. No changes to ROS.  Patient also says he had drainage from the tip of left toe.  Podiatric Exam: Vascular: dorsalis pedis and posterior tibial pulses are not  palpable bilateral. Capillary return is immediate. Temperature gradient is WNL.   Sensorium: Normal Semmes Weinstein monofilament test. Normal tactile sensation bilaterally. Nail Exam: Pt has thick disfigured discolored nails with subungual debris noted bilateral entire nail hallux  Ulcer Exam: There is no evidence of ulcer or pre-ulcerative changes or infection. Orthopedic Exam: Muscle tone and strength are WNL. No limitations in general ROM. No crepitus or effusions noted. Foot type and digits show no abnormalities. Bony prominences are unremarkable. Skin: No Porokeratosis. No infection or ulcers.  Small skin breal tip of left hallux.  No drainage or infection  Diagnosis:  Onychomycosis, , Pain in right toe, pain in left toes  Treatment & Plan Procedures and Treatment: Consent by patient was obtained for treatment procedures. The patient understood the discussion of treatment and procedures well. All questions were answered thoroughly reviewed. Debridement of mycotic and hypertrophic toenails, 1 through 5 bilateral and clearing of subungual debris. No ulceration, no infection noted. Prescribe cephalexin  # 15. Return Visit-Office Procedure: Patient instructed to return to the office for a follow up visit 10 weeks for continued evaluation and treatment.   Gardiner Barefoot DPM

## 2017-03-09 NOTE — Addendum Note (Signed)
Addended byDeidre Ala, Camari Wisham L on: 03/09/2017 05:03 PM   Modules accepted: Orders

## 2017-03-20 ENCOUNTER — Encounter (HOSPITAL_COMMUNITY)
Admission: RE | Admit: 2017-03-20 | Discharge: 2017-03-20 | Disposition: A | Discharge status: Home / Self Care | Payer: Medicare Other | Source: Ambulatory Visit | Attending: Urology | Admitting: Urology

## 2017-03-20 ENCOUNTER — Ambulatory Visit
Admission: RE | Admit: 2017-03-20 | Discharge: 2017-03-20 | Disposition: A | Discharge status: Home / Self Care | Payer: Medicare Other | Source: Ambulatory Visit | Attending: Urology | Admitting: Urology

## 2017-03-20 DIAGNOSIS — C7951 Secondary malignant neoplasm of bone: Secondary | ICD-10-CM | POA: Insufficient documentation

## 2017-03-20 DIAGNOSIS — C7952 Secondary malignant neoplasm of bone marrow: Secondary | ICD-10-CM | POA: Diagnosis present

## 2017-03-20 DIAGNOSIS — C61 Malignant neoplasm of prostate: Secondary | ICD-10-CM

## 2017-03-20 DIAGNOSIS — Z79899 Other long term (current) drug therapy: Secondary | ICD-10-CM

## 2017-03-20 MED ORDER — TECHNETIUM TC 99M MEDRONATE IV KIT
25.0000 | PACK | Freq: Once | INTRAVENOUS | Status: AC | PRN
  Administered 2017-03-20: 25 via INTRAVENOUS

## 2017-04-03 ENCOUNTER — Other Ambulatory Visit: Payer: Self-pay | Admitting: Family

## 2017-04-04 ENCOUNTER — Encounter: Payer: Self-pay | Admitting: Oncology

## 2017-04-04 ENCOUNTER — Telehealth: Payer: Self-pay | Admitting: Oncology

## 2017-04-04 NOTE — Telephone Encounter (Signed)
Appt has been scheduled for the pt to see Dr. Alen Blew on 6/13 at 11am. Unable to reach the pt. Lft a vm w/appt date and time. Letter mailed to the pt and faxed to the referring.

## 2017-04-05 ENCOUNTER — Other Ambulatory Visit: Payer: Self-pay | Admitting: Family

## 2017-04-25 ENCOUNTER — Telehealth: Payer: Self-pay | Admitting: *Deleted

## 2017-04-25 NOTE — Telephone Encounter (Signed)
Called patient and reminded him of his New patient appointment tomorrow with Dr. Alen Blew. Patient verbalized understanding.

## 2017-04-26 ENCOUNTER — Ambulatory Visit: Payer: Medicare Other | Admitting: Oncology

## 2017-05-11 ENCOUNTER — Telehealth: Payer: Self-pay | Admitting: Oncology

## 2017-05-11 ENCOUNTER — Ambulatory Visit (HOSPITAL_BASED_OUTPATIENT_CLINIC_OR_DEPARTMENT_OTHER): Payer: Medicare Other | Admitting: Oncology

## 2017-05-11 ENCOUNTER — Encounter: Payer: Self-pay | Admitting: Medical Oncology

## 2017-05-11 VITALS — BP 147/80 | HR 82 | Temp 98.1°F | Resp 18 | Ht 65.0 in | Wt 244.3 lb

## 2017-05-11 DIAGNOSIS — Z8546 Personal history of malignant neoplasm of prostate: Secondary | ICD-10-CM

## 2017-05-11 DIAGNOSIS — C61 Malignant neoplasm of prostate: Secondary | ICD-10-CM

## 2017-05-11 NOTE — Telephone Encounter (Signed)
Scheduled appt per 6/28 los - Gave patient AVS and calender per los.  

## 2017-05-11 NOTE — Progress Notes (Signed)
Introduced myself to Theodore [name] and his wife as the prostate nurse navigator and my role. He states he was diagnosed in 1994 with prostate cancer. He had surgery and Lupron. He has done well but his PSA has been increasing. He feels much better after his consult with Dr. Alen Blew. He thought he was going to hear there is nothing else to do but was very encouraged there is treatment if his PSA continues to increase. He will follow up in 3 months with Dr. Alen Blew. I asked them to call with questions or concerns.

## 2017-05-11 NOTE — Progress Notes (Signed)
Reason for Referral: Prostate cancer.   HPI: Mr. Pardee is a 71 year old gentleman currently of [place] but lived in multiple areas as he certainly the [place] for 8 years. He gets part of his care at the [place] including his prescription medications. He has history of coronary disease, diabetes as well as peripheral vascular disease. His history of prostate cancer dates back to 37. At that time his PSA was 18 and a Gleason score 4+4 = 8. He underwent a radical prostatectomy in April 1994. He had lymph node involvement at that time. He received adjuvant radiation therapy completed in 1994.  His PSA went up to 11.3 in August 2009 and was started on Lupron at that time. He received intermittent androgen deprivation between 2010 and 2012. His PSA was reinstituted periodically between that time and 2015. His PSA was up to 7.06 and August 2016 and at that time he was started on Lupron continuously. His PSA was down to 1.62 in April 2017 and 1.06 in August 2017. In January 2018 his PSA was 1.51 and in May 2018 was 1.72 testosterone level of 42. Staging workup including a CT scan of the abdomen and pelvis which showed no evidence of disease. His bone scan obtained on 03/20/2017 showed 2 new foci of uptake in the calvarium.  Clinically he is asymptomatic at this time. He denied any pathological fractures or bone pain. He does report lower back pain which has not changed dramatically over the years. He does ambulate with the help of a cane and denied any falls or syncope. He denied any weight loss or appetite changes. His limited his mobility because of his obesity and arthritis distal able to perform most activities of daily living.  He denied any headaches, blurry vision, syncope or seizures. He does not report any fevers or chills or sweats. He does not report any cough, wheezing or hemoptysis. He is not reporting nausea, vomiting or abdominal pain. He does not report any frequency urgency or hesitancy. He  does not report any skeletal complaints. He is not reporting positive myalgias. May review of systems unremarkable.   Past Medical History:  Diagnosis Date  . CAD (coronary artery disease)   . Cellulitis 10/18-20/2012   RLE   . Diabetes mellitus    controlled  . Diverticulitis 02/19/13  . Gallstones   . Hyperlipidemia   . Hypertension   . Myocardial infarction    inferior wall  . Paroxysmal nocturnal dyspnea   . Prostate cancer [facility])    prostate  . PVD (peripheral vascular disease) ([place])   :  Past Surgical History:  Procedure Laterality Date  . [place]  . CHOLECYSTECTOMY    . PENILE PROSTHESIS  REMOVAL     swich(sphincter)...removed 04-2008, replaced 02-2009  . PROSTATE SURGERY    . TEE WITHOUT CARDIOVERSION N/A 06/01/2016   Procedure: TRANSESOPHAGEAL ECHOCARDIOGRAM (TEE);  Surgeon: Sanda Klein, MD;  Location: [facility] ENDOSCOPY;  Service: Cardiovascular;  Laterality: N/A;  . TONSILLECTOMY    :   Current Outpatient Prescriptions:  .  aspirin 325 MG tablet, Take 325 mg by mouth every evening. , Disp: , Rfl:  .  carvedilol (COREG) 6.25 MG tablet, take 1 tablet by mouth twice a day WITH A MEAL, Disp: 180 tablet, Rfl: 1 .  cephALEXin (KEFLEX) 500 MG capsule, Take 1 capsule (500 mg total) by mouth 2 (two) times daily., Disp: 15 capsule, Rfl: 0 .  colestipol (COLESTID) 5 g granules, Take 5 g by mouth daily.,  Disp: , Rfl:  .  furosemide (LASIX) 40 MG tablet, Take 1 tablet (40 mg total) by mouth 2 (two) times daily. F/u appt is due must make appt for future refills, Disp: 60 tablet, Rfl: 0 .  Leuprolide Acetate (LUPRON IJ), Inject as directed. 1 injection every 4 months , Disp: , Rfl:  .  losartan (COZAAR) 100 MG tablet, Take 50 mg by mouth daily. , Disp: , Rfl:  .  metFORMIN (GLUCOPHAGE) 500 MG tablet, Take 500 mg by mouth daily. , Disp: , Rfl:  .  potassium chloride (K-DUR) 10 MEQ tablet, Take 1 tablet (10 mEq total) by mouth daily. F/u appt is due must make appt for  future refills, Disp: 30 tablet, Rfl: 0:  Allergies  Allergen Reactions  . Sulfonamide Derivatives     "reaction" as infant according to his mother  :  Family History  Problem Relation Age of Onset  . Pneumonia Mother   . Alcoholism Mother   . Seizures Father   . Stroke Father   . Diabetes Father   . Heart disease Father   . Gout Brother   . Kidney disease Brother        kidney failure   . Diabetes Brother   :  Social History   Social History  . Marital status: Married    Spouse name: N/A  . Number of children: 2  . Years of education: N/A   Occupational History  . retired    Social History Main Topics  . Smoking status: Former Smoker    Types: Cigarettes    Quit date: 05/17/1986  . Smokeless tobacco: Never Used  . Alcohol use No  . Drug use: No  . Sexual activity: Not on file   Other Topics Concern  . Not on file   Social History Narrative   Married and retired. He is a English as a second language teacher. He gets some care from the [place]. 2 sons. 2 caffeinated drinks a day.  :  Pertinent items are noted in HPI.  Exam: Blood pressure (!) 147/80, pulse 82, temperature 98.1 F (36.7 C), temperature source Oral, resp. rate 18, height 5\' 5"  (1.651 m), weight 244 lb 4.8 oz (110.8 kg).  ECOG 1 General appearance: alert and cooperative appeared without distress. Throat: Oral ulcers or thrush. Neck: no adenopathy Back: negative Resp: clear to auscultation bilaterally without rhonchi or wheezes or dullness to percussion. Chest wall: no tenderness on palpation. Heart examination: Regular rate and rhythm. No murmurs or gallops. Abdomen: Soft, obese with good bowel sounds. No rebound or guarding. Extremities: extremities normal, atraumatic, no cyanosis or edema Skin: Skin color, texture, turgor normal. No rashes or lesions  CBC    Component Value Date/Time   WBC 9.1 07/20/2016 1400   RBC 3.86 (L) 07/20/2016 1400   HGB 12.1 (L) 07/20/2016 1400   HCT 36.0 (L) 07/20/2016 1400   PLT 282  07/20/2016 1400   MCV 93.3 07/20/2016 1400   MCH 31.3 07/20/2016 1400   MCHC 33.6 07/20/2016 1400   RDW 14.8 07/20/2016 1400   LYMPHSABS 1.1 05/28/2016 0328   MONOABS 0.7 05/28/2016 0328   EOSABS 0.1 05/28/2016 0328   BASOSABS 0.0 05/28/2016 0328   CMP Latest Ref Rng & Units 07/20/2016 07/11/2016 06/02/2016  Glucose 65 - 99 mg/dL 111(H) 102(H) 122(H)  BUN 7 - 25 mg/dL 26(H) 20 23(H)  Creatinine 0.70 - 1.18 mg/dL 1.42(H) 1.02 0.90  Sodium 135 - 146 mmol/L 142 140 135  Potassium 3.5 - 5.3 mmol/L 4.1  4.0 4.0  Chloride 98 - 110 mmol/L 101 101 91(L)  CO2 20 - 31 mmol/L 29 33(H) 35(H)  Calcium 8.6 - 10.3 mg/dL 9.1 9.1 9.0  Total Protein 6.1 - 8.1 g/dL 6.6 - -  Total Bilirubin 0.2 - 1.2 mg/dL 0.4 - -  Alkaline Phos 40 - 115 U/L 65 - -  AST 10 - 35 U/L 15 - -  ALT 9 - 46 U/L 16 - -     Assessment and Plan:   71 year old gentleman with the following issues:  1. Prostate cancer diagnosed in 1994. His Gleason score is 4+4 = 8 and a PSA of 18. He underwent radical prostatectomy which showed a lymph node involvement. He did receive adjuvant radiation therapy. He developed recurrent disease and has been on Lupron intermittently since 2009. His PSA was 7.06 in August 2016 and has been on Lupron continuously since that time. PSA in May 2018 was 1.72 after a rise from 1.51 in January 2018. Bone scan in May 2018 showed 2 foci of uptake consistent with possible metastatic disease in the calvarium.  The natural course of advanced prostate cancer was discussed today with the patient and his family. He appears to have biochemical recurrence and possibly measurable disease that is asymptomatic and rather low volume. 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  Options of therapy were reviewed today with the patient in detail. These options would include second line hormonal therapy in the form of bicalutamide, Zytiga, or Xtandi. I see little role  for systemic chemotherapy or Xofigo. Given the slow rise in his PSA and his history of diabetes, Gillermina Phy would be a reasonable option. Risks and benefits associated with this medication were reviewed today. Complications include fatigue, edema, hypertension and rarely hematuria and seizures.  The plan is to repeat his PSA in September 2018 which already scheduled with Dr. Diona Fanti. If his PSA continues to rise at that time we'll consider starting Xtandi. His PSA Clines remains stable, we can defer that option to a later date. He understands any treatment at this time is palliative and not curative moving forward.  2. Androgen depravation: This will continue indefinitely.  3. Bone directed therapy: He is a candidate for Xgeva once he completes dental clearance. He will likely require teeth pulling before attempting this therapy.  4. Follow-up: Will be in September 2018 to discuss the next steps.

## 2017-05-23 ENCOUNTER — Ambulatory Visit: Payer: Medicare Other | Admitting: Podiatry

## 2017-06-07 IMAGING — NM NM BONE WHOLE BODY
2 series · 2 of 2 positions shown · non-contrast
Comparison: 05/04/2015

Radiographic correlation:  None recent

CLINICAL DATA: Prostate cancer with bone metastasis

EXAM:
NUCLEAR MEDICINE WHOLE BODY BONE SCAN
TECHNIQUE: Whole body anterior and posterior images were obtained approximately
3 hours after intravenous injection of radiopharmaceutical.
RADIOPHARMACEUTICALS:  21.5 mCi 3echnetium-55m MDP IV

[Series 1: wbr_bone_40 whole body · 2.66mm/px · 1 of 1 slices shown (1 of 2)]
[im 1/1]
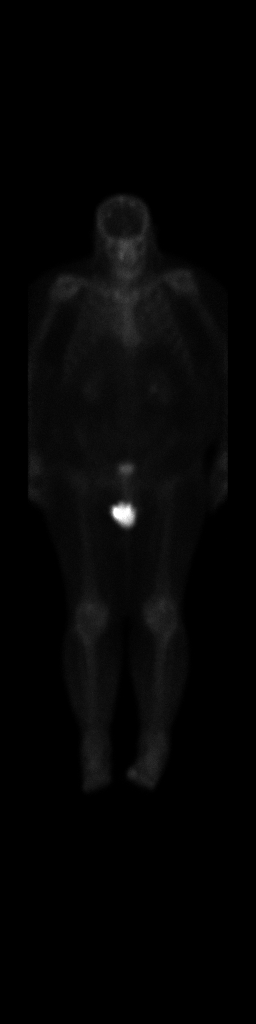

[Series 1: wbr_bone_40 whole body · 2.66mm/px · 1 of 1 slices shown (2 of 2)]
[im 1/1]
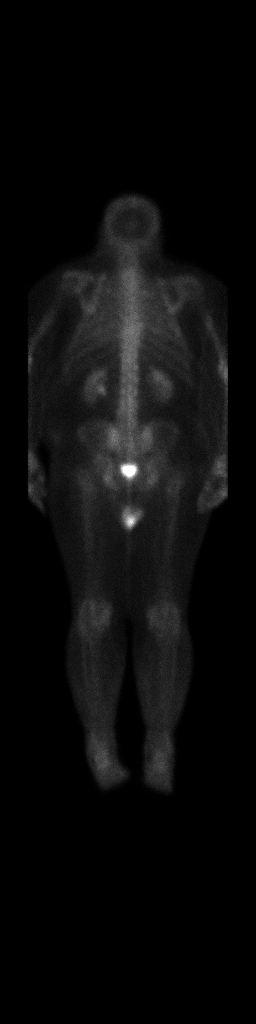

[2 of 2 positions shown; findings below may reference images not displayed]

FINDINGS: Foci of abnormal increased tracer localization are seen at the
calvaria bilaterally question osseous metastases, new since previous
exam.

Uptake at the knees, feet, hands/wrists and sternoclavicular joints
typically degenerative.

No definite additional sites of abnormal osseous tracer accumulation
are identified.

Expected urinary tract and soft tissue distribution of tracer.
IMPRESSION: New foci of increased tracer localization at the calvaria
bilaterally question osseous metastases.

## 2017-06-15 ENCOUNTER — Ambulatory Visit (INDEPENDENT_AMBULATORY_CARE_PROVIDER_SITE_OTHER): Payer: Medicare Other | Admitting: Podiatry

## 2017-06-15 DIAGNOSIS — B351 Tinea unguium: Secondary | ICD-10-CM | POA: Diagnosis not present

## 2017-06-15 DIAGNOSIS — M79676 Pain in unspecified toe(s): Secondary | ICD-10-CM | POA: Diagnosis not present

## 2017-06-15 DIAGNOSIS — E1140 Type 2 diabetes mellitus with diabetic neuropathy, unspecified: Secondary | ICD-10-CM

## 2017-06-15 NOTE — Progress Notes (Signed)
Patient ID: Theodore [name], male   DOB: Oct 19, 1946, 71 y.o.   MRN: [id number] Complaint:  Visit Type: Patient returns to my office for continued preventative foot care services. Complaint: Patient states" my nails have grown long and thick and become painful to walk and wear shoes" Patient has been diagnosed with DM with no foot complications. The patient presents for preventative foot care services. No changes to ROS.  Patient also says he had drainage from the tip of left toe.  Podiatric Exam: Vascular: dorsalis pedis and posterior tibial pulses are not  palpable bilateral. Capillary return is immediate. Temperature gradient is WNL.   Sensorium: Normal Semmes Weinstein monofilament test. Normal tactile sensation bilaterally. Nail Exam: Pt has thick disfigured discolored nails with subungual debris noted bilateral entire nail hallux through 5th toes  B/L Ulcer Exam: There is no evidence of ulcer or pre-ulcerative changes or infection. Orthopedic Exam: Muscle tone and strength are WNL. No limitations in general ROM. No crepitus or effusions noted. Foot type and digits show no abnormalities. Bony prominences are unremarkable. Skin: No Porokeratosis. No infection or ulcers.   No drainage or infection  Diagnosis:  Onychomycosis, , Pain in right toe, pain in left toes  Treatment & Plan Procedures and Treatment: Consent by patient was obtained for treatment procedures. The patient understood the discussion of treatment and procedures well. All questions were answered thoroughly reviewed. Debridement of mycotic and hypertrophic toenails, 1 through 5 bilateral and clearing of subungual debris. No ulceration, no infection noted.  Return Visit-Office Procedure: Patient instructed to return to the office for a follow up visit 10 weeks for continued evaluation and treatment.   Gardiner Barefoot DPM

## 2017-08-11 ENCOUNTER — Ambulatory Visit: Payer: Medicare Other | Admitting: Oncology

## 2017-08-15 ENCOUNTER — Ambulatory Visit: Payer: Medicare Other | Admitting: Oncology

## 2017-08-15 ENCOUNTER — Telehealth: Payer: Self-pay

## 2017-08-15 ENCOUNTER — Telehealth: Payer: Self-pay | Admitting: *Deleted

## 2017-08-15 NOTE — Telephone Encounter (Signed)
Received voice mail message stating,"patient took his shower this morning, and now he is to tired to come and see Dr. Alen Blew. Please cancel his appointment.

## 2017-08-15 NOTE — Telephone Encounter (Signed)
Wife called to say patient was sick and had to cancel appointments. Per 10/2 phone que

## 2017-09-13 ENCOUNTER — Ambulatory Visit: Payer: Medicare Other | Admitting: Podiatry

## 2018-02-26 ENCOUNTER — Telehealth: Payer: Self-pay | Admitting: Oncology

## 2018-02-26 ENCOUNTER — Telehealth: Payer: Self-pay | Admitting: Family

## 2018-02-26 NOTE — Telephone Encounter (Signed)
Yes, I will see him.

## 2018-02-26 NOTE — Telephone Encounter (Signed)
Copied from [place] [id number]. Topic: Appointment Scheduling - Scheduling Inquiry for Clinic >> Feb 26, 2018 12:33 PM Lennox Solders wrote: Reason for CRM: pt was seeing greg calone. Pt would like to est with dr Scarlette Calico   ** Please advise if you are ok with taking the patient on as a transfer. Thank you.

## 2018-02-26 NOTE — Telephone Encounter (Signed)
Pt has been scheduled.  °

## 2018-02-26 NOTE — Telephone Encounter (Signed)
Pt called in to reschedule missed appt - rescheduled for 5/10.

## 2018-02-26 NOTE — Telephone Encounter (Signed)
Called and LVM to inform patient Dr. Ronnald Ramp will accept him as a transfer.   When patient calls back please set him up an appointment with Dr.Jones.   OV is the type - In the notes *TRANSFER [facility]ARE [facility]*  [place].

## 2018-02-28 ENCOUNTER — Inpatient Hospital Stay (HOSPITAL_COMMUNITY)
Admission: AD | Admit: 2018-02-28 | Discharge: 2018-03-02 | DRG: 700 | Disposition: A | Discharge status: Home / Self Care | Payer: Medicare Other | Source: Ambulatory Visit | Attending: Urology | Admitting: Urology

## 2018-02-28 ENCOUNTER — Other Ambulatory Visit: Payer: Self-pay | Admitting: Urology

## 2018-02-28 ENCOUNTER — Other Ambulatory Visit: Payer: Self-pay

## 2018-02-28 ENCOUNTER — Encounter (HOSPITAL_COMMUNITY): Payer: Self-pay

## 2018-02-28 DIAGNOSIS — Y732 Prosthetic and other implants, materials and accessory gastroenterology and urology devices associated with adverse incidents: Secondary | ICD-10-CM | POA: Diagnosis not present

## 2018-02-28 DIAGNOSIS — C61 Malignant neoplasm of prostate: Secondary | ICD-10-CM | POA: Diagnosis present

## 2018-02-28 DIAGNOSIS — Z9119 Patient's noncompliance with other medical treatment and regimen: Secondary | ICD-10-CM

## 2018-02-28 DIAGNOSIS — Z882 Allergy status to sulfonamides status: Secondary | ICD-10-CM

## 2018-02-28 DIAGNOSIS — I10 Essential (primary) hypertension: Secondary | ICD-10-CM | POA: Diagnosis present

## 2018-02-28 DIAGNOSIS — Z87891 Personal history of nicotine dependence: Secondary | ICD-10-CM | POA: Diagnosis not present

## 2018-02-28 DIAGNOSIS — T83591A Infection and inflammatory reaction due to implanted urinary sphincter, initial encounter: Principal | ICD-10-CM | POA: Diagnosis present

## 2018-02-28 DIAGNOSIS — Z833 Family history of diabetes mellitus: Secondary | ICD-10-CM | POA: Diagnosis not present

## 2018-02-28 DIAGNOSIS — Z8249 Family history of ischemic heart disease and other diseases of the circulatory system: Secondary | ICD-10-CM

## 2018-02-28 DIAGNOSIS — E1151 Type 2 diabetes mellitus with diabetic peripheral angiopathy without gangrene: Secondary | ICD-10-CM | POA: Diagnosis present

## 2018-02-28 DIAGNOSIS — I2510 Atherosclerotic heart disease of native coronary artery without angina pectoris: Secondary | ICD-10-CM | POA: Diagnosis present

## 2018-02-28 DIAGNOSIS — I252 Old myocardial infarction: Secondary | ICD-10-CM

## 2018-02-28 LAB — CBC WITH DIFFERENTIAL/PLATELET
BASOS PCT: 1 %
Basophils Absolute: 0.1 10*3/uL (ref 0.0–0.1)
EOS ABS: 0.1 10*3/uL (ref 0.0–0.7)
EOS PCT: 1 %
HCT: 34.1 % — ABNORMAL LOW (ref 39.0–52.0)
Hemoglobin: 11.3 g/dL — ABNORMAL LOW (ref 13.0–17.0)
Lymphocytes Relative: 17 %
Lymphs Abs: 2.1 10*3/uL (ref 0.7–4.0)
MCH: 31 pg (ref 26.0–34.0)
MCHC: 33.1 g/dL (ref 30.0–36.0)
MCV: 93.4 fL (ref 78.0–100.0)
Monocytes Absolute: 0.7 10*3/uL (ref 0.1–1.0)
Monocytes Relative: 6 %
Neutro Abs: 9.5 10*3/uL — ABNORMAL HIGH (ref 1.7–7.7)
Neutrophils Relative %: 75 %
PLATELETS: 416 10*3/uL — AB (ref 150–400)
RBC: 3.65 MIL/uL — ABNORMAL LOW (ref 4.22–5.81)
RDW: 14.8 % (ref 11.5–15.5)
WBC: 12.5 10*3/uL — ABNORMAL HIGH (ref 4.0–10.5)

## 2018-02-28 LAB — COMPREHENSIVE METABOLIC PANEL
ALK PHOS: 68 U/L (ref 38–126)
ALT: 17 U/L (ref 17–63)
AST: 19 U/L (ref 15–41)
Albumin: 2.9 g/dL — ABNORMAL LOW (ref 3.5–5.0)
Anion gap: 11 (ref 5–15)
BUN: 33 mg/dL — AB (ref 6–20)
CALCIUM: 8.9 mg/dL (ref 8.9–10.3)
CO2: 25 mmol/L (ref 22–32)
CREATININE: 1.74 mg/dL — AB (ref 0.61–1.24)
Chloride: 101 mmol/L (ref 101–111)
GFR, EST AFRICAN AMERICAN: 43 mL/min — AB (ref >60)
GFR, EST NON AFRICAN AMERICAN: 37 mL/min — AB (ref >60)
Glucose, Bld: 178 mg/dL — ABNORMAL HIGH (ref 65–99)
Potassium: 4.1 mmol/L (ref 3.5–5.1)
SODIUM: 137 mmol/L (ref 135–145)
Total Bilirubin: 0.4 mg/dL (ref 0.3–1.2)
Total Protein: 7.1 g/dL (ref 6.5–8.1)

## 2018-02-28 LAB — GLUCOSE, CAPILLARY
GLUCOSE-CAPILLARY: 96 mg/dL (ref 65–99)
Glucose-Capillary: 155 mg/dL — ABNORMAL HIGH (ref 65–99)

## 2018-02-28 LAB — PSA: PROSTATIC SPECIFIC ANTIGEN: 3.2 ng/mL (ref 0.00–4.00)

## 2018-02-28 MED ORDER — HYDROCODONE-ACETAMINOPHEN 5-325 MG PO TABS
1.0000 | ORAL_TABLET | ORAL | Status: DC | PRN
  Administered 2018-02-28: 2 via ORAL
  Filled 2018-02-28 (×2): qty 2

## 2018-02-28 MED ORDER — SENNA 8.6 MG PO TABS
1.0000 | ORAL_TABLET | Freq: Two times a day (BID) | ORAL | Status: DC
  Administered 2018-03-02: 8.6 mg via ORAL
  Filled 2018-02-28 (×4): qty 1

## 2018-02-28 MED ORDER — SODIUM CHLORIDE 0.45 % IV SOLN
INTRAVENOUS | Status: DC
  Administered 2018-02-28 – 2018-03-02 (×3): via INTRAVENOUS

## 2018-02-28 MED ORDER — ACETAMINOPHEN 325 MG PO TABS
650.0000 mg | ORAL_TABLET | ORAL | Status: DC | PRN
  Administered 2018-03-01 – 2018-03-02 (×3): 650 mg via ORAL
  Filled 2018-02-28 (×3): qty 2

## 2018-02-28 MED ORDER — ONDANSETRON HCL 4 MG/2ML IJ SOLN: 4.0000 mg | INTRAMUSCULAR | Status: DC | PRN

## 2018-02-28 MED ORDER — PIPERACILLIN-TAZOBACTAM 3.375 G IVPB
3.3750 g | Freq: Three times a day (TID) | INTRAVENOUS | Status: DC
  Administered 2018-02-28 – 2018-03-02 (×6): 3.375 g via INTRAVENOUS
  Filled 2018-02-28 (×6): qty 50

## 2018-02-28 NOTE — H&P (Signed)
H&P  Chief Complaint: fever, scrotal/perineal pain  History of Present Illness: Theodore [name] presented to my office today with one-week history of fever, scrotal/perineal pain.Theodore [name] He has metastatic castrate resistant prostate cancer.  He is fairly noncompliant recently in follow-up.  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  There is significant left scrotal tenderness as well as tenderness along the tubing in his perineum.  Because of significant fever as well as the patient's poor medical condition, he is admitted for antibiotic management. Past Medical History:  Diagnosis Date  . CAD (coronary artery disease)   . Cellulitis 10/18-20/2012   RLE   . Diabetes mellitus    controlled  . Diverticulitis 02/19/13  . Gallstones   . Hyperlipidemia   . Hypertension   . Myocardial infarction ([facility])    inferior wall  . Paroxysmal nocturnal dyspnea   . Prostate cancer [facility])    prostate  . PVD (peripheral vascular disease) ([place])     Past Surgical History:  Procedure Laterality Date  . [place]  . CHOLECYSTECTOMY    . PENILE PROSTHESIS  REMOVAL     swich(sphincter)...removed 04-2008, replaced 02-2009  . PROSTATE SURGERY    . TEE WITHOUT CARDIOVERSION N/A 06/01/2016   Procedure: TRANSESOPHAGEAL ECHOCARDIOGRAM (TEE);  Surgeon: Sanda Klein, MD;  Location: [facility] ENDOSCOPY;  Service: Cardiovascular;  Laterality: N/A;  . TONSILLECTOMY      Home Medications:    Allergies:  Allergies  Allergen Reactions  . Sulfonamide Derivatives     "reaction" as infant according to his mother    Family History  Problem Relation Age of Onset  . Pneumonia Mother   . Alcoholism Mother   . Seizures Father   . Stroke Father   . Diabetes Father   . Heart disease Father   . Gout Brother   . Kidney disease Brother        kidney failure   . Diabetes Brother     Social History:  reports that he quit smoking about 31 years ago. His smoking  use included cigarettes. He has never used smokeless tobacco. He reports that he does not drink alcohol or use drugs.  ROS: A complete review of systems was performed.  All systems are negative except for pertinent findings as noted.  Physical Exam:  Vital signs in last 24 hours: Temp:  [97.7 F (36.5 C)] 97.7 F (36.5 C) (04/17 1438) Pulse Rate:  [98] 98 (04/17 1438) Resp:  [19] 19 (04/17 1438) BP: (113)/(63) 113/63 (04/17 1438) SpO2:  [92 %] 92 % (04/17 1438) Weight:  [101.9 kg (224 lb 9.6 oz)] 101.9 kg (224 lb 9.6 oz) (04/17 1438) Constitutional:  Alert and oriented, moderate distress Cardiovascular: Regular rate and rhythm, significant bilateral lower extremity edema Respiratory: Normal respiratory effort GI: Abdomen is soft, with some right lower quadrant tenderness. Genitourinary: Phallus is circumcised.  There is no penile tenderness or erythema/induration.  There is left scrotal erythema with tenderness around his artificial urinary sphincter pump.  Additionally, there is perineal tenderness. Rectal: Not examined Lymphatic: No lymphadenopathy Neurologic: Grossly intact, no focal deficits Psychiatric: Normal mood and affect  Laboratory Data:  Recent Labs    02/28/18 1505  WBC 12.5*  HGB 11.3*  HCT 34.1*  PLT 416*    Recent Labs    02/28/18 1505  NA 137  K 4.1  CL 101  GLUCOSE 178*  BUN 33*  CALCIUM 8.9  CREATININE 1.Theodore*  Results for orders placed or performed during the hospital encounter of 02/28/18 (from the past 24 hour(s))  Comprehensive metabolic panel     Status: Abnormal   Collection Time: 02/28/18  3:05 PM  Result Value Ref Range   Sodium 137 135 - 145 mmol/L   Potassium 4.1 3.5 - 5.1 mmol/L   Chloride 101 101 - 111 mmol/L   CO2 25 22 - 32 mmol/L   Glucose, Bld 178 (H) 65 - 99 mg/dL   BUN 33 (H) 6 - 20 mg/dL   Creatinine, Ser 1.Theodore (H) 0.61 - 1.24 mg/dL   Calcium 8.9 8.9 - 10.3 mg/dL   Total Protein 7.1 6.5 - 8.1 g/dL   Albumin 2.9 (L) 3.5  - 5.0 g/dL   AST 19 15 - 41 U/L   ALT 17 17 - 63 U/L   Alkaline Phosphatase 68 38 - 126 U/L   Total Bilirubin 0.4 0.3 - 1.2 mg/dL   GFR calc non Af Amer 37 (L) >60 mL/min   GFR calc Af Amer 43 (L) >60 mL/min   Anion gap 11 5 - 15  CBC WITH DIFFERENTIAL     Status: Abnormal   Collection Time: 02/28/18  3:05 PM  Result Value Ref Range   WBC 12.5 (H) 4.0 - 10.5 K/uL   RBC 3.65 (L) 4.22 - 5.81 MIL/uL   Hemoglobin 11.3 (L) 13.0 - 17.0 g/dL   HCT 34.1 (L) 39.0 - 52.0 %   MCV 93.4 78.0 - 100.0 fL   MCH 31.0 26.0 - 34.0 pg   MCHC 33.1 30.0 - 36.0 g/dL   RDW 14.8 11.5 - 15.5 %   Platelets 416 (H) 150 - 400 K/uL   Neutrophils Relative % 75 %   Neutro Abs 9.5 (H) 1.7 - 7.7 K/uL   Lymphocytes Relative 17 %   Lymphs Abs 2.1 0.7 - 4.0 K/uL   Monocytes Relative 6 %   Monocytes Absolute 0.7 0.1 - 1.0 K/uL   Eosinophils Relative 1 %   Eosinophils Absolute 0.1 0.0 - 0.7 K/uL   Basophils Relative 1 %   Basophils Absolute 0.1 0.0 - 0.1 K/uL   No results found for this or any previous visit (from the past 240 hour(s)).  Renal Function: Recent Labs    02/28/18 1505  CREATININE 1.Theodore*   Estimated Creatinine Clearance: 41.6 mL/min (A) (by C-G formula based on SCr of 1.Theodore mg/dL (H)).  Radiologic Imaging: No results found.  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  I will admit for IV antibiotics.  CT scan was performed in the office  2.  Urine has been sent for culture in our office  3.   I will ask the hospital service to help with his medical management as he has significant comorbidities  4.  He will eventually require cystoscopy to evaluate his urethra..  Unless his condition deteriorates, we will hold off on eventual explant

## 2018-02-28 NOTE — Progress Notes (Deleted)
CARDIOLOGY OFFICE NOTE  Date:  02/28/2018    Theodore [name] Date of Birth: July 09, 1946 Medical Record #[id number]  PCP:  Theodore Lima, MD  Cardiologist:  Theodore [name]    No chief complaint on file.   History of Present Illness:  72 y.o. history of metastatic prostate CA, DM, PVD and CAD. Had mildly elevated troponin July 2017 when admitted with sepsis. TEE with normal EF and no SBE. Had cath back in 1998 showed CTO RCA with collaterals has chronic LE edema with blistering Eats way to much salt and country ham Daily.  Home health visits weekly   ***  Past Medical History:  Diagnosis Date  . CAD (coronary artery disease)   . Cellulitis 10/18-20/2012   RLE   . Diabetes mellitus    controlled  . Diverticulitis 02/19/13  . Gallstones   . Hyperlipidemia   . Hypertension   . Myocardial infarction (HCC)    inferior wall  . Paroxysmal nocturnal dyspnea   . Prostate cancer [facility])    prostate  . PVD (peripheral vascular disease) ([place])     Past Surgical History:  Procedure Laterality Date  . [place]  . CHOLECYSTECTOMY    . PENILE PROSTHESIS  REMOVAL     swich(sphincter)...removed 04-2008, replaced 02-2009  . PROSTATE SURGERY    . TEE WITHOUT CARDIOVERSION N/A 06/01/2016   Procedure: TRANSESOPHAGEAL ECHOCARDIOGRAM (TEE);  Surgeon: Sanda Klein, MD;  Location: [facility] ENDOSCOPY;  Service: Cardiovascular;  Laterality: N/A;  . TONSILLECTOMY       Medications: No current facility-administered medications for this visit.    No current outpatient medications on file.   Facility-Administered Medications Ordered in Other Visits  Medication Dose Route Frequency Provider Last Rate Last Dose  . 0.45 % sodium chloride infusion   Intravenous Continuous Franchot Gallo, MD 50 mL/hr at 02/28/18 1519    . acetaminophen (TYLENOL) tablet 650 mg  650 mg Oral Q4H PRN Franchot Gallo, MD      . HYDROcodone-acetaminophen (NORCO/VICODIN) 5-325 MG per tablet 1-2 tablet   1-2 tablet Oral Q4H PRN Franchot Gallo, MD      . ondansetron [facility]) injection 4 mg  4 mg Intravenous Q4H PRN Franchot Gallo, MD      . piperacillin-tazobactam (ZOSYN) IVPB 3.375 g  3.375 g Intravenous Lebron Quam, MD 12.5 mL/hr at 02/28/18 1556 3.375 g at 02/28/18 1556  . senna (SENOKOT) tablet 8.6 mg  1 tablet Oral BID Franchot Gallo, MD        Allergies: Allergies  Allergen Reactions  . Sulfonamide Derivatives     "reaction" as infant according to his mother    Social History: The patient  reports that he quit smoking about 31 years ago. His smoking use included cigarettes. He has never used smokeless tobacco. He reports that he does not drink alcohol or use drugs.   Family History: The patient's family history includes Alcoholism in his mother; Diabetes in his brother and father; Gout in his brother; Heart disease in his father; Kidney disease in his brother; Pneumonia in his mother; Seizures in his father; Stroke in his father.   Review of Systems: Please see the history of present illness.   Otherwise, the review of systems is positive for none.   All other systems are reviewed and negative.   Physical Exam: VS:  There were no vitals taken for this visit. Marland Kitchen  BMI There is no height or weight on file to calculate BMI.  Wt Readings from Last 3 Encounters:  02/28/18 224 lb 9.6 oz (101.9 kg)  05/11/17 244 lb 4.8 oz (110.8 kg)  01/05/17 241 lb 1.9 oz (109.4 kg)   Oxygen sat 91% on RA noted.   Affect appropriate Chronically ill male  HEENT: normal Neck supple with no adenopathy JVP normal no bruits no thyromegaly Lungs clear with no wheezing and good diaphragmatic motion Heart:  S1/S2 no murmur, no rub, gallop or click PMI normal Abdomen: benighn, BS positve, no tenderness, no AAA no bruit.  No HSM or HJR Distal pulses intact with no bruits Plus 2 edema worse on right  Neuro non-focal Skin warm and dry No muscular weakness    LABORATORY  DATA:  EKG:  07/20/16  NSR - rate of 98. Inferior Q's noted - unchanged - rate is a little slower than last tracing.   Lab Results  Component Value Date   WBC 12.5 (H) 02/28/2018   HGB 11.3 (L) 02/28/2018   HCT 34.1 (L) 02/28/2018   PLT 416 (H) 02/28/2018   GLUCOSE 178 (H) 02/28/2018   CHOL 130 05/06/2016   TRIG 111 05/06/2016   HDL 62 05/06/2016   LDLCALC 46 05/06/2016   ALT 17 02/28/2018   AST 19 02/28/2018   NA 137 02/28/2018   K 4.1 02/28/2018   CL 101 02/28/2018   CREATININE 1.74 (H) 02/28/2018   BUN 33 (H) 02/28/2018   CO2 25 02/28/2018   TSH 2.20 07/08/2015   INR 1.38 05/24/2016   HGBA1C 6.5 04/15/2016   MICROALBUR 1.0 07/08/2015    BNP (last 3 results) No results for input(s): BNP in the last 8760 hours.  ProBNP (last 3 results) No results for input(s): PROBNP in the last 8760 hours.   Other Studies Reviewed Today:  Transesophageal Echocardiography 06/01/16 Study Conclusions  - Left ventricle: There was mild concentric hypertrophy. Systolic  function was normal. The estimated ejection fraction was in the  range of 55% to 60%. Wall motion was normal; there were no  regional wall motion abnormalities. - Aortic valve: No evidence of vegetation. - Mitral valve: No evidence of vegetation. - Left atrium: The atrium was mildly dilated. No evidence of  thrombus in the atrial cavity or appendage. - Right atrium: No evidence of thrombus in the atrial cavity or  appendage. - Atrial septum: No defect or patent foramen ovale was identified. - Tricuspid valve: No evidence of vegetation. - Pulmonic valve: No evidence of vegetation.  Impressions:  - No evidence of endocarditis.   Echo Study Conclusions 05/2016  - Left ventricle: The cavity size was normal. There was moderate   focal basal hypertrophy of the septum. Systolic function was   vigorous. The estimated ejection fraction was in the range of 65%   to 70%. Wall motion was normal; there were no  regional wall   motion abnormalities. Features are consistent with a pseudonormal   left ventricular filling pattern, with concomitant abnormal   relaxation and increased filling pressure (grade 2 diastolic   dysfunction). Doppler parameters are consistent with elevated   ventricular end-diastolic filling pressure. - Aortic root: The aortic root was normal in size. - Mitral valve: Calcified annulus. Mildly thickened leaflets . - Left atrium: The atrium was mildly dilated. - Right atrium: The atrium was normal in size. - Tricuspid valve: There was mild regurgitation. - Pulmonary arteries: Systolic pressure was within the normal   range. - Inferior vena cava: The vessel was normal in size. - Pericardium, extracardiac: There was  no pericardial effusion.  Impressions:  - limited study quality secondary to tachycardia during the   acquisition.  Assessment/Plan:  1. Acute on chronic Diastolic CHF: 2D Echo this last admit demonstrated vigorous LV systolic function with an EF of 65-70%. Grade 2DD was noted. TEE showed LV EF of 55-60%, no WM abnormality. Continue diuretic   2. Elevated Troponin: 0.11>0.41>>0.45>>0.48>>1.04. Likely secondary to demand ischemia in the setting of sepsis/ bacteremia. no chest pain myovue not likely helpful given collaterals known to exist to RCA ***  3. DM:  Discussed low carb diet.  Target hemoglobin A1c is 6.5 or less.  Continue current medications   4. HTN:  Well controlled.  Continue current medications and low sodium Dash type diet.    Jenkins Rouge

## 2018-03-01 ENCOUNTER — Encounter (HOSPITAL_COMMUNITY): Payer: Self-pay

## 2018-03-01 LAB — GLUCOSE, CAPILLARY: GLUCOSE-CAPILLARY: 102 mg/dL — AB (ref 65–99)

## 2018-03-01 MED ORDER — CARVEDILOL 3.125 MG PO TABS
3.1250 mg | ORAL_TABLET | Freq: Two times a day (BID) | ORAL | Status: DC
  Administered 2018-03-01 – 2018-03-02 (×3): 3.125 mg via ORAL
  Filled 2018-03-01 (×3): qty 1

## 2018-03-01 MED ORDER — METFORMIN HCL 500 MG PO TABS
500.0000 mg | ORAL_TABLET | Freq: Every day | ORAL | Status: DC
  Administered 2018-03-01 – 2018-03-02 (×2): 500 mg via ORAL
  Filled 2018-03-01 (×2): qty 1

## 2018-03-01 MED ORDER — COLESTIPOL HCL 5 G PO PACK
5.0000 g | PACK | Freq: Every day | ORAL | Status: DC
  Administered 2018-03-01: 5 g via ORAL
  Filled 2018-03-01 (×3): qty 1

## 2018-03-01 MED ORDER — LOSARTAN POTASSIUM 50 MG PO TABS
50.0000 mg | ORAL_TABLET | Freq: Every day | ORAL | Status: DC
  Administered 2018-03-01 – 2018-03-02 (×2): 50 mg via ORAL
  Filled 2018-03-01 (×2): qty 1

## 2018-03-01 MED ORDER — POTASSIUM CHLORIDE ER 10 MEQ PO TBCR
10.0000 meq | EXTENDED_RELEASE_TABLET | Freq: Every day | ORAL | Status: DC
  Administered 2018-03-01 – 2018-03-02 (×2): 10 meq via ORAL
  Filled 2018-03-01 (×4): qty 1

## 2018-03-01 MED ORDER — HYDROCODONE-ACETAMINOPHEN 5-325 MG PO TABS: 1.0000 | ORAL_TABLET | Freq: Four times a day (QID) | ORAL | 0 refills | Status: DC | PRN

## 2018-03-01 MED ORDER — FUROSEMIDE 40 MG PO TABS
40.0000 mg | ORAL_TABLET | Freq: Two times a day (BID) | ORAL | Status: DC
Start: 2018-03-01 — End: 2018-03-02
  Administered 2018-03-01 – 2018-03-02 (×3): 40 mg via ORAL
  Filled 2018-03-01 (×3): qty 1

## 2018-03-01 MED ORDER — CEPHALEXIN 500 MG PO CAPS: 500.0000 mg | ORAL_CAPSULE | Freq: Three times a day (TID) | ORAL | 0 refills | Status: DC

## 2018-03-01 NOTE — Discharge Instructions (Signed)
Take cephalexin on a regular basis as prescribed  Report recurrent fever or increasing pain to Dr. Alan Ripper office  It is okay to sit in a hot tub or on a heating pad for extra comfort to your scrotal area

## 2018-03-01 NOTE — Progress Notes (Signed)
Subjective: Patient reports not as much pain.  No febrile episodes since admission  Objective: Vital signs in last 24 hours: Temp:  [97.7 F (36.5 C)-99.9 F (37.7 C)] 97.8 F (36.6 C) (04/18 0544) Pulse Rate:  [87-100] 87 (04/18 0544) Resp:  [16-20] 16 (04/18 0544) BP: (113-132)/(63-71) 126/68 (04/18 0544) SpO2:  [92 %-96 %] 96 % (04/18 0544) Weight:  [101.9 kg (224 lb 9.6 oz)] 101.9 kg (224 lb 9.6 oz) (04/17 1438)  Intake/Output from previous day: 04/17 0701 - 04/18 0700 In: 1074.2 [P.O.:240; I.V.:734.2; IV Piggyback:100] Out: -  Intake/Output this shift: No intake/output data recorded.  Physical Exam:  Constitutional: Vital signs reviewed. WD WN in NAD   Eyes: PERRL, No scleral icterus.   Cardiovascular: RRR Pulmonary/Chest: Normal effort Abdominal: Soft. Non-tender, non-distended, bowel sounds are normal, no masses, organomegaly, or guarding present.  Genitourinary: Last scrotal edema/erythema/induration/tenderness.  Still some swelling around the pump in the left scrotum.  No subcutaneous emphysema Extremities: No cyanosis or edema   Lab Results: Recent Labs    02/28/18 1505  HGB 11.3*  HCT 34.1*   BMET Recent Labs    02/28/18 1505  NA 137  K 4.1  CL 101  CO2 25  GLUCOSE 178*  BUN 33*  CREATININE 1.74*  CALCIUM 8.9   No results for input(s): LABPT, INR in the last 72 hours. No results for input(s): LABURIN in the last 72 hours. Results for orders placed or performed during the hospital encounter of 05/24/16  Blood Culture (routine x 2)     Status: Abnormal   Collection Time: 05/24/16 10:31 AM  Result Value Ref Range Status   Specimen Description BLOOD LEFT ANTECUBITAL  Final   Special Requests BOTTLES DRAWN AEROBIC AND ANAEROBIC 5ML  Final   Culture  Setup Time   Final    GRAM POSITIVE COCCI IN CLUSTERS IN PAIRS IN CHAINS IN BOTH AEROBIC AND ANAEROBIC BOTTLES CRITICAL RESULT CALLED TO, READ BACK BY AND VERIFIED WITH: B GREEN PHARMD 2335 05/24/16 A  BROWNING    Culture (A)  Final    GROUP B STREP(S.AGALACTIAE)ISOLATED SUSCEPTIBILITIES PERFORMED ON PREVIOUS CULTURE WITHIN THE LAST 5 DAYS. Performed at [facility]    Report Status 05/27/2016 FINAL  Final  Blood Culture (routine x 2)     Status: Abnormal   Collection Time: 05/24/16 10:42 AM  Result Value Ref Range Status   Specimen Description BLOOD LEFT WRIST  Final   Special Requests BOTTLES DRAWN AEROBIC AND ANAEROBIC 5ML  Final   Culture  Setup Time   Final    GRAM POSITIVE COCCI IN CLUSTERS IN PAIRS IN CHAINS IN BOTH AEROBIC AND ANAEROBIC BOTTLES CRITICAL RESULT CALLED TO, READ BACK BY AND VERIFIED WITH: B GREEN PHARMD 2335 05/24/16 A BROWNING Performed at [place] (A)  Final   Report Status 05/27/2016 FINAL  Final   Organism ID, Bacteria STREPTOCOCCUS AGALACTIAE  Final      Susceptibility   Streptococcus agalactiae - MIC*    CLINDAMYCIN >=1 RESISTANT Resistant     AMPICILLIN <=0.25 SENSITIVE Sensitive     ERYTHROMYCIN >=8 RESISTANT Resistant     VANCOMYCIN 0.5 SENSITIVE Sensitive     CEFTRIAXONE <=0.12 SENSITIVE Sensitive     LEVOFLOXACIN 1 SENSITIVE Sensitive     * STREPTOCOCCUS AGALACTIAE  Blood Culture ID Panel (Reflexed)     Status: Abnormal   Collection Time: 05/24/16 10:42 AM  Result Value Ref Range Status   Enterococcus species  NOT DETECTED NOT DETECTED Final   Vancomycin resistance NOT DETECTED NOT DETECTED Final   Listeria monocytogenes NOT DETECTED NOT DETECTED Final   Staphylococcus species NOT DETECTED NOT DETECTED Final   Staphylococcus aureus NOT DETECTED NOT DETECTED Final   Methicillin resistance NOT DETECTED NOT DETECTED Final   Streptococcus species DETECTED (A) NOT DETECTED Final    Comment: CRITICAL RESULT CALLED TO, READ BACK BY AND VERIFIED WITH: B GREEN PHARMD 2335 05/24/16 A BROWNING    Streptococcus agalactiae DETECTED (A) NOT DETECTED Final    Comment: CRITICAL RESULT CALLED TO, READ  BACK BY AND VERIFIED WITH: B GREEN PHARMD 2335 05/24/16 A BROWNING    Streptococcus pneumoniae NOT DETECTED NOT DETECTED Final   Streptococcus pyogenes NOT DETECTED NOT DETECTED Final   Acinetobacter baumannii NOT DETECTED NOT DETECTED Final   Enterobacteriaceae species NOT DETECTED NOT DETECTED Final   Enterobacter cloacae complex NOT DETECTED NOT DETECTED Final   Escherichia coli NOT DETECTED NOT DETECTED Final   Klebsiella oxytoca NOT DETECTED NOT DETECTED Final   Klebsiella pneumoniae NOT DETECTED NOT DETECTED Final   Proteus species NOT DETECTED NOT DETECTED Final   Serratia marcescens NOT DETECTED NOT DETECTED Final   Carbapenem resistance NOT DETECTED NOT DETECTED Final   Haemophilus influenzae NOT DETECTED NOT DETECTED Final   Neisseria meningitidis NOT DETECTED NOT DETECTED Final   Pseudomonas aeruginosa NOT DETECTED NOT DETECTED Final   Candida albicans NOT DETECTED NOT DETECTED Final   Candida glabrata NOT DETECTED NOT DETECTED Final   Candida krusei NOT DETECTED NOT DETECTED Final   Candida parapsilosis NOT DETECTED NOT DETECTED Final   Candida tropicalis NOT DETECTED NOT DETECTED Final    Comment: Performed at [facility]  Urine culture     Status: Abnormal   Collection Time: 05/24/16 12:25 PM  Result Value Ref Range Status   Specimen Description URINE, CATHETERIZED  Final   Special Requests NONE  Final   Culture (A)  Final    <10,000 COLONIES/mL INSIGNIFICANT GROWTH Performed at [facility]    Report Status 05/25/2016 FINAL  Final  MRSA PCR Screening     Status: None   Collection Time: 05/24/16  2:10 PM  Result Value Ref Range Status   MRSA by PCR NEGATIVE NEGATIVE Final    Comment:        The GeneXpert MRSA Assay (FDA approved for NASAL specimens only), is one component of a comprehensive MRSA colonization surveillance program. It is not intended to diagnose MRSA infection nor to guide or monitor treatment for MRSA infections.   Culture,  blood (Routine X 2) w Reflex to ID Panel     Status: None   Collection Time: 05/25/16 12:46 PM  Result Value Ref Range Status   Specimen Description BLOOD RIGHT HAND  Final   Special Requests BOTTLES DRAWN AEROBIC AND ANAEROBIC 5CC  Final   Culture   Final    NO GROWTH 5 DAYS Performed at [facility]    Report Status 05/30/2016 FINAL  Final  Culture, blood (Routine X 2) w Reflex to ID Panel     Status: None   Collection Time: 05/25/16 12:46 PM  Result Value Ref Range Status   Specimen Description BLOOD LEFT HAND  Final   Special Requests BOTTLES DRAWN AEROBIC AND ANAEROBIC 5CC  Final   Culture   Final    NO GROWTH 5 DAYS Performed at [facility]    Report Status 05/30/2016 FINAL  Final  C difficile quick  scan w PCR reflex     Status: None   Collection Time: 05/31/16 11:40 AM  Result Value Ref Range Status   C Diff antigen NEGATIVE NEGATIVE Final   C Diff toxin NEGATIVE NEGATIVE Final   C Diff interpretation No C. difficile detected.  Final    Studies/Results: No results found.  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  He seems to be better, defervesced sitting and having much less tenderness/induration on exam    I think he can go home tomorrow on p.o. antibiotic.  I will follow him up in 1 week with cystoscopic exam in the office.   LOS: 1 day   Theodore [name] 03/01/2018, 11:55 AM

## 2018-03-02 ENCOUNTER — Ambulatory Visit: Payer: Medicare Other | Admitting: Cardiovascular Disease

## 2018-03-02 LAB — GLUCOSE, CAPILLARY: GLUCOSE-CAPILLARY: 115 mg/dL — AB (ref 65–99)

## 2018-03-12 ENCOUNTER — Other Ambulatory Visit: Payer: Self-pay | Admitting: Urology

## 2018-03-12 DIAGNOSIS — T83191A Other mechanical complication of urinary sphincter implant, initial encounter: Secondary | ICD-10-CM

## 2018-03-13 ENCOUNTER — Ambulatory Visit: Payer: Medicare Other | Admitting: Internal Medicine

## 2018-03-13 ENCOUNTER — Other Ambulatory Visit (INDEPENDENT_AMBULATORY_CARE_PROVIDER_SITE_OTHER): Payer: Medicare Other

## 2018-03-13 ENCOUNTER — Encounter: Payer: Self-pay | Admitting: Internal Medicine

## 2018-03-13 ENCOUNTER — Telehealth: Payer: Self-pay | Admitting: Cardiovascular Disease

## 2018-03-13 VITALS — BP 134/78 | HR 88 | Temp 98.3°F | Resp 16 | Ht 65.0 in | Wt 238.5 lb

## 2018-03-13 DIAGNOSIS — I10 Essential (primary) hypertension: Secondary | ICD-10-CM

## 2018-03-13 DIAGNOSIS — E785 Hyperlipidemia, unspecified: Secondary | ICD-10-CM | POA: Diagnosis not present

## 2018-03-13 DIAGNOSIS — I2510 Atherosclerotic heart disease of native coronary artery without angina pectoris: Secondary | ICD-10-CM | POA: Diagnosis not present

## 2018-03-13 DIAGNOSIS — E118 Type 2 diabetes mellitus with unspecified complications: Secondary | ICD-10-CM

## 2018-03-13 DIAGNOSIS — C61 Malignant neoplasm of prostate: Secondary | ICD-10-CM

## 2018-03-13 DIAGNOSIS — E1121 Type 2 diabetes mellitus with diabetic nephropathy: Secondary | ICD-10-CM

## 2018-03-13 DIAGNOSIS — C7951 Secondary malignant neoplasm of bone: Secondary | ICD-10-CM | POA: Diagnosis not present

## 2018-03-13 DIAGNOSIS — Z794 Long term (current) use of insulin: Secondary | ICD-10-CM

## 2018-03-13 DIAGNOSIS — D539 Nutritional anemia, unspecified: Secondary | ICD-10-CM

## 2018-03-13 DIAGNOSIS — E781 Pure hyperglyceridemia: Secondary | ICD-10-CM

## 2018-03-13 LAB — CBC WITH DIFFERENTIAL/PLATELET
BASOS PCT: 0.5 % (ref 0.0–3.0)
Basophils Absolute: 0 10*3/uL (ref 0.0–0.1)
EOS ABS: 0.3 10*3/uL (ref 0.0–0.7)
Eosinophils Relative: 4.3 % (ref 0.0–5.0)
HCT: 34.5 % — ABNORMAL LOW (ref 39.0–52.0)
Hemoglobin: 11.7 g/dL — ABNORMAL LOW (ref 13.0–17.0)
Lymphocytes Relative: 33.6 % (ref 12.0–46.0)
Lymphs Abs: 2.6 10*3/uL (ref 0.7–4.0)
MCHC: 33.9 g/dL (ref 30.0–36.0)
MCV: 93.9 fl (ref 78.0–100.0)
MONO ABS: 0.8 10*3/uL (ref 0.1–1.0)
Monocytes Relative: 9.8 % (ref 3.0–12.0)
NEUTROS ABS: 4 10*3/uL (ref 1.4–7.7)
Neutrophils Relative %: 51.8 % (ref 43.0–77.0)
Platelets: 353 10*3/uL (ref 150.0–400.0)
RBC: 3.68 Mil/uL — ABNORMAL LOW (ref 4.22–5.81)
RDW: 16.3 % — ABNORMAL HIGH (ref 11.5–15.5)
WBC: 7.6 10*3/uL (ref 4.0–10.5)

## 2018-03-13 LAB — BASIC METABOLIC PANEL
BUN: 23 mg/dL (ref 6–23)
CALCIUM: 9.1 mg/dL (ref 8.4–10.5)
CHLORIDE: 103 meq/L (ref 96–112)
CO2: 31 mEq/L (ref 19–32)
CREATININE: 1.26 mg/dL (ref 0.40–1.50)
GFR: 59.51 mL/min — ABNORMAL LOW (ref >60.00)
GLUCOSE: 101 mg/dL — AB (ref 70–99)
Potassium: 4.3 mEq/L (ref 3.5–5.1)
Sodium: 142 mEq/L (ref 135–145)

## 2018-03-13 LAB — RETICULOCYTES
ABS Retic: 62390 cells/uL (ref 25000–9000)
Retic Ct Pct: 1.7 %

## 2018-03-13 LAB — POCT GLUCOSE (DEVICE FOR HOME USE): Glucose Fasting, POC: 136 mg/dL — AB (ref 70–99)

## 2018-03-13 LAB — LIPID PANEL
CHOL/HDL RATIO: 6
CHOLESTEROL: 195 mg/dL (ref 0–200)
HDL: 31.9 mg/dL — ABNORMAL LOW (ref >39.00)

## 2018-03-13 LAB — LDL CHOLESTEROL, DIRECT: Direct LDL: 102 mg/dL

## 2018-03-13 LAB — FOLATE: Folate: 23.4 ng/mL (ref >5.9)

## 2018-03-13 LAB — POCT GLYCOSYLATED HEMOGLOBIN (HGB A1C): HEMOGLOBIN A1C: 6.7

## 2018-03-13 LAB — IBC PANEL
IRON: 66 ug/dL (ref 42–165)
Saturation Ratios: 20.1 % (ref 20.0–50.0)
Transferrin: 235 mg/dL (ref 212.0–360.0)

## 2018-03-13 LAB — FERRITIN: Ferritin: 95 ng/mL (ref 22.0–322.0)

## 2018-03-13 MED ORDER — METFORMIN HCL 500 MG PO TABS: 500.0000 mg | ORAL_TABLET | Freq: Two times a day (BID) | ORAL | 1 refills | Status: AC

## 2018-03-13 NOTE — Patient Instructions (Signed)

## 2018-03-13 NOTE — Telephone Encounter (Signed)
   Primary Cardiologist:Peter [name], MD  Chart reviewed as part of pre-operative protocol coverage. Because of Theodore [name]'s past medical history and time since last visit, he/she will require a follow-up visit in order to better assess preoperative cardiovascular risk.  Pre-op covering staff: - Please schedule appointment and call patient to inform them. - Please contact requesting surgeon's office via preferred method (i.e, phone, fax) to inform them of need for appointment prior to surgery.  Please comment on holding ASA 325 mg for procedure during preop clearance visit.  Callback pool: please route message to the provider that will see this patient for clearance.   [place], PA  03/13/2018, 2:05 PM

## 2018-03-13 NOTE — Telephone Encounter (Signed)
Routed surgery info to Cecilie Kicks, NP for appt.

## 2018-03-13 NOTE — Telephone Encounter (Signed)
New Message       [facility] Pre-operative Risk Assessment    Request for surgical clearance:  1. What type of surgery is being performed? Explant of artificial urinary sphincter   2. When is this surgery scheduled? TBD    3. What type of clearance is required (medical clearance vs. Pharmacy clearance to hold med vs. Both)? Both  4. Are there any medications that need to be held prior to surgery and how long? Ecotrin (aspirin) 325 needs to know how long   5. Practice name and name of physician performing surgery? [facility]  Dr. Deretha Emory  6. What is your office phone number [phone]   7.   What is your office fax number [phone]  8.   Anesthesia type (None, local, MAC, general) ? General    Theodore [name] 03/13/2018, 12:40 PM  _________________________________________________________________   (provider comments below)

## 2018-03-13 NOTE — Progress Notes (Signed)
Subjective:  Patient ID: Theodore [name], male    DOB: Sep 09, 1946  Age: 72 y.o. MRN: [id number]  CC: Anemia; Diabetes; Hyperlipidemia; and Hypertension  NEW TO ME  HPI Theodore [name] presents for f/up - He is undergoing treatment of metastatic prostate cancer and was recently admitted for IV antibiotics to treat an infected urethral prosthesis.  He tells me that this is getting much better and he is taking the Keflex as indicated.  He tells me he thinks his blood sugars have been controlled recently.  He is not the least bit interested in improving his lifestyle modifications.  He tells me his PSA was about 3.0 done 1 week ago by his urologist.  He does not monitor his blood sugars but denies polys.  Outpatient Medications Prior to Visit  Medication Sig Dispense Refill  . carvedilol (COREG) 6.25 MG tablet take 1 tablet by mouth twice a day WITH A MEAL 180 tablet 1  . cephALEXin (KEFLEX) 500 MG capsule Take 1 capsule (500 mg total) by mouth 3 (three) times daily. 30 capsule 0  . Multiple Vitamins-Minerals (CENTRUM ADULTS PO) Take 1 tablet by mouth daily.    . potassium chloride (K-DUR) 10 MEQ tablet Take 1 tablet (10 mEq total) by mouth daily. F/u appt is due must make appt for future refills 30 tablet 0  . colestipol (COLESTID) 5 g granules Take 5 g by mouth daily.    . furosemide (LASIX) 40 MG tablet Take 1 tablet (40 mg total) by mouth 2 (two) times daily. F/u appt is due must make appt for future refills 60 tablet 0  . HYDROcodone-acetaminophen (NORCO) 5-325 MG tablet Take 1 tablet by mouth every 6 (six) hours as needed for moderate pain. 20 tablet 0  . losartan (COZAAR) 100 MG tablet Take 50 mg by mouth daily.     . metFORMIN (GLUCOPHAGE) 500 MG tablet Take 500 mg by mouth daily.     Marland Kitchen Leuprolide Acetate, 6 Month, (LUPRON DEPOT, 73-MONTH, IM) Inject into the muscle.     No facility-administered medications prior to visit.     ROS Review of Systems  Constitutional: Positive for  fatigue. Negative for diaphoresis and unexpected weight change.  HENT: Negative.   Eyes: Negative.   Respiratory: Negative.  Negative for cough, chest tightness, shortness of breath and wheezing.   Cardiovascular: Negative.  Negative for chest pain, palpitations and leg swelling.  Gastrointestinal: Negative.  Negative for abdominal pain, constipation, diarrhea, nausea and vomiting.  Endocrine: Negative.  Negative for polydipsia, polyphagia and polyuria.  Genitourinary: Negative.  Negative for difficulty urinating, dysuria, frequency and urgency.  Musculoskeletal: Negative.  Negative for myalgias.  Skin: Negative.  Negative for color change.  Allergic/Immunologic: Negative.   Neurological: Negative.  Negative for dizziness, weakness and light-headedness.  Hematological: Negative for adenopathy. Does not bruise/bleed easily.  Psychiatric/Behavioral: Negative.  Negative for dysphoric mood, sleep disturbance and suicidal ideas. The patient is not nervous/anxious.     Objective:  BP 134/78 (BP Location: Left Arm, Patient Position: Sitting, Cuff Size: Large)   Pulse 88   Temp 98.3 F (36.8 C) (Oral)   Resp 16   Ht 5\' 5"  (1.651 m)   Wt 238 lb 8 oz (108.2 kg)   SpO2 94%   BMI 39.69 kg/m   BP Readings from Last 3 Encounters:  03/13/18 134/78  03/02/18 127/65  05/11/17 (!) 147/80    Wt Readings from Last 3 Encounters:  03/13/18 238 lb 8 oz (108.2 kg)  02/28/18 224 lb 9.6 oz (101.9 kg)  05/11/17 244 lb 4.8 oz (110.8 kg)    Physical Exam  Constitutional: He is oriented to person, place, and time. No distress.  HENT:  Mouth/Throat: Oropharynx is clear and moist. No oropharyngeal exudate.  Eyes: Conjunctivae are normal. No scleral icterus.  Neck: Normal range of motion. Neck supple. No JVD present. No thyromegaly present.  Cardiovascular: Normal rate, regular rhythm and normal heart sounds. Exam reveals no gallop and no friction rub.  No murmur heard. Pulmonary/Chest: Effort normal  and breath sounds normal. No stridor. No respiratory distress. He has no wheezes. He has no rales.  Abdominal: Soft. Bowel sounds are normal. He exhibits no mass. There is no hepatosplenomegaly. There is no tenderness.  Musculoskeletal: Normal range of motion. He exhibits no edema, tenderness or deformity.  Lymphadenopathy:    He has no cervical adenopathy.  Neurological: He is alert and oriented to person, place, and time.  Skin: Skin is warm and dry. He is not diaphoretic.  Psychiatric: He has a normal mood and affect. His behavior is normal. Judgment and thought content normal.  Vitals reviewed.   Lab Results  Component Value Date   WBC 7.6 03/13/2018   HGB 11.7 (L) 03/13/2018   HCT 34.5 (L) 03/13/2018   PLT 353.0 03/13/2018   GLUCOSE 101 (H) 03/13/2018   CHOL 195 03/13/2018   TRIG (H) 03/13/2018    485.0 Triglyceride is over 400; calculations on Lipids are invalid.   HDL 31.90 (L) 03/13/2018   LDLDIRECT 102.0 03/13/2018   LDLCALC 46 05/06/2016   ALT 17 02/28/2018   AST 19 02/28/2018   NA 142 03/13/2018   K 4.3 03/13/2018   CL 103 03/13/2018   CREATININE 1.26 03/13/2018   BUN 23 03/13/2018   CO2 31 03/13/2018   TSH 2.02 03/13/2018   PSA 1.51 11/17/2016   INR 1.38 05/24/2016   HGBA1C 6.7 03/13/2018   MICROALBUR 1.0 07/08/2015    No results found.  Assessment & Plan:   Theodore [name] was seen today for anemia, diabetes, hyperlipidemia and hypertension.  Diagnoses and all orders for this visit:  Essential hypertension-he has not quite achieved his blood pressure goal of less than 130/80.  Of asked him to restart the ARB. -     Basic metabolic panel; Future -     Thyroid Panel With TSH; Future  Dyslipidemia, goal LDL below 70-he has not achieved his LDL goal.  Have asked him to start taking a statin for CV risk reduction. -     IBC panel; Future -     Lipid panel; Future -     Thyroid Panel With TSH; Future -     atorvastatin (LIPITOR) 20 MG tablet; Take 1 tablet (20 mg  total) by mouth daily.  Type 2 diabetes mellitus with complication, with long-term current use of insulin (HCC)-his A1c is at 6.7%.  His blood sugars are adequately well controlled.  We will continue the current dose of metformin. -     POCT glycosylated hemoglobin (Hb A1C) -     POCT Glucose (Device for Home Use) -     metFORMIN (GLUCOPHAGE) 500 MG tablet; Take 1 tablet (500 mg total) by mouth 2 (two) times daily with a meal. -     losartan (COZAAR) 100 MG tablet; Take 0.5 tablets (50 mg total) by mouth daily.  Deficiency anemia-his H&H are stable.  Thus far his vitamin levels are normal.  This appears to be consistent with the  anemia of chronic disease. -     CBC with Differential/Platelet; Future -     Ferritin; Future -     Folate; Future -     Vitamin B1; Future -     Reticulocytes; Future  Coronary artery disease involving native coronary artery of native heart without angina pectoris- Will treat his risk factors with blood pressure control, blood sugar control, statin therapy, and an aspirin a day. -     Lipid panel; Future -     atorvastatin (LIPITOR) 20 MG tablet; Take 1 tablet (20 mg total) by mouth daily.  Diabetic nephropathy associated with type 2 diabetes mellitus (HCC) -     losartan (COZAAR) 100 MG tablet; Take 0.5 tablets (50 mg total) by mouth daily.  Prostate cancer metastatic to bone [facility])- This is being treated by oncology.  Pure hyperglyceridemia -     omega-3 acid ethyl esters (LOVAZA) 1 g capsule; Take 2 capsules (2 g total) by mouth 2 (two) times daily.   I have discontinued Itamar Mcgowan. Runde's colestipol, furosemide, and HYDROcodone-acetaminophen. I have also changed his metFORMIN and losartan. Additionally, I am having him start on omega-3 acid ethyl esters and atorvastatin. Lastly, I am having him maintain his carvedilol, potassium chloride, Multiple Vitamins-Minerals (CENTRUM ADULTS PO), cephALEXin, and (Leuprolide Acetate, 6 Month, (LUPRON DEPOT, 20-MONTH,  IM)).  Meds ordered this encounter  Medications  . metFORMIN (GLUCOPHAGE) 500 MG tablet    Sig: Take 1 tablet (500 mg total) by mouth 2 (two) times daily with a meal.    Dispense:  180 tablet    Refill:  1  . omega-3 acid ethyl esters (LOVAZA) 1 g capsule    Sig: Take 2 capsules (2 g total) by mouth 2 (two) times daily.    Dispense:  360 capsule    Refill:  1  . atorvastatin (LIPITOR) 20 MG tablet    Sig: Take 1 tablet (20 mg total) by mouth daily.    Dispense:  90 tablet    Refill:  1  . losartan (COZAAR) 100 MG tablet    Sig: Take 0.5 tablets (50 mg total) by mouth daily.    Dispense:  90 tablet    Refill:  1     Follow-up: Return in about 6 months (around 09/12/2018).  Scarlette Calico, MD

## 2018-03-13 NOTE — Telephone Encounter (Signed)
APPT 5/24 WITH Cecilie Kicks, NP

## 2018-03-13 NOTE — Telephone Encounter (Signed)
I lmom for surgery scheduler at [facility] for Dr. Deretha Emory, pt has appt with Cecilie Kicks, NP 04/06/18 @ 11 am. Any questions please call [phone].

## 2018-03-14 ENCOUNTER — Encounter: Payer: Self-pay | Admitting: Internal Medicine

## 2018-03-14 DIAGNOSIS — E781 Pure hyperglyceridemia: Secondary | ICD-10-CM | POA: Insufficient documentation

## 2018-03-14 LAB — THYROID PANEL WITH TSH
FREE THYROXINE INDEX: 2.5 (ref 1.4–3.8)
T3 Uptake: 35 % (ref 22–35)
T4, Total: 7.2 ug/dL (ref 4.9–10.5)
TSH: 2.02 m[IU]/L (ref 0.40–4.50)

## 2018-03-14 MED ORDER — LOSARTAN POTASSIUM 100 MG PO TABS: 50.0000 mg | ORAL_TABLET | Freq: Every day | ORAL | 1 refills | Status: AC

## 2018-03-14 MED ORDER — OMEGA-3-ACID ETHYL ESTERS 1 G PO CAPS
2.0000 g | ORAL_CAPSULE | Freq: Two times a day (BID) | ORAL | 1 refills | Status: AC
Start: 2018-03-14 — End: ?

## 2018-03-14 MED ORDER — ASPIRIN EC 81 MG PO TBEC: 81.0000 mg | DELAYED_RELEASE_TABLET | Freq: Every day | ORAL | 1 refills | Status: DC

## 2018-03-14 MED ORDER — ATORVASTATIN CALCIUM 20 MG PO TABS: 20.0000 mg | ORAL_TABLET | Freq: Every day | ORAL | 1 refills | Status: AC

## 2018-03-16 ENCOUNTER — Ambulatory Visit: Payer: Medicare Other | Admitting: Podiatry

## 2018-03-16 ENCOUNTER — Encounter: Payer: Self-pay | Admitting: Podiatry

## 2018-03-16 DIAGNOSIS — M79676 Pain in unspecified toe(s): Secondary | ICD-10-CM

## 2018-03-16 DIAGNOSIS — E1140 Type 2 diabetes mellitus with diabetic neuropathy, unspecified: Secondary | ICD-10-CM | POA: Diagnosis not present

## 2018-03-16 DIAGNOSIS — B351 Tinea unguium: Secondary | ICD-10-CM | POA: Diagnosis not present

## 2018-03-16 LAB — VITAMIN B1: Vitamin B1 (Thiamine): 16 nmol/L (ref 8–30)

## 2018-03-16 NOTE — Progress Notes (Signed)
Patient ID: Theodore [name], male   DOB: 1946-01-21, 72 y.o.   MRN: [id number] Complaint:  Visit Type: Patient returns to my office for continued preventative foot care services. Complaint: Patient states" my nails have grown long and thick and become painful to walk and wear shoes" Patient has been diagnosed with DM with no foot complications. The patient presents for preventative foot care services. No changes to ROS.    Podiatric Exam: Vascular: dorsalis pedis and posterior tibial pulses are not  palpable bilateral. Capillary return is immediate. Temperature gradient is WNL.   Sensorium: Normal Semmes Weinstein monofilament test. Normal tactile sensation bilaterally. Nail Exam: Pt has thick disfigured discolored nails with subungual debris noted bilateral entire nail hallux through 5th toes  B/L Ulcer Exam: There is no evidence of ulcer or pre-ulcerative changes or infection. Orthopedic Exam: Muscle tone and strength are WNL. No limitations in general ROM. No crepitus or effusions noted. Foot type and digits show no abnormalities. Bony prominences are unremarkable. Skin: No Porokeratosis. No infection or ulcers.   No drainage or infection  Diagnosis:  Onychomycosis, , Pain in right toe, pain in left toes  Treatment & Plan Procedures and Treatment: Consent by patient was obtained for treatment procedures. The patient understood the discussion of treatment and procedures well. All questions were answered thoroughly reviewed. Debridement of mycotic and hypertrophic toenails, 1 through 5 bilateral and clearing of subungual debris. No ulceration, no infection noted.  Return Visit-Office Procedure: Patient instructed to return to the office for a follow up visit 10 weeks for continued evaluation and treatment.   Gardiner Barefoot DPM

## 2018-03-19 ENCOUNTER — Ambulatory Visit (HOSPITAL_COMMUNITY)
Admission: RE | Admit: 2018-03-19 | Discharge: 2018-03-19 | Disposition: A | Discharge status: Home / Self Care | Payer: Medicare Other | Source: Ambulatory Visit | Attending: Urology | Admitting: Urology

## 2018-03-19 DIAGNOSIS — Y838 Other surgical procedures as the cause of abnormal reaction of the patient, or of later complication, without mention of misadventure at the time of the procedure: Secondary | ICD-10-CM | POA: Insufficient documentation

## 2018-03-19 DIAGNOSIS — T83191A Other mechanical complication of urinary sphincter implant, initial encounter: Secondary | ICD-10-CM | POA: Insufficient documentation

## 2018-03-19 MED ORDER — LIDOCAINE HCL URETHRAL/MUCOSAL 2 % EX GEL
CUTANEOUS | Status: AC
  Filled 2018-03-19: qty 30

## 2018-03-19 MED ORDER — IOPAMIDOL (ISOVUE-300) INJECTION 61%
INTRAVENOUS | Status: AC
  Administered 2018-03-19: 30 mL
  Filled 2018-03-19: qty 150

## 2018-03-19 NOTE — Discharge Summary (Signed)
Physician Discharge Summary  Patient ID: Theodore [name] MRN: [id number] DOB/AGE: May 21, 1946 72 y.o.  Admit date: 02/28/2018 Discharge date: 03/02/2018 Admission Diagnoses: infeced AUS Discharge Diagnoses:  Active Problems:   Infection associated with urinary sphincter implant [facility])   Discharged Condition: good  Hospital Course. The patient was admitted for IV antibiotics and scrotal swelling. The swelling significantly improved on IV antibiotics. The patient remained afebrile for 2 days.  th Prior to discharge the pt was tolerating a regular diet, pain was controlled on PO pain meds, they were ambulating without difficulty, and they had normal bowel function.   Consults: None  Significant Diagnostic Studies: none  Treatments: antibiotics: vancomycin and Zosyn  Discharge Exam: Blood pressure 127/65, pulse 89, temperature 98 F (36.7 C), resp. rate 20, height 5\' 5"  (1.651 m), weight 101.9 kg (224 lb 9.6 oz), SpO2 95 %. General appearance: alert, cooperative and appears stated age Head: Normocephalic, without obvious abnormality, atraumatic Nose: Nares normal. Septum midline. Mucosa normal. No drainage or sinus tenderness. Resp: clear to auscultation bilaterally Cardio: regular rate and rhythm, S1, S2 normal, no murmur, click, rub or gallop GI: soft, non-tender; bowel sounds normal; no masses,  no organomegaly Male genitalia: penis: no lesions or discharge. testes: no masses or tenderness. no hernias Extremities: extremities normal, atraumatic, no cyanosis or edema Neurologic: Grossly normal  Disposition: Discharge disposition: 01-Home or Self Care       Discharge Instructions    Discharge patient   Complete by:  As directed    Discharge disposition:  01-Home or Self Care   Discharge patient date:  03/02/2018     Allergies as of 03/02/2018      Reactions   Sulfonamide Derivatives    "reaction" as infant according to his mother      Medication List    STOP taking  these medications   aspirin 325 MG tablet   LUPRON IJ   oxyCODONE-acetaminophen 10-325 MG tablet Commonly known as:  PERCOCET     TAKE these medications   carvedilol 6.25 MG tablet Commonly known as:  COREG take 1 tablet by mouth twice a day WITH A MEAL   CENTRUM ADULTS PO Take 1 tablet by mouth daily.   cephALEXin 500 MG capsule Commonly known as:  KEFLEX Take 1 capsule (500 mg total) by mouth 3 (three) times daily. What changed:  when to take this   potassium chloride 10 MEQ tablet Commonly known as:  K-DUR Take 1 tablet (10 mEq total) by mouth daily. F/u appt is due must make appt for future refills      Follow-up Information    Franchot Gallo, MD Follow up.   Specialty:  Urology Why:  we will call you Contact information: [place] [address] [phone]           Signed: Nicolette Bang 03/19/2018, 7:53 AM

## 2018-03-21 ENCOUNTER — Other Ambulatory Visit: Payer: Self-pay | Admitting: Urology

## 2018-03-23 ENCOUNTER — Inpatient Hospital Stay: Payer: Medicare Other | Attending: Oncology | Admitting: Oncology

## 2018-03-23 ENCOUNTER — Telehealth: Payer: Self-pay | Admitting: Oncology

## 2018-03-23 VITALS — BP 148/70 | HR 87 | Temp 98.0°F | Resp 17 | Ht 65.0 in | Wt 239.2 lb

## 2018-03-23 DIAGNOSIS — Z79899 Other long term (current) drug therapy: Secondary | ICD-10-CM | POA: Diagnosis not present

## 2018-03-23 DIAGNOSIS — Z7982 Long term (current) use of aspirin: Secondary | ICD-10-CM | POA: Diagnosis not present

## 2018-03-23 DIAGNOSIS — E119 Type 2 diabetes mellitus without complications: Secondary | ICD-10-CM | POA: Insufficient documentation

## 2018-03-23 DIAGNOSIS — Z923 Personal history of irradiation: Secondary | ICD-10-CM | POA: Insufficient documentation

## 2018-03-23 DIAGNOSIS — Z7984 Long term (current) use of oral hypoglycemic drugs: Secondary | ICD-10-CM | POA: Diagnosis not present

## 2018-03-23 DIAGNOSIS — C61 Malignant neoplasm of prostate: Secondary | ICD-10-CM | POA: Diagnosis not present

## 2018-03-23 DIAGNOSIS — Z8546 Personal history of malignant neoplasm of prostate: Secondary | ICD-10-CM | POA: Diagnosis not present

## 2018-03-23 NOTE — Telephone Encounter (Signed)
Appt scheduled AVS/Calendar printed per 5/10 los

## 2018-03-23 NOTE — Progress Notes (Signed)
Hematology and Oncology Follow Up Visit  Theodore [name] [id number] May 08, 1946 72 y.o. 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  At that time his Gleason score was 4+4 = 8 and a PSA of 18.   Prior Therapy: He underwent a radical prostatectomy in April 1994. He had lymph node involvement at that time. He received adjuvant radiation therapy completed in 1994.  His PSA went up to 11.3 in August 2009 and was started on Lupron at that time. He received intermittent androgen deprivation between 2010 and 2012.   His PSA was up to 7.06 and August 2016 and at that time he was started on Lupron continuously. His PSA was down to 1.62 in April 2017 and 1.06 in August 2017. In January 2018 his PSA was 1.51 and in May 2018 was 1.72 testosterone level of 42. Staging workup including a CT scan of the abdomen and pelvis which showed no evidence of disease. His bone scan obtained on 03/20/2017 showed 2 new foci of uptake in the calvarium.   Current therapy:  Androgen deprivation therapy with Lupron under the care of Dr. Diona [name].  Interim History: Theodore [name] presents today for a follow-up visit.  Since the last visit, he reports no major changes in his health.  He has missed previous follow-up appointments as of acute illness including flu and GI distress.  He was also hospitalized in April due to infection of a urinary sphincter implant.  He is feeling well and recovered from his recent illness and finishing a course of Keflex.  He received recent Lupron injection which was overdue.  He denies any side effects related to this treatment.  He denies any bone pain or pathological fractures.  He does not report any headaches, blurry vision, syncope or seizures. Does not report any fevers, chills or sweats.  Does not report any cough, wheezing or hemoptysis.  Does not report  any chest pain, palpitation, orthopnea or leg edema.  Does not report any nausea, vomiting or abdominal pain.  Does not report any constipation or diarrhea.  Does not report any skeletal complaints.    Does not report frequency, urgency or hematuria.  Does not report any skin rashes or lesions. Does not report any heat or cold intolerance.  Does not report any lymphadenopathy or petechiae.  Does not report any anxiety or depression.  Remaining review of systems is negative.    Medications: I have reviewed the patient's current medications.  Current Outpatient Medications  Medication Sig Dispense Refill  . aspirin EC 81 MG tablet Take 1 tablet (81 mg total) by mouth daily. 90 tablet 1  . atorvastatin (LIPITOR) 20 MG tablet Take 1 tablet (20 mg total) by mouth daily. 90 tablet 1  . carvedilol (COREG) 6.25 MG tablet take 1 tablet by mouth twice a day WITH A MEAL 180 tablet 1  . cephALEXin (KEFLEX) 500 MG capsule Take 1 capsule (500 mg total) by mouth 3 (three) times daily. 30 capsule 0  . Leuprolide Acetate, 6 Month, (LUPRON DEPOT, 101-MONTH, IM) Inject into the muscle.    . losartan (COZAAR) 100 MG tablet Take 0.5 tablets (50 mg total) by mouth daily. 90 tablet 1  . metFORMIN (GLUCOPHAGE) 500 MG tablet Take 1 tablet (500 mg total) by mouth 2 (two) times daily with a meal. 180 tablet 1  . Multiple Vitamins-Minerals (CENTRUM ADULTS PO) Take 1 tablet by mouth  daily.    . omega-3 acid ethyl esters (LOVAZA) 1 g capsule Take 2 capsules (2 g total) by mouth 2 (two) times daily. 360 capsule 1  . potassium chloride (K-DUR) 10 MEQ tablet Take 1 tablet (10 mEq total) by mouth daily. F/u appt is due must make appt for future refills 30 tablet 0   No current facility-administered medications for this visit.      Allergies:  Allergies  Allergen Reactions  . Sulfonamide Derivatives     "reaction" as infant according to his mother    Past Medical History, Surgical history, Social history, and Family History  were reviewed and updated.    Physical Exam: Blood pressure (!) 148/70, pulse 87, temperature 98 F (36.7 C), temperature source Oral, resp. rate 17, height 5\' 5"  (1.651 m), weight 239 lb 3.2 oz (108.5 kg), SpO2 97 %.   ECOG:  General appearance: alert and cooperative appeared without distress. Head: Normocephalic, without obvious abnormality Oropharynx: No oral thrush or ulcers. Eyes: No scleral icterus.  Pupils are equal and round reactive to light. Lymph nodes: Cervical, supraclavicular, and axillary nodes normal. Heart:regular rate and rhythm, S1, S2 normal, no murmur, click, rub or gallop Lung:chest clear, no wheezing, rales, normal symmetric air entry Abdomin: soft, non-tender, without masses or organomegaly. Neurological: No motor, sensory deficits.  Intact deep tendon reflexes. Skin: No rashes or lesions.  No ecchymosis or petechiae. Musculoskeletal: No joint deformity or effusion.     Lab Results: Lab Results  Component Value Date   WBC 7.6 03/13/2018   HGB 11.7 (L) 03/13/2018   HCT 34.5 (L) 03/13/2018   MCV 93.9 03/13/2018   PLT 353.0 03/13/2018     Chemistry      Component Value Date/Time   NA 142 03/13/2018 1452   NA 143 05/06/2016   K 4.3 03/13/2018 1452   CL 103 03/13/2018 1452   CO2 31 03/13/2018 1452   BUN 23 03/13/2018 1452   BUN 24 (A) 05/06/2016   CREATININE 1.26 03/13/2018 1452   CREATININE 1.42 (H) 07/20/2016 1400   GLU 115 05/06/2016      Component Value Date/Time   CALCIUM 9.1 03/13/2018 1452   ALKPHOS 68 02/28/2018 1505   AST 19 02/28/2018 1505   ALT 17 02/28/2018 1505   BILITOT 0.4 02/28/2018 1505     Results for Theodore, [name] (MRN [id number]) as of 03/23/2018 15:35  Ref. Range 02/28/2018 15:05  Prostatic Specific Antigen Latest Ref Range: 0.00 - 4.00 ng/mL 3.20    Impression and Plan:  72 year old man with:  1.  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  He was initially  diagnosed in 1994. His Gleason score is 4+4 = 8 and a PSA of 18.  He developed recurrent disease in 2012 and has been on androgen deprivation since that time.  He developed castration resistant disease in May 2018.  He remains on active surveillance and has not received any additional therapy.  His PSA on February 28, 2018 was 3.2 with a doubling time of close to 12 months.  The natural course of castration-resistant prostate cancer was discussed today and treatment options were reviewed again.  These options would include continued observation and surveillance versus starting Xtandi.  Complication associated with this medication was reviewed today including fatigue, hypertension, edema and hematuria.  After discussion today, he opted to defer this option given his upcoming surgery.  His PSA rise has been very slow and he is asymptomatic.  For the time being we will continue to monitor him closely and allow him to undergo his upcoming surgery and recover from it and consider Xtandi after that.   2. Androgen depravation: Remains on Lupron under the care of Dr. Diona [name] which she continues to receive indefinitely.  3. Bone directed therapy: This will be deferred for the time being unless he develops worsening bony metastatic disease.  4. Follow-up: Will be in September 2019.  15  minutes was spent with the patient face-to-face today.  More than 50% of time was dedicated to patient counseling, education and coordination of his care.      Zola Button, MD 5/10/20193:42 PM

## 2018-03-30 ENCOUNTER — Encounter: Payer: Self-pay | Admitting: Cardiology

## 2018-04-05 NOTE — Progress Notes (Signed)
Cardiology Office Note   Date:  04/06/2018   ID:  Dam, Ashraf 1946-02-23, MRN [id number]  PCP:  [name], MD  Cardiologist:  Dr. [name]    Chief Complaint  Patient presents with  . Pre-op Exam    urology      History of Present Illness: Theodore [name] is a 72 y.o. male who presents for pre-op clearance. Explant of artifical urinary Sphincter.     Last visit with Dr. [name] was 01/05/17  He has a history of metastatic prostate cancer, DM, CAD and PVD.   Pt with hx of cardiac cath 1998 with occluded, collateralized RCA and no lt sided disease.  In 05/2016 with admit for cellulitis and sepsis, he had + troponin and CHF.  TEE without endocarditis.  Has had normal EF on echo and G2 DD.  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 He denies any recent CP, occ mild DOE.  He most likely has sleep apnea with waking sudden SOB if sleeps on his back.    No palpitations.  He tells me he will be wearing a diaper after surgery.     Past Medical History:  Diagnosis Date  . CAD (coronary artery disease)   . Cellulitis 10/18-20/2012   RLE   . Diabetes mellitus    controlled  . Diverticulitis 02/19/13  . Gallstones   . Hyperlipidemia   . Hypertension   . Myocardial infarction (HCC)    inferior wall  . Paroxysmal nocturnal dyspnea   . Prostate cancer [facility])    prostate  . PVD (peripheral vascular disease) ([place])     Past Surgical History:  Procedure Laterality Date  . [place]  . CHOLECYSTECTOMY    . PENILE PROSTHESIS  REMOVAL     swich(sphincter)...removed 04-2008, replaced 02-2009  . PROSTATE SURGERY    . TEE WITHOUT CARDIOVERSION N/A 06/01/2016   Procedure: TRANSESOPHAGEAL ECHOCARDIOGRAM (TEE);  Surgeon: Sanda Klein, MD;  Location: [facility] ENDOSCOPY;  Service: Cardiovascular;  Laterality: N/A;  . TONSILLECTOMY       Current Outpatient Medications  Medication Sig Dispense Refill  . aspirin EC 81 MG  tablet Take 1 tablet (81 mg total) by mouth daily. 90 tablet 1  . atorvastatin (LIPITOR) 20 MG tablet Take 1 tablet (20 mg total) by mouth daily. 90 tablet 1  . carvedilol (COREG) 6.25 MG tablet take 1 tablet by mouth twice a day WITH A MEAL 180 tablet 1  . cephALEXin (KEFLEX) 500 MG capsule Take 500 mg by mouth as directed.    . colestipol (COLESTID) 5 g granules Take 5 g by mouth daily with supper.    . furosemide (LASIX) 40 MG tablet Take 40 mg by mouth 2 (two) times daily.    Marland Kitchen Leuprolide Acetate, 6 Month, (LUPRON DEPOT, 31-MONTH, IM) Inject 1 each into the muscle every 6 (six) months.     Marland Kitchen losartan (COZAAR) 100 MG tablet Take 0.5 tablets (50 mg total) by mouth daily. 90 tablet 1  . metFORMIN (GLUCOPHAGE) 500 MG tablet Take 1 tablet (500 mg total) by mouth 2 (two) times daily with a meal. 180 tablet 1  . Multiple Vitamins-Minerals (CENTRUM SILVER 50+MEN PO) Take 1 tablet by mouth daily.    Marland Kitchen omega-3 acid ethyl esters (LOVAZA) 1 g capsule Take 2 capsules (2 g total) by mouth 2 (two) times daily. 360 capsule 1  . potassium chloride (K-DUR) 10 MEQ  tablet Take 1 tablet (10 mEq total) by mouth daily. F/u appt is due must make appt for future refills 30 tablet 0   No current facility-administered medications for this visit.     Allergies:   Sulfonamide derivatives    Social History:  The patient  reports that he quit smoking about 31 years ago. His smoking use included cigarettes. He has never used smokeless tobacco. He reports that he does not drink alcohol or use drugs.   Family History:  The patient's family history includes Alcoholism in his mother; Diabetes in his brother and father; Gout in his brother; Heart disease in his father; Kidney disease in his brother; Pneumonia in his mother; Seizures in his father; Stroke in his father.    ROS:  General:no colds or fevers, no weight changes Skin:no rashes or ulcers HEENT:no blurred vision, no congestion CV:see HPI PUL:see HPI GI:no  diarrhea constipation or melena, no indigestion GU:no hematuria, no dysuria MS:no joint pain, no claudication Neuro:no syncope, no lightheadedness Endo:+ diabetes stable followed by PCP, no thyroid disease  Wt Readings from Last 3 Encounters:  04/06/18 241 lb 8 oz (109.5 kg)  03/23/18 239 lb 3.2 oz (108.5 kg)  03/13/18 238 lb 8 oz (108.2 kg)     PHYSICAL EXAM: VS:  BP (!) 150/86   Pulse 82   Ht 5\' 5"  (1.651 m)   Wt 241 lb 8 oz (109.5 kg)   SpO2 95%   BMI 40.19 kg/m  , BMI Body mass index is 40.19 kg/m. General:Pleasant affect, NAD Skin:Warm and dry, brisk capillary refill HEENT:normocephalic, sclera clear, mucus membranes moist, poor dental  Neck:supple, no JVD, no bruits  Heart:S1S2 RRR without murmur, gallup, rub or click Lungs:clear without rales, rhonchi, or wheezes JIR:CVELF, soft, non tender, + BS, do not palpate liver spleen or masses Ext:1-2+ lower ext edema, 1+ pedal pulses, 2+ radial pulses, ulcers have resolved. Neuro:alert and oriented X 3, MAE, follows commands, + facial symmetry    EKG:  EKG is ordered today. The ekg ordered today demonstrates SR no changes from his last EKG.    Recent Labs: 02/28/2018: ALT 17 03/13/2018: BUN 23; Creatinine, Ser 1.26; Hemoglobin 11.7; Platelets 353.0; Potassium 4.3; Sodium 142; TSH 2.02    Lipid Panel    Component Value Date/Time   CHOL 195 03/13/2018 1452   TRIG (H) 03/13/2018 1452    485.0 Triglyceride is over 400; calculations on Lipids are invalid.   HDL 31.90 (L) 03/13/2018 1452   CHOLHDL 6 03/13/2018 1452   VLDL 16.2 07/08/2015 1004   LDLCALC 46 05/06/2016   LDLDIRECT 102.0 03/13/2018 1452       Other studies Reviewed: Additional studies/ records that were reviewed today include: . 05/25/16 Study Conclusions  - Left ventricle: The cavity size was normal. There was moderate   focal basal hypertrophy of the septum. Systolic function was   vigorous. The estimated ejection fraction was in the range of  65%   to 70%. Wall motion was normal; there were no regional wall   motion abnormalities. Features are consistent with a pseudonormal   left ventricular filling pattern, with concomitant abnormal   relaxation and increased filling pressure (grade 2 diastolic   dysfunction). Doppler parameters are consistent with elevated   ventricular end-diastolic filling pressure. - Aortic root: The aortic root was normal in size. - Mitral valve: Calcified annulus. Mildly thickened leaflets . - Left atrium: The atrium was mildly dilated. - Right atrium: The atrium was normal in size. -  Tricuspid valve: There was mild regurgitation. - Pulmonary arteries: Systolic pressure was within the normal   range. - Inferior vena cava: The vessel was normal in size. - Pericardium, extracardiac: There was no pericardial effusion.  Impressions:  - limited study quality secondary to tachycardia during the   acquisition.  TEE 06/01/18 Study Conclusions  - Left ventricle: There was mild concentric hypertrophy. Systolic   function was normal. The estimated ejection fraction was in the   range of 55% to 60%. Wall motion was normal; there were no   regional wall motion abnormalities. - Aortic valve: No evidence of vegetation. - Mitral valve: No evidence of vegetation. - Left atrium: The atrium was mildly dilated. No evidence of   thrombus in the atrial cavity or appendage. - Right atrium: No evidence of thrombus in the atrial cavity or   appendage. - Atrial septum: No defect or patent foramen ovale was identified. - Tricuspid valve: No evidence of vegetation. - Pulmonic valve: No evidence of vegetation.  Impressions:  - No evidence of endocarditis.  ASSESSMENT AND PLAN:  1.  Pre-op exam for Explant of artifical urinary Sphincter.  Pt is at moderate risk for surgery with hx of known CAD with total RCA. And collaterals from Lt which was without disease.  He is not active and any stress test would be +  due to total RCA occlusion.   His EKG is stable.  I discussed with Dr. Meda Coffee the DOD and we will clear at intermediate risk for surgery.   It is ok to stop ASA for procedure for 5 days and resume asap afterwards.   2.   CAD no angina, continue to monitor.  3.    Chronic diastolic HF stable with lower ext edema.  He does eat salt which adds to the edema.  He will follow up with Dr. [name] in 3 months.   Possibly sleep apnea would recommend sleep study.    Current medicines are reviewed with the patient today.  The patient Has no concerns regarding medicines.  The following changes have been made:  See above Labs/ tests ordered today include:see above  Disposition:   FU:  see above  Signed, Cecilie Kicks, NP  04/06/2018 11:45 AM    [address], [place], [place], [place] Phone: [phone]; Fax: [phone]

## 2018-04-06 ENCOUNTER — Ambulatory Visit: Payer: Medicare Other | Admitting: Cardiology

## 2018-04-06 ENCOUNTER — Encounter: Payer: Self-pay | Admitting: Cardiology

## 2018-04-06 VITALS — BP 150/86 | HR 82 | Ht 65.0 in | Wt 241.5 lb

## 2018-04-06 DIAGNOSIS — Z01818 Encounter for other preprocedural examination: Secondary | ICD-10-CM

## 2018-04-06 DIAGNOSIS — I2510 Atherosclerotic heart disease of native coronary artery without angina pectoris: Secondary | ICD-10-CM | POA: Diagnosis not present

## 2018-04-06 DIAGNOSIS — I10 Essential (primary) hypertension: Secondary | ICD-10-CM | POA: Diagnosis not present

## 2018-04-06 DIAGNOSIS — I5032 Chronic diastolic (congestive) heart failure: Secondary | ICD-10-CM

## 2018-04-06 NOTE — Progress Notes (Signed)
LOV/CARDIAC CLEARANCE WITH INGOLD,NP 04-06-18 Epic   HGBA1C 03-13-18 Epic   ECHO 06-01-16 Epic

## 2018-04-06 NOTE — Patient Instructions (Signed)
JEDADIAH ABDALLAH  04/06/2018   Your procedure is scheduled on: 04-12-18   Report to [facility] Main  Entrance    Report to admitting at 7:30AM    Call this number if you have problems the morning of surgery [phone]     Remember: Do not eat food or drink liquids :After Midnight.     Take these medicines the morning of surgery with A SIP OF WATER: ATORVASTATIN, CARVEDILOL, CEPHALEXIN, COLESTIPOL                                 You may not have any metal on your body including hair pins and              piercings  Do not wear jewelry, make-up, lotions, powders or perfumes, deodorant                          Men may shave face and neck.   Do not bring valuables to the hospital. [place].  Contacts, dentures or bridgework may not be worn into surgery.  Leave suitcase in the car. After surgery it may be brought to your room.                  Please read over the following fact sheets you were given: _____________________________________________________________________             How to Manage Your Diabetes Before and After Surgery  Why is it important to control my blood sugar before and after surgery? . Improving blood sugar levels before and after surgery helps healing and can limit problems. . A way of improving blood sugar control is eating a healthy diet by: o  Eating less sugar and carbohydrates o  Increasing activity/exercise o  Talking with your doctor about reaching your blood sugar goals . High blood sugars (greater than 180 mg/dL) can raise your risk of infections and slow your recovery, so you will need to focus on controlling your diabetes during the weeks before surgery. . Make sure that the doctor who takes care of your diabetes knows about your planned surgery including the date and location.  How do I manage my blood sugar before surgery? . Check your blood sugar at least 4  times a day, starting 2 days before surgery, to make sure that the level is not too high or low. o Check your blood sugar the morning of your surgery when you wake up and every 2 hours until you get to the Short Stay unit. . If your blood sugar is less than 70 mg/dL, you will need to treat for low blood sugar: o Do not take insulin. o Treat a low blood sugar (less than 70 mg/dL) with  cup of clear juice (cranberry or apple), 4 glucose tablets, OR glucose gel. o Recheck blood sugar in 15 minutes after treatment (to make sure it is greater than 70 mg/dL). If your blood sugar is not greater than 70 mg/dL on recheck, call [phone] for further instructions. . Report your blood sugar to the short stay nurse when you get to Short Stay.  . If you are admitted to the hospital after surgery: o Your blood  sugar will be checked by the staff and you will probably be given insulin after surgery (instead of oral diabetes medicines) to make sure you have good blood sugar levels. o The goal for blood sugar control after surgery is 80-180 mg/dL.   WHAT DO I DO ABOUT MY DIABETES MEDICATION?   . THE DAY BEFORE SURGERY, take METFORMIN as normal       . THE MORNING OF SURGERY, Do not take oral diabetes medicines (pills)!   Patient Signature:  Date:   Nurse Signature:  Date:   Reviewed and Endorsed by [facility] Patient Education Committee, August 2015    [facility] - Preparing for Surgery Before surgery, you can play an important role.  Because skin is not sterile, your skin needs to be as free of germs as possible.  You can reduce the number of germs on your skin by washing with CHG (chlorahexidine gluconate) soap before surgery.  CHG is an antiseptic cleaner which kills germs and bonds with the skin to continue killing germs even after washing. Please DO NOT use if you have an allergy to CHG or antibacterial soaps.  If your skin becomes reddened/irritated stop using the CHG and inform your nurse  when you arrive at Short Stay. Do not shave (including legs and underarms) for at least 48 hours prior to the first CHG shower.  You may shave your face/neck. Please follow these instructions carefully:  1.  Shower with CHG Soap the night before surgery and the  morning of Surgery.  2.  If you choose to wash your hair, wash your hair first as usual with your  normal  shampoo.  3.  After you shampoo, rinse your hair and body thoroughly to remove the  shampoo.                           4.  Use CHG as you would any other liquid soap.  You can apply chg directly  to the skin and wash                       Gently with a scrungie or clean washcloth.  5.  Apply the CHG Soap to your body ONLY FROM THE NECK DOWN.   Do not use on face/ open                           Wound or open sores. Avoid contact with eyes, ears mouth and genitals (private parts).                       Wash face,  Genitals (private parts) with your normal soap.             6.  Wash thoroughly, paying special attention to the area where your surgery  will be performed.  7.  Thoroughly rinse your body with warm water from the neck down.  8.  DO NOT shower/wash with your normal soap after using and rinsing off  the CHG Soap.                9.  Pat yourself dry with a clean towel.            10.  Wear clean pajamas.            11.  Place clean sheets on your bed the night of your first  shower and do not  sleep with pets. Day of Surgery : Do not apply any lotions/deodorants the morning of surgery.  Please wear clean clothes to the hospital/surgery center.  FAILURE TO FOLLOW THESE INSTRUCTIONS MAY RESULT IN THE CANCELLATION OF YOUR SURGERY PATIENT SIGNATURE_________________________________  NURSE SIGNATURE__________________________________  ________________________________________________________________________

## 2018-04-06 NOTE — Patient Instructions (Addendum)
Medication Instructions:  Your physician recommends that you continue on your current medications as directed. Please refer to the Current Medication list given to you today.   Labwork: None ordered  Testing/Procedures: None ordered  Follow-Up: Your physician recommends that you schedule a follow-up appointment in: 3 MONTHS WITH DR. NISHAN   Any Other Special Instructions Will Be Listed Below (If Applicable).   If you need a refill on your cardiac medications before your next appointment, please call your pharmacy.   

## 2018-04-10 ENCOUNTER — Encounter (HOSPITAL_COMMUNITY): Payer: Self-pay

## 2018-04-10 ENCOUNTER — Other Ambulatory Visit: Payer: Self-pay

## 2018-04-10 ENCOUNTER — Encounter (HOSPITAL_COMMUNITY)
Admission: RE | Admit: 2018-04-10 | Discharge: 2018-04-10 | Disposition: A | Discharge status: Home / Self Care | Payer: Medicare Other | Source: Ambulatory Visit | Attending: Urology | Admitting: Urology

## 2018-04-10 DIAGNOSIS — I252 Old myocardial infarction: Secondary | ICD-10-CM | POA: Diagnosis not present

## 2018-04-10 DIAGNOSIS — Z87891 Personal history of nicotine dependence: Secondary | ICD-10-CM | POA: Diagnosis not present

## 2018-04-10 DIAGNOSIS — T83191A Other mechanical complication of urinary sphincter implant, initial encounter: Secondary | ICD-10-CM | POA: Diagnosis not present

## 2018-04-10 DIAGNOSIS — Y738 Miscellaneous gastroenterology and urology devices associated with adverse incidents, not elsewhere classified: Secondary | ICD-10-CM | POA: Diagnosis not present

## 2018-04-10 DIAGNOSIS — I2510 Atherosclerotic heart disease of native coronary artery without angina pectoris: Secondary | ICD-10-CM | POA: Diagnosis not present

## 2018-04-10 DIAGNOSIS — Z6839 Body mass index (BMI) 39.0-39.9, adult: Secondary | ICD-10-CM | POA: Diagnosis not present

## 2018-04-10 DIAGNOSIS — Z79899 Other long term (current) drug therapy: Secondary | ICD-10-CM | POA: Diagnosis not present

## 2018-04-10 DIAGNOSIS — E119 Type 2 diabetes mellitus without complications: Secondary | ICD-10-CM | POA: Diagnosis not present

## 2018-04-10 DIAGNOSIS — Z7984 Long term (current) use of oral hypoglycemic drugs: Secondary | ICD-10-CM | POA: Diagnosis not present

## 2018-04-10 DIAGNOSIS — I10 Essential (primary) hypertension: Secondary | ICD-10-CM | POA: Diagnosis not present

## 2018-04-10 DIAGNOSIS — E785 Hyperlipidemia, unspecified: Secondary | ICD-10-CM | POA: Diagnosis not present

## 2018-04-10 DIAGNOSIS — Z8546 Personal history of malignant neoplasm of prostate: Secondary | ICD-10-CM | POA: Diagnosis not present

## 2018-04-10 LAB — BASIC METABOLIC PANEL
Anion gap: 12 (ref 5–15)
BUN: 29 mg/dL — AB (ref 6–20)
CHLORIDE: 101 mmol/L (ref 101–111)
CO2: 27 mmol/L (ref 22–32)
CREATININE: 1.4 mg/dL — AB (ref 0.61–1.24)
Calcium: 9.2 mg/dL (ref 8.9–10.3)
GFR calc Af Amer: 56 mL/min — ABNORMAL LOW (ref >60)
GFR calc non Af Amer: 48 mL/min — ABNORMAL LOW (ref >60)
Glucose, Bld: 105 mg/dL — ABNORMAL HIGH (ref 65–99)
Potassium: 4.1 mmol/L (ref 3.5–5.1)
SODIUM: 140 mmol/L (ref 135–145)

## 2018-04-10 LAB — CBC
HCT: 35.6 % — ABNORMAL LOW (ref 39.0–52.0)
Hemoglobin: 11.6 g/dL — ABNORMAL LOW (ref 13.0–17.0)
MCH: 31.4 pg (ref 26.0–34.0)
MCHC: 32.6 g/dL (ref 30.0–36.0)
MCV: 96.5 fL (ref 78.0–100.0)
PLATELETS: 299 10*3/uL (ref 150–400)
RBC: 3.69 MIL/uL — ABNORMAL LOW (ref 4.22–5.81)
RDW: 14.8 % (ref 11.5–15.5)
WBC: 8.8 10*3/uL (ref 4.0–10.5)

## 2018-04-10 LAB — GLUCOSE, CAPILLARY: GLUCOSE-CAPILLARY: 127 mg/dL — AB (ref 65–99)

## 2018-04-10 NOTE — Progress Notes (Signed)
Bmp routed via epic to dr Diona Fanti

## 2018-04-12 ENCOUNTER — Other Ambulatory Visit: Payer: Self-pay

## 2018-04-12 ENCOUNTER — Ambulatory Visit (HOSPITAL_COMMUNITY): Payer: Medicare Other | Admitting: Certified Registered Nurse Anesthetist

## 2018-04-12 ENCOUNTER — Encounter (HOSPITAL_COMMUNITY): Payer: Self-pay

## 2018-04-12 ENCOUNTER — Encounter (HOSPITAL_COMMUNITY)
Admission: RE | Disposition: A | Discharge status: Home / Self Care | Payer: Self-pay | Source: Ambulatory Visit | Attending: Urology

## 2018-04-12 ENCOUNTER — Observation Stay (HOSPITAL_COMMUNITY)
Admission: RE | Admit: 2018-04-12 | Discharge: 2018-04-13 | Disposition: A | Discharge status: Home / Self Care | Payer: Medicare Other | Source: Ambulatory Visit | Attending: Urology | Admitting: Urology

## 2018-04-12 DIAGNOSIS — T83111A Breakdown (mechanical) of urinary sphincter implant, initial encounter: Secondary | ICD-10-CM | POA: Diagnosis present

## 2018-04-12 DIAGNOSIS — I252 Old myocardial infarction: Secondary | ICD-10-CM | POA: Insufficient documentation

## 2018-04-12 DIAGNOSIS — T83191A Other mechanical complication of urinary sphincter implant, initial encounter: Secondary | ICD-10-CM | POA: Diagnosis not present

## 2018-04-12 DIAGNOSIS — E119 Type 2 diabetes mellitus without complications: Secondary | ICD-10-CM | POA: Diagnosis not present

## 2018-04-12 DIAGNOSIS — I2510 Atherosclerotic heart disease of native coronary artery without angina pectoris: Secondary | ICD-10-CM | POA: Diagnosis not present

## 2018-04-12 DIAGNOSIS — Z87891 Personal history of nicotine dependence: Secondary | ICD-10-CM | POA: Insufficient documentation

## 2018-04-12 DIAGNOSIS — E785 Hyperlipidemia, unspecified: Secondary | ICD-10-CM | POA: Insufficient documentation

## 2018-04-12 DIAGNOSIS — Z79899 Other long term (current) drug therapy: Secondary | ICD-10-CM | POA: Insufficient documentation

## 2018-04-12 DIAGNOSIS — I10 Essential (primary) hypertension: Secondary | ICD-10-CM | POA: Insufficient documentation

## 2018-04-12 DIAGNOSIS — Z6839 Body mass index (BMI) 39.0-39.9, adult: Secondary | ICD-10-CM | POA: Insufficient documentation

## 2018-04-12 DIAGNOSIS — Z7984 Long term (current) use of oral hypoglycemic drugs: Secondary | ICD-10-CM | POA: Insufficient documentation

## 2018-04-12 DIAGNOSIS — Y738 Miscellaneous gastroenterology and urology devices associated with adverse incidents, not elsewhere classified: Secondary | ICD-10-CM | POA: Insufficient documentation

## 2018-04-12 DIAGNOSIS — Z8546 Personal history of malignant neoplasm of prostate: Secondary | ICD-10-CM | POA: Insufficient documentation

## 2018-04-12 HISTORY — PX: URINARY SPHINCTER IMPLANT: SHX2624

## 2018-04-12 LAB — GLUCOSE, CAPILLARY
GLUCOSE-CAPILLARY: 134 mg/dL — AB (ref 65–99)
GLUCOSE-CAPILLARY: 203 mg/dL — AB (ref 65–99)
Glucose-Capillary: 130 mg/dL — ABNORMAL HIGH (ref 65–99)
Glucose-Capillary: 209 mg/dL — ABNORMAL HIGH (ref 65–99)

## 2018-04-12 SURGERY — INSERTION, ARTIFICIAL URINARY SPHINCTER
Anesthesia: General

## 2018-04-12 MED ORDER — ONDANSETRON HCL 4 MG/2ML IJ SOLN: 4.0000 mg | Freq: Once | INTRAMUSCULAR | Status: DC | PRN

## 2018-04-12 MED ORDER — PHENYLEPHRINE HCL 10 MG/ML IJ SOLN
INTRAMUSCULAR | Status: DC | PRN
  Administered 2018-04-12: 40 ug via INTRAVENOUS
  Administered 2018-04-12 (×2): 80 ug via INTRAVENOUS
  Administered 2018-04-12: 40 ug via INTRAVENOUS

## 2018-04-12 MED ORDER — BUPIVACAINE HCL (PF) 0.25 % IJ SOLN
INTRAMUSCULAR | Status: AC
  Filled 2018-04-12: qty 30

## 2018-04-12 MED ORDER — BUPIVACAINE HCL (PF) 0.25 % IJ SOLN
INTRAMUSCULAR | Status: DC | PRN
  Administered 2018-04-12: 10 mL

## 2018-04-12 MED ORDER — ONDANSETRON HCL 4 MG/2ML IJ SOLN: 4.0000 mg | INTRAMUSCULAR | Status: DC | PRN

## 2018-04-12 MED ORDER — OXYCODONE HCL 5 MG PO TABS: 5.0000 mg | ORAL_TABLET | ORAL | Status: DC | PRN

## 2018-04-12 MED ORDER — ATORVASTATIN CALCIUM 20 MG PO TABS
20.0000 mg | ORAL_TABLET | Freq: Every day | ORAL | Status: DC
  Administered 2018-04-13: 20 mg via ORAL
  Filled 2018-04-12: qty 1

## 2018-04-12 MED ORDER — DEXAMETHASONE SODIUM PHOSPHATE 10 MG/ML IJ SOLN
INTRAMUSCULAR | Status: DC | PRN
  Administered 2018-04-12: 10 mg via INTRAVENOUS

## 2018-04-12 MED ORDER — FENTANYL CITRATE (PF) 100 MCG/2ML IJ SOLN: 25.0000 ug | INTRAMUSCULAR | Status: DC | PRN

## 2018-04-12 MED ORDER — SODIUM CHLORIDE 0.9 % IV SOLN
INTRAVENOUS | Status: DC
  Administered 2018-04-12 – 2018-04-13 (×2): via INTRAVENOUS

## 2018-04-12 MED ORDER — PROPOFOL 10 MG/ML IV BOLUS
INTRAVENOUS | Status: AC
Start: 2018-04-12 — End: ?
  Filled 2018-04-12: qty 20

## 2018-04-12 MED ORDER — CARVEDILOL 6.25 MG PO TABS
6.2500 mg | ORAL_TABLET | Freq: Two times a day (BID) | ORAL | Status: DC
  Administered 2018-04-12 – 2018-04-13 (×2): 6.25 mg via ORAL
  Filled 2018-04-12 (×2): qty 1

## 2018-04-12 MED ORDER — LIDOCAINE 2% (20 MG/ML) 5 ML SYRINGE
INTRAMUSCULAR | Status: DC | PRN
  Administered 2018-04-12: 80 mg via INTRAVENOUS

## 2018-04-12 MED ORDER — PHENYLEPHRINE 40 MCG/ML (10ML) SYRINGE FOR IV PUSH (FOR BLOOD PRESSURE SUPPORT)
PREFILLED_SYRINGE | INTRAVENOUS | Status: AC
  Filled 2018-04-12: qty 10

## 2018-04-12 MED ORDER — ONDANSETRON HCL 4 MG/2ML IJ SOLN
INTRAMUSCULAR | Status: AC
Start: 2018-04-12 — End: ?
  Filled 2018-04-12: qty 2

## 2018-04-12 MED ORDER — ASPIRIN EC 81 MG PO TBEC: 81.0000 mg | DELAYED_RELEASE_TABLET | Freq: Every day | ORAL | 1 refills | Status: AC

## 2018-04-12 MED ORDER — FENTANYL CITRATE (PF) 100 MCG/2ML IJ SOLN
INTRAMUSCULAR | Status: DC | PRN
  Administered 2018-04-12 (×5): 25 ug via INTRAVENOUS
  Administered 2018-04-12: 50 ug via INTRAVENOUS
  Administered 2018-04-12: 25 ug via INTRAVENOUS

## 2018-04-12 MED ORDER — FUROSEMIDE 40 MG PO TABS
40.0000 mg | ORAL_TABLET | Freq: Two times a day (BID) | ORAL | Status: DC
  Administered 2018-04-12 – 2018-04-13 (×2): 40 mg via ORAL
  Filled 2018-04-12 (×2): qty 1

## 2018-04-12 MED ORDER — LACTATED RINGERS IV SOLN
INTRAVENOUS | Status: DC
  Administered 2018-04-12: 1000 mL via INTRAVENOUS

## 2018-04-12 MED ORDER — PROPOFOL 10 MG/ML IV BOLUS
INTRAVENOUS | Status: DC | PRN
  Administered 2018-04-12: 200 mg via INTRAVENOUS

## 2018-04-12 MED ORDER — CEFAZOLIN SODIUM-DEXTROSE 2-4 GM/100ML-% IV SOLN
2.0000 g | INTRAVENOUS | Status: AC
  Administered 2018-04-12: 2 g via INTRAVENOUS
  Filled 2018-04-12: qty 100

## 2018-04-12 MED ORDER — STERILE WATER FOR IRRIGATION IR SOLN
Status: DC | PRN
  Administered 2018-04-12: 500 mL

## 2018-04-12 MED ORDER — DEXAMETHASONE SODIUM PHOSPHATE 10 MG/ML IJ SOLN
INTRAMUSCULAR | Status: AC
  Filled 2018-04-12: qty 1

## 2018-04-12 MED ORDER — 0.9 % SODIUM CHLORIDE (POUR BTL) OPTIME
TOPICAL | Status: DC | PRN
Start: 2018-04-12 — End: 2018-04-12
  Administered 2018-04-12: 1000 mL

## 2018-04-12 MED ORDER — CEFAZOLIN SODIUM-DEXTROSE 2-4 GM/100ML-% IV SOLN: 2.0000 g | INTRAVENOUS | Status: DC

## 2018-04-12 MED ORDER — ONDANSETRON HCL 4 MG/2ML IJ SOLN
INTRAMUSCULAR | Status: DC | PRN
  Administered 2018-04-12: 4 mg via INTRAVENOUS

## 2018-04-12 MED ORDER — LIDOCAINE 2% (20 MG/ML) 5 ML SYRINGE
INTRAMUSCULAR | Status: AC
  Filled 2018-04-12: qty 5

## 2018-04-12 MED ORDER — OXYCODONE HCL 5 MG PO CAPS: 5.0000 mg | ORAL_CAPSULE | ORAL | 0 refills | Status: AC | PRN

## 2018-04-12 MED ORDER — EPHEDRINE SULFATE 50 MG/ML IJ SOLN
INTRAMUSCULAR | Status: DC | PRN
  Administered 2018-04-12: 10 mg via INTRAVENOUS

## 2018-04-12 MED ORDER — PROPOFOL 10 MG/ML IV BOLUS
INTRAVENOUS | Status: AC
  Filled 2018-04-12: qty 20

## 2018-04-12 MED ORDER — INSULIN ASPART 100 UNIT/ML ~~LOC~~ SOLN
0.0000 [IU] | Freq: Three times a day (TID) | SUBCUTANEOUS | Status: DC
  Administered 2018-04-12: 5 [IU] via SUBCUTANEOUS
  Administered 2018-04-13: 2 [IU] via SUBCUTANEOUS

## 2018-04-12 MED ORDER — ACETAMINOPHEN 325 MG PO TABS
650.0000 mg | ORAL_TABLET | Freq: Four times a day (QID) | ORAL | Status: DC
  Administered 2018-04-12 – 2018-04-13 (×4): 650 mg via ORAL
  Filled 2018-04-12 (×4): qty 2

## 2018-04-12 MED ORDER — DOCUSATE SODIUM 100 MG PO CAPS
100.0000 mg | ORAL_CAPSULE | Freq: Two times a day (BID) | ORAL | Status: DC
  Filled 2018-04-12 (×2): qty 1

## 2018-04-12 MED ORDER — BUPIVACAINE HCL (PF) 0.5 % IJ SOLN
INTRAMUSCULAR | Status: AC
  Filled 2018-04-12: qty 30

## 2018-04-12 MED ORDER — FENTANYL CITRATE (PF) 250 MCG/5ML IJ SOLN
INTRAMUSCULAR | Status: AC
  Filled 2018-04-12: qty 5

## 2018-04-12 SURGICAL SUPPLY — 49 items
ADH SKN CLS APL DERMABOND .7 (GAUZE/BANDAGES/DRESSINGS) ×3
BAG URINE DRAINAGE (UROLOGICAL SUPPLIES) ×3 IMPLANT
BAG URINE LEG 500ML (DRAIN) ×1 IMPLANT
BANDAGE COBAN STERILE 2 (GAUZE/BANDAGES/DRESSINGS) ×2 IMPLANT
BLADE HEX COATED 2.75 (ELECTRODE) ×3 IMPLANT
BLADE SURG 15 STRL LF DISP TIS (BLADE) ×1 IMPLANT
BLADE SURG 15 STRL SS (BLADE) ×3
BNDG GAUZE ELAST 4 BULKY (GAUZE/BANDAGES/DRESSINGS) ×5 IMPLANT
CATH BONANNO SUPRAPUBIC 14G (CATHETERS) ×1 IMPLANT
CATH FOLEY 2WAY SLVR  5CC 14FR (CATHETERS) ×2
CATH FOLEY 2WAY SLVR  5CC 16FR (CATHETERS) ×2
CATH FOLEY 2WAY SLVR 5CC 14FR (CATHETERS) IMPLANT
CATH FOLEY 2WAY SLVR 5CC 16FR (CATHETERS) IMPLANT
COVER SURGICAL LIGHT HANDLE (MISCELLANEOUS) ×3 IMPLANT
DERMABOND ADVANCED (GAUZE/BANDAGES/DRESSINGS) ×6
DERMABOND ADVANCED .7 DNX12 (GAUZE/BANDAGES/DRESSINGS) ×1 IMPLANT
DRAIN PENROSE 18X1/4 LTX STRL (WOUND CARE) ×1 IMPLANT
DRAPE LAPAROTOMY T 98X78 PEDS (DRAPES) ×1 IMPLANT
ELECT PENCIL ROCKER SW 15FT (MISCELLANEOUS) ×2 IMPLANT
ELECT REM PT RETURN 15FT ADLT (MISCELLANEOUS) ×3 IMPLANT
GAUZE XEROFORM 1X8 LF (GAUZE/BANDAGES/DRESSINGS) ×4 IMPLANT
GLOVE BIOGEL M 8.0 STRL (GLOVE) ×3 IMPLANT
GOWN STRL REUS W/ TWL XL LVL3 (GOWN DISPOSABLE) ×1 IMPLANT
GOWN STRL REUS W/TWL XL LVL3 (GOWN DISPOSABLE) ×6 IMPLANT
KIT BASIN OR (CUSTOM PROCEDURE TRAY) ×3 IMPLANT
MANIFOLD NEPTUNE II (INSTRUMENTS) ×3 IMPLANT
NEEDLE HYPO 22GX1.5 SAFETY (NEEDLE) IMPLANT
NS IRRIG 1000ML POUR BTL (IV SOLUTION) ×1 IMPLANT
PACK CYSTO (CUSTOM PROCEDURE TRAY) ×3 IMPLANT
PACK GENERAL/GYN (CUSTOM PROCEDURE TRAY) ×1 IMPLANT
PLUG CATH AND CAP STER (CATHETERS) ×2 IMPLANT
SHEET LAVH (DRAPES) ×2 IMPLANT
SUPPORT SCROTAL LG STRP (MISCELLANEOUS) ×1 IMPLANT
SUPPORTER ATHLETIC LG (MISCELLANEOUS)
SUT CHROMIC 3 0 SH 27 (SUTURE) ×1 IMPLANT
SUT CHROMIC 4 0 SH 27 (SUTURE) IMPLANT
SUT ETHILON 3 0 FSL (SUTURE) ×3 IMPLANT
SUT MNCRL AB 4-0 PS2 18 (SUTURE) ×6 IMPLANT
SUT PROLENE 4 0 RB 1 (SUTURE)
SUT PROLENE 4-0 RB1 .5 CRCL 36 (SUTURE) ×1 IMPLANT
SUT VIC AB 2-0 UR5 27 (SUTURE) IMPLANT
SUT VIC AB 4-0 PS2 27 (SUTURE) ×1 IMPLANT
SUT VIC AB 4-0 SH 27 (SUTURE) ×15
SUT VIC AB 4-0 SH 27XBRD (SUTURE) IMPLANT
SUT VICRYL 0 TIES 12 18 (SUTURE) ×1 IMPLANT
SYR CONTROL 10ML LL (SYRINGE) IMPLANT
TOWEL OR 17X26 10 PK STRL BLUE (TOWEL DISPOSABLE) ×3 IMPLANT
WATER STERILE IRR 1000ML POUR (IV SOLUTION) ×1 IMPLANT
WATER STERILE IRR 3000ML UROMA (IV SOLUTION) ×1 IMPLANT

## 2018-04-12 NOTE — Anesthesia Procedure Notes (Signed)
Procedure Name: LMA Insertion Date/Time: 04/12/2018 9:42 AM Performed by: West Pugh, CRNA Pre-anesthesia Checklist: Patient identified, Emergency Drugs available, Suction available, Patient being monitored and Timeout performed Patient Re-evaluated:Patient Re-evaluated prior to induction Oxygen Delivery Method: Circle system utilized Preoxygenation: Pre-oxygenation with 100% oxygen Induction Type: IV induction Ventilation: Mask ventilation without difficulty LMA: LMA with gastric port inserted LMA Size: 5.0 Placement Confirmation: positive ETCO2 and breath sounds checked- equal and bilateral Tube secured with: Tape Dental Injury: Teeth and Oropharynx as per pre-operative assessment

## 2018-04-12 NOTE — Anesthesia Preprocedure Evaluation (Addendum)
Anesthesia Evaluation  Patient identified by MRN, date of birth, ID band Patient awake    Reviewed: Allergy & Precautions, NPO status , Patient's Chart, lab work & pertinent test results  Airway Mallampati: III  TM Distance: >3 FB Neck ROM: Full    Dental  (+) Upper Dentures, Partial Lower, Poor Dentition   Pulmonary sleep apnea , former smoker,    Pulmonary exam normal breath sounds clear to auscultation       Cardiovascular hypertension, Pt. on home beta blockers and Pt. on medications + CAD, + Past MI and + Peripheral Vascular Disease  Normal cardiovascular exam Rhythm:Regular Rate:Normal  ECG: NSR, rate 78  Sees cardiologist [name])   Neuro/Psych negative neurological ROS  negative psych ROS   GI/Hepatic negative GI ROS, Neg liver ROS,   Endo/Other  diabetes, Oral Hypoglycemic AgentsMorbid obesity  Renal/GU negative Renal ROS     Musculoskeletal negative musculoskeletal ROS (+)   Abdominal (+) + obese,   Peds  Hematology  (+) anemia , HLD   Anesthesia Other Findings ERODED ARTIFICIAL URINARY SPHINCTER  Reproductive/Obstetrics                            Anesthesia Physical Anesthesia Plan  ASA: III  Anesthesia Plan: General   Post-op Pain Management:    Induction: Intravenous  PONV Risk Score and Plan: 2 and Ondansetron, Dexamethasone and Treatment may vary due to age or medical condition  Airway Management Planned: LMA  Additional Equipment:   Intra-op Plan:   Post-operative Plan: Extubation in OR  Informed Consent: I have reviewed the patients History and Physical, chart, labs and discussed the procedure including the risks, benefits and alternatives for the proposed anesthesia with the patient or authorized representative who has indicated his/her understanding and acceptance.   Dental advisory given  Plan Discussed with: CRNA  Anesthesia Plan Comments:          Anesthesia Quick Evaluation

## 2018-04-12 NOTE — Op Note (Addendum)
Preoperative Diagnosis: Eroded AUS  Postoperative Diagnosis: Same  Procedure(s) Performed:  Procedure(s): EXPLANT OF ARTIFICIAL URINARY SPHINCTER  Surgeon:  Surgeon(s): Franchot Gallo, MD  Resident Surgeon: Alla Feeling, MD, MD  Anesthesia:  General  Fluids:  See anesthesia record  Estimated blood loss:  25 mL  Specimens:  1. Fully explanted AUS with all 3 components and tubing   Drains:  None  Complications:  None  Indications:  This is a 72 y.o. patient with a history of CaP and SUI s/p AUS. Recent cystoscopy revealed eroded device. No gross signs of infection.. After discussion of the risks & benefits and alternatives to surgical approach, the patient wishes to proceed with AUS explant. Risks & benefits of the procedure discussed with the patient, who wishes to proceed.   Findings:   - Uncomplicated explant of AUS and all components  - IPP cycled at end of procedure and easily inflated. Held rigidity with no evidence of leak   Procedure:  The patient was brought back to the operating room and general anesthesia with an endotracheal tube was administered.  The patient was positioned in high lithotomy with all pressure points padded.  His legs were placed in yellowfin stirrups with all pressure points padded to minimal tension.  His perineum, genitals, and lower abdomen were prepped with Betadine.  IV antibiotics were administered.  The patient was  draped in normal sterile fashion. A second timeout was performed.   A 14 French catheter was placed into the bladder after the AUS cycled and locked in position.  A 4 cm longitudinal incision was made in the perineum over the prior incision.  Using electrocautery we dissected down to the bulbospongiosus muscle.  The catheter and AUS palpated.  Using cut current we dissected directly over the AUS capsule.  The tab was free device delivered from around the urethra.  There is no obvious sign of infection or purulence. The tubing  attached to the cuff was cut.   Next a incision was made directly over AUS pump in the left hemiscrotum.  Using cut current we dissected onto the and delivered.  Again there is no signs of infection.  The tubing attached to the pump was then cut.   Next a 5 cm incision was made along the right lower abdominal fold just above the inguinal ring.  Using electrocautery we dissected down and palpated the reservoir tubing.  The tubing was then delivered and we dissected onto it proximally until it reached the fascia.  The fascia was incised and we delivered the PRB in its entirety.  At this point the fascia was closed with a single 2-0 Vicryl figure-of-eight fashion.   Next, through the scrotal incision we traced the tubing into the right inguinal region and were able to free the tubing as well as its proximal connection through our right inguinal incision.  This was subsequently removed.  At this point we removed all portions of the AUS device.  At this point all wounds were irrigated with antibiotic solution.  The right inguinal incision was closed in 2 layers using 4-0 Vicryl and the skin approximated with 4-0 Monocryl in a running fashion.  The scrotal wound was closed with 4-0 Vicryl the deep layer and a 2-0 Monocryl in a running fashion for the skin.  The perineal wound was closed in multiple layers using 4-0 Vicryl and 4-0 Monocryl was used to approximate the skin in running fashion.  At the end of the case the IPP was  cycled and inflated without difficulty.  It was able to hold rigidity with no evidence of leakage.  All wounds were dressed with Dermabond scrotal wrap was applied with Kerlix.  The catheter was left to drainage.  Plan: 1.  Admit for observation 2.  Continue IV antibiotics 3.  PRN oxycodone 4.  Continue Foley catheter for 10 to 14 days  I participated in all aspects of the above procedure and agree with the above note.

## 2018-04-12 NOTE — Interval H&P Note (Signed)
History and Physical Interval Note:  04/12/2018 9:25 AM  Theodore [name]  has presented today for surgery, with the diagnosis of ERODED ARTIFICIAL URINARY SPHINCTER  The various methods of treatment have been discussed with the patient and family. After consideration of risks, benefits and other options for treatment, the patient has consented to  Procedure(s): EXPLANT OF ARTIFICIAL URINARY SPHINCTER (N/A) INSERTION OF SUPRAPUBIC CATHETER (N/A) as a surgical intervention .  The patient's history has been reviewed, patient examined, no change in status, stable for surgery.  I have reviewed the patient's chart and labs.  Questions were answered to the patient's satisfaction.     Lillette Boxer Gatlyn Lipari

## 2018-04-12 NOTE — Anesthesia Postprocedure Evaluation (Signed)
Anesthesia Post Note  Patient: Theodore [name]  Procedure(s) Performed: EXPLANT OF ARTIFICIAL URINARY SPHINCTER (N/A )     Patient location during evaluation: PACU Anesthesia Type: General Level of consciousness: awake and alert Pain management: pain level controlled Vital Signs Assessment: post-procedure vital signs reviewed and stable Respiratory status: spontaneous breathing, nonlabored ventilation, respiratory function stable and patient connected to nasal cannula oxygen Cardiovascular status: blood pressure returned to baseline and stable Postop Assessment: no apparent nausea or vomiting Anesthetic complications: no    Last Vitals:  Vitals:   04/12/18 1400 04/12/18 1422  BP: (!) 142/73 140/73  Pulse: 93 95  Resp: 18 (!) 22  Temp: (!) 36.4 C 36.6 C  SpO2: 94% 95%    Last Pain:  Vitals:   04/12/18 1422  TempSrc: Oral  PainSc:                  Jennafer Gladue P Quintessa Simmerman

## 2018-04-12 NOTE — Transfer of Care (Signed)
Immediate Anesthesia Transfer of Care Note  Patient: Theodore [name]  Procedure(s) Performed: Floria Raveling OF ARTIFICIAL URINARY SPHINCTER (N/A )  Patient Location: PACU  Anesthesia Type:General  Level of Consciousness: alert , drowsy and patient cooperative  Airway & Oxygen Therapy: Patient Spontanous Breathing and Patient connected to face mask oxygen  Post-op Assessment: Report given to RN and Post -op Vital signs reviewed and stable  Post vital signs: Reviewed and stable  Last Vitals:  Vitals Value Taken Time  BP 167/84 04/12/2018 11:31 AM  Temp    Pulse 93 04/12/2018 11:34 AM  Resp 19 04/12/2018 11:34 AM  SpO2 100 % 04/12/2018 11:34 AM  Vitals shown include unvalidated device data.  Last Pain:  Vitals:   04/12/18 0758  TempSrc:   PainSc: 0-No pain      Patients Stated Pain Goal: 3 (03/88/82 8003)  Complications: No apparent anesthesia complications

## 2018-04-12 NOTE — H&P (Signed)
H&P  Chief Complaint: Eroded penile prosthesis  History of Present Illness: 72 year old male presents for explant of an artificial urinary sphincter. He initially presented several weeks ago with obvious infection of his AUS with swelling/induration in his perineum and around the scrotal pump. HE was admitted to the hospital for abx mgmt and subsequently had improvement in his status. Erosion has been confirmed both cystoscopically and radiographically with RUG.  Past Medical History:  Diagnosis Date  . CAD (coronary artery disease)   . Cellulitis 10/18-20/2012   RLE   . Diabetes mellitus    controlled; type 2   . Diverticulitis 02/19/13  . Gallstones   . Hyperlipidemia   . Hypertension   . Myocardial infarction ([place])    inferior wall; "silent heart attack"   . Paroxysmal nocturnal dyspnea   . Prostate cancer [facility])    prostate  . PVD (peripheral vascular disease) ([place])     Past Surgical History:  Procedure Laterality Date  . [place]  . CHOLECYSTECTOMY    . PENILE PROSTHESIS  REMOVAL     swich(sphincter)...removed 04-2008, replaced 02-2009  . PROSTATE SURGERY    . TEE WITHOUT CARDIOVERSION N/A 06/01/2016   Procedure: TRANSESOPHAGEAL ECHOCARDIOGRAM (TEE);  Surgeon: Sanda Klein, MD;  Location: [facility] ENDOSCOPY;  Service: Cardiovascular;  Laterality: N/A;  . TONSILLECTOMY      Home Medications:  Allergies as of 04/12/2018      Reactions   Sulfonamide Derivatives Other (See Comments)   "reaction" as infant according to his mother      Medication List    Notice   Cannot display discharge medications because the patient has not yet been admitted.     Allergies:  Allergies  Allergen Reactions  . Sulfonamide Derivatives Other (See Comments)    "reaction" as infant according to his mother    Family History  Problem Relation Age of Onset  . Pneumonia Mother   . Alcoholism Mother   . Seizures Father   . Stroke Father   . Diabetes Father   . Heart  disease Father   . Gout Brother   . Kidney disease Brother        kidney failure   . Diabetes Brother     Social History:  reports that he quit smoking about 31 years ago. His smoking use included cigarettes. He quit after 26.00 years of use. He has never used smokeless tobacco. He reports that he does not drink alcohol or use drugs.  ROS: A complete review of systems was performed.  All systems are negative except for pertinent findings as noted.  Physical Exam:  Vital signs in last 24 hours:   Constitutional:  Alert and oriented, No acute distress Cardiovascular: Regular rate and rhythm, No JVD Respiratory: Normal respiratory effort, Lungs clear bilaterally GI: Abdomen is obese, soft, nontender, nondistended, no abdominal masses. WHSS lower midline. Genitourinary: No CVAT. Normal male phallus, testes are descended bilaterally and non-tender and without masses, scrotum is normal in appearance without lesions or masses, perineum is normal on inspection.  Lymphatic: No lymphadenopathy. Bilateral LE edema Neurologic: Grossly intact, no focal deficits Psychiatric: Normal mood and affect  Laboratory Data:  Recent Labs    04/10/18 1508  WBC 8.8  HGB 11.6*  HCT 35.6*  PLT 299    Recent Labs    04/10/18 1508  NA 140  K 4.1  CL 101  GLUCOSE 105*  BUN 29*  CALCIUM 9.2  CREATININE 1.40*  No results found for this or any previous visit (from the past 24 hour(s)). No results found for this or any previous visit (from the past 240 hour(s)).  Renal Function: Recent Labs    04/10/18 1508  CREATININE 1.40*   Estimated Creatinine Clearance: 53.4 mL/min (A) (by C-G formula based on SCr of 1.4 mg/dL (H)).  Radiologic Imaging: No results found.  Impression/Assessment:  Eroded AUS  Plan:  Explant of AUS

## 2018-04-12 NOTE — Plan of Care (Signed)
Patient admitted to [place] from PACU, VSS.  Patient denies pain.  Dressings dry and intact, no orders for dressing changes at this time.  Foley catheter draining clear yellow urine.

## 2018-04-13 ENCOUNTER — Encounter (HOSPITAL_COMMUNITY): Payer: Self-pay | Admitting: Urology

## 2018-04-13 DIAGNOSIS — T83191A Other mechanical complication of urinary sphincter implant, initial encounter: Secondary | ICD-10-CM | POA: Diagnosis not present

## 2018-04-13 LAB — CBC
HEMATOCRIT: 31.8 % — AB (ref 39.0–52.0)
HEMOGLOBIN: 10.4 g/dL — AB (ref 13.0–17.0)
MCH: 31.2 pg (ref 26.0–34.0)
MCHC: 32.7 g/dL (ref 30.0–36.0)
MCV: 95.5 fL (ref 78.0–100.0)
Platelets: 282 10*3/uL (ref 150–400)
RBC: 3.33 MIL/uL — AB (ref 4.22–5.81)
RDW: 14.6 % (ref 11.5–15.5)
WBC: 11 10*3/uL — ABNORMAL HIGH (ref 4.0–10.5)

## 2018-04-13 LAB — BASIC METABOLIC PANEL
ANION GAP: 6 (ref 5–15)
BUN: 25 mg/dL — ABNORMAL HIGH (ref 6–20)
CO2: 28 mmol/L (ref 22–32)
Calcium: 8.3 mg/dL — ABNORMAL LOW (ref 8.9–10.3)
Chloride: 107 mmol/L (ref 101–111)
Creatinine, Ser: 1.4 mg/dL — ABNORMAL HIGH (ref 0.61–1.24)
GFR, EST AFRICAN AMERICAN: 56 mL/min — AB (ref >60)
GFR, EST NON AFRICAN AMERICAN: 48 mL/min — AB (ref >60)
GLUCOSE: 169 mg/dL — AB (ref 65–99)
POTASSIUM: 4.5 mmol/L (ref 3.5–5.1)
Sodium: 141 mmol/L (ref 135–145)

## 2018-04-13 LAB — GLUCOSE, CAPILLARY: GLUCOSE-CAPILLARY: 145 mg/dL — AB (ref 65–99)

## 2018-04-13 NOTE — Discharge Instructions (Signed)
1. OK to shower starting tomorrow  2. Report increased pain/swelling  3. We will call to schedule followup

## 2018-04-13 NOTE — Progress Notes (Signed)
Pt to be discharged home today. Pt and Wife given discharge instructions and Medications schedules. Pt to be discharged with Foley Catheter and to follow up with Urology. Foley to leg bag demonstration and Wife able to return demonstration. During review of Medications Pt states that he does not have any Oxycodone 5 mg at home. RN Spoke to MD's Nurse at the office and updated her and MD will be made aware and Prescription to be called to Pt's Pharmacy. Pt and Wife verbalized understanding of all discharge instructions.

## 2018-04-16 NOTE — Discharge Summary (Signed)
Patient ID: Theodore [name] MRN: [id number] DOB/AGE: [birth year] 72 y.o.  Admit date: 04/12/2018 Discharge date: 04/16/2018  Primary Care Physician:  Theodore Lima, MD  Discharge Diagnoses: Cuff erosion of artificial urinary sphincter ([place])  Admit Diagnosis:. Cuff erosion of artificial urinary sphincter [facility])   Discharge Medications: Allergies as of 04/13/2018      Reactions   Sulfonamide Derivatives Other (See Comments)   "reaction" as infant according to his mother      Medication List    TAKE these medications   aspirin EC 81 MG tablet Take 1 tablet (81 mg total) by mouth daily. Start taking on:  04/19/2018 What changed:  These instructions start on 04/19/2018. If you are unsure what to do until then, ask your doctor or other care provider. Notes to patient:  This medication will start on 04/19/2018. Take 81 mg Asprin EC(enteric coated) Daily    atorvastatin 20 MG tablet Commonly known as:  LIPITOR Take 1 tablet (20 mg total) by mouth daily.   carvedilol 6.25 MG tablet Commonly known as:  COREG take 1 tablet by mouth twice a day WITH A MEAL Notes to patient:  Last dose of this Medication was given on 04/13/2018 at 801 am   CENTRUM SILVER 50+MEN PO Take 1 tablet by mouth daily.   cephALEXin 500 MG capsule Commonly known as:  KEFLEX Take 500 mg by mouth as directed.   colestipol 5 g granules Commonly known as:  COLESTID Take 5 g by mouth daily with supper.   furosemide 40 MG tablet Commonly known as:  LASIX Take 40 mg by mouth 2 (two) times daily. Notes to patient:  Last Dose of this Medication was given on 04/13/2018 at 801 am   losartan 100 MG tablet Commonly known as:  COZAAR Take 0.5 tablets (50 mg total) by mouth daily.   LUPRON DEPOT (60-MONTH) IM Inject 1 each into the muscle every 6 (six) months.   metFORMIN 500 MG tablet Commonly known as:  GLUCOPHAGE Take 1 tablet (500 mg total) by mouth 2 (two) times daily with a meal. Notes to patient:  Rsume this  Medication on 04/13/2018 at dinner time   omega-3 acid ethyl esters 1 g capsule Commonly known as:  LOVAZA Take 2 capsules (2 g total) by mouth 2 (two) times daily.   oxycodone 5 MG capsule Commonly known as:  OXY-IR Take 1-2 capsules (5-10 mg total) by mouth every 4 (four) hours as needed.   potassium chloride 10 MEQ tablet Commonly known as:  K-DUR Take 1 tablet (10 mEq total) by mouth daily. F/u appt is due must make appt for future refills        Significant Diagnostic Studies:  No results found.  Brief H and P: For complete details please refer to admission H and P, but in brief [name] is admitted for surgical excision of an eroded AUS. Hospital Course:  [name] had an uncomplicated surgical procedure and postop stay. HE was d/c'ed on POD 1. Active Problems:   Cuff erosion of artificial urinary sphincter (HCC)   Day of Discharge BP 128/67 (BP Location: Right Arm)   Pulse 83   Temp 97.7 F (36.5 C) (Oral)   Resp 18   Ht 5\' 5"  (1.651 m)   Wt 108.4 kg (239 lb)   SpO2 94%   BMI 39.77 kg/m   No results found for this or any previous visit (from the past 24 hour(s)).  Physical Exam: General: Alert and awake oriented  x3 not in any acute distress. HEENT: anicteric sclera, pupils reactive to light and accommodation CVS: S1-S2 clear no murmur rubs or gallops Chest: clear to auscultation bilaterally, no wheezing rales or rhonchi Abdomen: soft nontender, nondistended, normal bowel sounds, no organomegaly Extremities: no cyanosis, clubbing or edema noted bilaterally Neuro: Cranial nerves II-XII intact, no focal neurological deficits  Disposition:  Home  Diet:  No restrictions  Activity:  Gradually increase   Disposition and Follow-up:    We will arrange followup    [place]    Theodore Gallo, MD Follow up.   Specialty:  Urology Why:  we will call  you to set up Contact information: [place]  [phone]           Time spent on Discharge:  15 mins  Signed: Lillette Boxer Chevie [name] 04/16/2018, 6:43 AM

## 2018-04-23 ENCOUNTER — Other Ambulatory Visit: Payer: Self-pay | Admitting: Urology

## 2018-04-23 ENCOUNTER — Ambulatory Visit: Payer: Medicare Other | Admitting: Cardiology

## 2018-04-23 ENCOUNTER — Encounter

## 2018-04-23 DIAGNOSIS — T83591A Infection and inflammatory reaction due to implanted urinary sphincter, initial encounter: Secondary | ICD-10-CM

## 2018-05-01 ENCOUNTER — Ambulatory Visit (HOSPITAL_COMMUNITY): Admission: RE | Admit: 2018-05-01 | Payer: Medicare Other | Source: Ambulatory Visit

## 2018-05-03 ENCOUNTER — Telehealth: Payer: Self-pay | Admitting: Internal Medicine

## 2018-05-03 NOTE — Telephone Encounter (Signed)
Patient is deceased. Death certificate was taken to [facility].

## 2018-05-14 DEATH — deceased

## 2018-06-20 ENCOUNTER — Ambulatory Visit: Payer: Medicare Other | Admitting: Podiatry

## 2018-06-26 ENCOUNTER — Encounter: Payer: Self-pay | Admitting: Internal Medicine

## 2018-07-05 ENCOUNTER — Ambulatory Visit: Payer: Medicare Other | Admitting: Cardiovascular Disease

## 2018-08-09 ENCOUNTER — Ambulatory Visit: Payer: Medicare Other | Admitting: Oncology
